# Patient Record
Sex: Female | Born: 1950 | Hispanic: No | Marital: Married | Smoking: Never smoker
Health system: Southern US, Community
[De-identification: ages and names within clinical notes are randomized; demographics above are authoritative.]

## PROBLEM LIST (undated history)

## (undated) DIAGNOSIS — E785 Hyperlipidemia, unspecified: Secondary | ICD-10-CM

## (undated) DIAGNOSIS — R609 Edema, unspecified: Secondary | ICD-10-CM

## (undated) DIAGNOSIS — R062 Wheezing: Secondary | ICD-10-CM

## (undated) DIAGNOSIS — J302 Other seasonal allergic rhinitis: Secondary | ICD-10-CM

## (undated) DIAGNOSIS — I10 Essential (primary) hypertension: Secondary | ICD-10-CM

## (undated) DIAGNOSIS — J45909 Unspecified asthma, uncomplicated: Secondary | ICD-10-CM

## (undated) DIAGNOSIS — I429 Cardiomyopathy, unspecified: Secondary | ICD-10-CM

## (undated) DIAGNOSIS — G4730 Sleep apnea, unspecified: Secondary | ICD-10-CM

## (undated) DIAGNOSIS — K219 Gastro-esophageal reflux disease without esophagitis: Secondary | ICD-10-CM

## (undated) DIAGNOSIS — G5600 Carpal tunnel syndrome, unspecified upper limb: Secondary | ICD-10-CM

## (undated) DIAGNOSIS — R600 Localized edema: Secondary | ICD-10-CM

## (undated) DIAGNOSIS — E119 Type 2 diabetes mellitus without complications: Secondary | ICD-10-CM

## (undated) DIAGNOSIS — R42 Dizziness and giddiness: Secondary | ICD-10-CM

## (undated) DIAGNOSIS — E079 Disorder of thyroid, unspecified: Secondary | ICD-10-CM

## (undated) HISTORY — DX: Type 2 diabetes mellitus without complications: E11.9

## (undated) HISTORY — PX: HERNIA REPAIR: SHX51

## (undated) HISTORY — DX: Disorder of thyroid, unspecified: E07.9

## (undated) HISTORY — DX: Hyperlipidemia, unspecified: E78.5

## (undated) HISTORY — PX: CHOLECYSTECTOMY: SHX55

---

## 2007-07-17 ENCOUNTER — Other Ambulatory Visit: Payer: Self-pay

## 2007-07-17 ENCOUNTER — Inpatient Hospital Stay: Payer: Self-pay | Admitting: Internal Medicine

## 2011-12-13 ENCOUNTER — Other Ambulatory Visit: Payer: Self-pay | Admitting: Internal Medicine

## 2011-12-13 DIAGNOSIS — Z1231 Encounter for screening mammogram for malignant neoplasm of breast: Secondary | ICD-10-CM

## 2012-01-11 ENCOUNTER — Ambulatory Visit
Admission: RE | Admit: 2012-01-11 | Discharge: 2012-01-11 | Disposition: A | Discharge status: Home / Self Care | Payer: Self-pay | Source: Ambulatory Visit | Attending: Internal Medicine | Admitting: Internal Medicine

## 2012-01-11 DIAGNOSIS — Z1231 Encounter for screening mammogram for malignant neoplasm of breast: Secondary | ICD-10-CM

## 2012-01-16 ENCOUNTER — Other Ambulatory Visit: Payer: Self-pay | Admitting: Internal Medicine

## 2012-01-16 DIAGNOSIS — R928 Other abnormal and inconclusive findings on diagnostic imaging of breast: Secondary | ICD-10-CM

## 2012-01-24 ENCOUNTER — Encounter (HOSPITAL_COMMUNITY): Payer: Self-pay | Admitting: *Deleted

## 2012-01-24 ENCOUNTER — Ambulatory Visit
Admission: RE | Admit: 2012-01-24 | Discharge: 2012-01-24 | Disposition: A | Discharge status: Home / Self Care | Payer: Self-pay | Source: Ambulatory Visit | Attending: Internal Medicine | Admitting: Internal Medicine

## 2012-01-24 DIAGNOSIS — R928 Other abnormal and inconclusive findings on diagnostic imaging of breast: Secondary | ICD-10-CM

## 2012-02-02 ENCOUNTER — Encounter (HOSPITAL_COMMUNITY): Payer: Self-pay

## 2012-02-02 ENCOUNTER — Other Ambulatory Visit: Payer: Self-pay | Admitting: Obstetrics and Gynecology

## 2012-02-02 ENCOUNTER — Ambulatory Visit (HOSPITAL_COMMUNITY)
Admission: RE | Admit: 2012-02-02 | Discharge: 2012-02-02 | Disposition: A | Discharge status: Home / Self Care | Payer: Self-pay | Source: Ambulatory Visit | Attending: Obstetrics and Gynecology | Admitting: Obstetrics and Gynecology

## 2012-02-02 DIAGNOSIS — Z01419 Encounter for gynecological examination (general) (routine) without abnormal findings: Secondary | ICD-10-CM

## 2012-02-02 DIAGNOSIS — R928 Other abnormal and inconclusive findings on diagnostic imaging of breast: Secondary | ICD-10-CM

## 2012-02-02 HISTORY — DX: Essential (primary) hypertension: I10

## 2012-02-02 NOTE — Progress Notes (Signed)
Patient referred to [facility] from the [facility] due to needing additional imaging of both breasts. Screening mammogram completed 01/11/2012 at the [facility] of [place].  Pap Smear:    Pap smear completed today. Per patient no history of having a Pap smear and today's Pap smear was her first Pap smear. No Pap smear results in EPIC.  Physical exam: Breasts Breasts symmetrical. No skin abnormalities left breast. Bump on skin on the right breast at 11 o'clock. Per patient that area has been there for a long time and has not changed. No nipple retraction bilateral breasts. No nipple discharge bilateral breasts. No lymphadenopathy. No lumps palpated bilateral breasts. Patient complained of pain in left upper outer breast at the border of the axilla.   Patient referred to the [facility] for bilateral Diagnostic Mammogram per recommendation and possible bilateral breast ultrasound. Appointment scheduled for Tuesday, February 13, 2012 at 0850.             Pelvic/Bimanual   Ext Genitalia No lesions, no swelling and no discharge observed on external genitalia.         Vagina Vagina pink and normal texture. No lesions or discharge observed in vagina.          Cervix Cervix is present. Cervix pink and of normal texture. No discharge observed.     Uterus Uterus is present and palpable. Uterus in normal position and normal size.        Adnexae Bilateral ovaries present and unable to palpate. No tenderness on palpation.          Rectovaginal No rectal exam completed today since patient had no rectal complaints. No skin abnormalities observed on exam.

## 2012-02-02 NOTE — Patient Instructions (Signed)
Taught patient how to perform BSE and gave educational materials to take home. Let her know [facility]CP will cover Pap smears every 3 years unless has a history of abnormal Pap smears but since she has never had a Pap smear before that if today's Pap smear is normal her next Pap smear is due 2-3 years. Patient referred to the [facility] for bilateral Diagnostic Mammogram per recommendation and possible bilateral breast ultrasound. Appointment scheduled for Tuesday, February 13, 2012 at 0850.  Patient aware of appointment and will be there. Let patient know will follow up with her within the next couple weeks with results by letter or phone. Patient verbalized understanding.

## 2012-02-13 ENCOUNTER — Other Ambulatory Visit: Payer: Self-pay | Admitting: Obstetrics and Gynecology

## 2012-02-13 ENCOUNTER — Ambulatory Visit
Admission: RE | Admit: 2012-02-13 | Discharge: 2012-02-13 | Disposition: A | Discharge status: Home / Self Care | Payer: No Typology Code available for payment source | Source: Ambulatory Visit | Attending: Obstetrics and Gynecology | Admitting: Obstetrics and Gynecology

## 2012-02-13 DIAGNOSIS — R928 Other abnormal and inconclusive findings on diagnostic imaging of breast: Secondary | ICD-10-CM

## 2012-02-29 NOTE — Addendum Note (Signed)
Encounter addended by: Saintclair Halsted, RN on: 02/29/2012  4:53 PM<BR>     Documentation filed: Visit Diagnoses, Charges VN

## 2012-04-19 ENCOUNTER — Ambulatory Visit: Payer: Self-pay | Admitting: Internal Medicine

## 2012-04-22 ENCOUNTER — Encounter (HOSPITAL_COMMUNITY): Payer: Self-pay | Admitting: *Deleted

## 2013-12-29 ENCOUNTER — Encounter (HOSPITAL_COMMUNITY): Payer: Self-pay

## 2014-07-01 ENCOUNTER — Emergency Department (INDEPENDENT_AMBULATORY_CARE_PROVIDER_SITE_OTHER): Payer: Self-pay

## 2014-07-01 ENCOUNTER — Emergency Department (INDEPENDENT_AMBULATORY_CARE_PROVIDER_SITE_OTHER)
Admission: EM | Admit: 2014-07-01 | Discharge: 2014-07-01 | Disposition: A | Discharge status: Home / Self Care | Payer: Self-pay | Source: Home / Self Care | Attending: Family Medicine | Admitting: Family Medicine

## 2014-07-01 ENCOUNTER — Encounter (HOSPITAL_COMMUNITY): Payer: Self-pay | Admitting: Emergency Medicine

## 2014-07-01 DIAGNOSIS — M9262 Juvenile osteochondrosis of tarsus, left ankle: Secondary | ICD-10-CM

## 2014-07-01 DIAGNOSIS — M1711 Unilateral primary osteoarthritis, right knee: Secondary | ICD-10-CM | POA: Diagnosis present

## 2014-07-01 NOTE — ED Notes (Signed)
C/o right knee pain onset 1 month and swelling Also reports bilateral foot swelling and right foot numbness Alert, no signs of acute distress.

## 2014-07-01 NOTE — ED Provider Notes (Signed)
Brianna Gray is a 64 y.o. female who presents to Urgent Care today for right knee pain. Patient has a one-month history of medial right knee pain. Pain has occurred without injury. Pain is worse with activity. No radiating pain weakness.   She does note some tingling to her right foot without significant numbness. Additionally she notes swelling at the left posterior calcaneus this is associated with pain and tenderness. This is worse with activity. No fevers or chills nausea vomiting or diarrhea.  She has tried some Tylenol and ibuprofen which helped for her pain. Additionally she's been doing some physical therapy exercises at home that seemed to help some as well.   History reviewed. No pertinent past medical history. History reviewed. No pertinent past surgical history. History  Substance Use Topics  . Smoking status: Never Smoker   . Smokeless tobacco: Not on file  . Alcohol Use: No   ROS as above Medications: No current facility-administered medications for this encounter.   No current outpatient prescriptions on file.   No Known Allergies   Exam:  BP 155/83 mmHg  Pulse 70  Temp(Src) 97 F (36.1 C) (Oral)  Resp 16  SpO2 95% Gen: Well NAD obese HEENT: EOMI,  MMM Lungs: Normal work of breathing. CTABL Heart: RRR no MRG Abd: NABS, Soft. Nondistended, Nontender Exts: Brisk capillary refill, warm and well perfused.  Right knee normal-appearing no effusion Range of motion 0-120 Tender to palpation anterior medial joint line. Stable ligamentous exam. Positive medial McMurray's test. Right foot normal-appearing nontender normal sensation pulses capillary refill  Left knee normal-appearing nontender normal motion Stable ligamentous exam. Negative McMurray's test Left foot swelling of the posterior calcaneus with tenderness. Foot motion is normal Refill pulses and sensation are intact   No results found for this or any previous visit (from the past 24 hour(s)). Dg Knee  Complete 4 Views Right  07/01/2014   CLINICAL DATA:  Right knee pain  EXAM: RIGHT KNEE - COMPLETE 4+ VIEW  COMPARISON:  None.  FINDINGS: Moderate to advanced degenerative change in the medial joint compartment with joint space narrowing and spurring. Mild degenerative change laterally with widening of the joint space. Moderate patellofemoral degenerative change. Small joint effusion  Negative for acute fracture.  IMPRESSION: Moderate to advanced degenerative changes in the knee especially the medial compartment. No acute bony abnormality.   Electronically Signed   By: Franchot Gallo M.D.   On: 07/01/2014 11:36   Dg Foot Complete Left  07/01/2014   CLINICAL DATA:  Pain and swelling heel  EXAM: LEFT FOOT - COMPLETE 3+ VIEW  COMPARISON:  None.  FINDINGS: Negative for fracture or mass.  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 Probable overlying bursitis. Calcification in the plantar surface of the calcaneus also noted.  IMPRESSION: No acute bony abnormality  Extensive calcification of the Achilles tendon insertion and adjacent soft tissues.   Electronically Signed   By: Franchot Gallo M.D.   On: 07/01/2014 11:55    Assessment and Plan: 64 y.o. female with  1) right knee pain: Patient has significant medial compartment DJD.  She is reassured by this diagnosis. She was expecting a diagnosis of arthritis. She would like to defer any corticosteroid shot if possible and do trial of home exercises as well as Tylenol or ibuprofen. She will return as needed 2) left heel swelling: Haglund's deformity. She is again reassured by this diagnosis and will do conservative measures.  Discussed warning signs or symptoms. Please see discharge instructions. Patient expresses understanding.     Gregor Hams, MD 07/01/14 1215

## 2014-07-01 NOTE — Discharge Instructions (Signed)
Thank you for coming in today. Take Tylenol on a regular basis. Use ibuprofen for severe pain Continue leg exercises. We will be happy to do a cortisone shot if your knee pain gets worse  Osteoarthritis Osteoarthritis is a disease that causes soreness and inflammation of a joint. It occurs when the cartilage at the affected joint wears down. Cartilage acts as a cushion, covering the ends of bones where they meet to form a joint. Osteoarthritis is the most common form of arthritis. It often occurs in older people. The joints affected most often by this condition include those in the:  Ends of the fingers.  Thumbs.  Neck.  Lower back.  Knees.  Hips. CAUSES  Over time, the cartilage that covers the ends of bones begins to wear away. This causes bone to rub on bone, producing pain and stiffness in the affected joints.  RISK FACTORS Certain factors can increase your chances of having osteoarthritis, including:  Older age.  Excessive body weight.  Overuse of joints.  Previous joint injury. SIGNS AND SYMPTOMS   Pain, swelling, and stiffness in the joint.  Over time, the joint may lose its normal shape.  Small deposits of bone (osteophytes) may grow on the edges of the joint.  Bits of bone or cartilage can break off and float inside the joint space. This may cause more pain and damage. DIAGNOSIS  Your health care provider will do a physical exam and ask about your symptoms. Various tests may be ordered, such as:  X-rays of the affected joint.  An MRI scan.  Blood tests to rule out other types of arthritis.  Joint fluid tests. This involves using a needle to draw fluid from the joint and examining the fluid under a microscope. TREATMENT  Goals of treatment are to control pain and improve joint function. Treatment plans may include:  A prescribed exercise program that allows for rest and joint relief.  A weight control plan.  Pain relief techniques, such  as:  Properly applied heat and cold.  Electric pulses delivered to nerve endings under the skin (transcutaneous electrical nerve stimulation [TENS]).  Massage.  Certain nutritional supplements.  Medicines to control pain, such as:  Acetaminophen.  Nonsteroidal anti-inflammatory drugs (NSAIDs), such as naproxen.  Narcotic or central-acting agents, such as tramadol.  Corticosteroids. These can be given orally or as an injection.  Surgery to reposition the bones and relieve pain (osteotomy) or to remove loose pieces of bone and cartilage. Joint replacement may be needed in advanced states of osteoarthritis. HOME CARE INSTRUCTIONS   Take medicines only as directed by your health care provider.  Maintain a healthy weight. Follow your health care provider's instructions for weight control. This may include dietary instructions.  Exercise as directed. Your health care provider can recommend specific types of exercise. These may include:  Strengthening exercises. These are done to strengthen the muscles that support joints affected by arthritis. They can be performed with weights or with exercise bands to add resistance.  Aerobic activities. These are exercises, such as brisk walking or low-impact aerobics, that get your heart pumping.  Range-of-motion activities. These keep your joints limber.  Balance and agility exercises. These help you maintain daily living skills.  Rest your affected joints as directed by your health care provider.  Keep all follow-up visits as directed by your health care provider. SEEK MEDICAL CARE IF:   Your skin turns red.  You develop a rash in addition to your joint pain.  You have  worsening joint pain.  You have a fever along with joint or muscle aches. SEEK IMMEDIATE MEDICAL CARE IF:  You have a significant loss of weight or appetite.  You have night sweats. [place] of Arthritis and Musculoskeletal and  Skin Diseases: www.niams.SouthExposed.es  [employer] on Aging: http://kim-miller.com/  American College of Rheumatology: www.rheumatology.org Document Released: 02/13/2005 Document Revised: 06/30/2013 Document Reviewed: 10/21/2012 [facility] Patient Information 2015 [place], [place]. This information is not intended to replace advice given to you by your health care provider. Make sure you discuss any questions you have with your health care provider.

## 2014-07-03 ENCOUNTER — Encounter (HOSPITAL_COMMUNITY): Payer: Self-pay

## 2015-01-04 ENCOUNTER — Other Ambulatory Visit: Payer: Self-pay | Admitting: Internal Medicine

## 2015-01-05 ENCOUNTER — Other Ambulatory Visit: Payer: Self-pay | Admitting: Internal Medicine

## 2015-01-05 DIAGNOSIS — Z1231 Encounter for screening mammogram for malignant neoplasm of breast: Secondary | ICD-10-CM

## 2015-01-14 ENCOUNTER — Ambulatory Visit: Payer: Self-pay | Attending: Internal Medicine

## 2015-01-28 ENCOUNTER — Ambulatory Visit
Admission: RE | Admit: 2015-01-28 | Discharge: 2015-01-28 | Disposition: A | Discharge status: Home / Self Care | Payer: Medicaid Other | Source: Ambulatory Visit | Attending: Internal Medicine | Admitting: Internal Medicine

## 2015-01-28 DIAGNOSIS — Z1231 Encounter for screening mammogram for malignant neoplasm of breast: Secondary | ICD-10-CM | POA: Insufficient documentation

## 2015-03-09 ENCOUNTER — Ambulatory Visit
Admission: RE | Admit: 2015-03-09 | Discharge: 2015-03-09 | Disposition: A | Discharge status: Home / Self Care | Payer: Medicaid Other | Source: Ambulatory Visit | Attending: Obstetrics & Gynecology | Admitting: Obstetrics & Gynecology

## 2015-03-09 ENCOUNTER — Encounter
Admission: RE | Admit: 2015-03-09 | Discharge: 2015-03-09 | Disposition: A | Discharge status: Home / Self Care | Payer: Medicaid Other | Source: Ambulatory Visit | Attending: Obstetrics & Gynecology | Admitting: Obstetrics & Gynecology

## 2015-03-09 DIAGNOSIS — Z9889 Other specified postprocedural states: Secondary | ICD-10-CM | POA: Diagnosis not present

## 2015-03-09 DIAGNOSIS — J45909 Unspecified asthma, uncomplicated: Secondary | ICD-10-CM | POA: Diagnosis not present

## 2015-03-09 DIAGNOSIS — Z01811 Encounter for preprocedural respiratory examination: Secondary | ICD-10-CM

## 2015-03-09 DIAGNOSIS — Z01810 Encounter for preprocedural cardiovascular examination: Secondary | ICD-10-CM | POA: Diagnosis present

## 2015-03-09 DIAGNOSIS — E669 Obesity, unspecified: Secondary | ICD-10-CM | POA: Insufficient documentation

## 2015-03-09 DIAGNOSIS — I10 Essential (primary) hypertension: Secondary | ICD-10-CM | POA: Insufficient documentation

## 2015-03-09 DIAGNOSIS — Z01812 Encounter for preprocedural laboratory examination: Secondary | ICD-10-CM | POA: Diagnosis present

## 2015-03-09 DIAGNOSIS — Z6841 Body Mass Index (BMI) 40.0 and over, adult: Secondary | ICD-10-CM | POA: Diagnosis not present

## 2015-03-09 DIAGNOSIS — N938 Other specified abnormal uterine and vaginal bleeding: Secondary | ICD-10-CM | POA: Diagnosis present

## 2015-03-09 DIAGNOSIS — E039 Hypothyroidism, unspecified: Secondary | ICD-10-CM | POA: Diagnosis not present

## 2015-03-09 HISTORY — DX: Carpal tunnel syndrome, unspecified upper limb: G56.00

## 2015-03-09 HISTORY — DX: Dizziness and giddiness: R42

## 2015-03-09 HISTORY — DX: Hyperlipidemia, unspecified: E78.5

## 2015-03-09 HISTORY — DX: Other seasonal allergic rhinitis: J30.2

## 2015-03-09 HISTORY — DX: Unspecified asthma, uncomplicated: J45.909

## 2015-03-09 HISTORY — DX: Edema, unspecified: R60.9

## 2015-03-09 LAB — BASIC METABOLIC PANEL
ANION GAP: 8 (ref 5–15)
BUN: 16 mg/dL (ref 6–20)
CALCIUM: 9.7 mg/dL (ref 8.9–10.3)
CO2: 26 mmol/L (ref 22–32)
Chloride: 104 mmol/L (ref 101–111)
Creatinine, Ser: 0.74 mg/dL (ref 0.44–1.00)
GLUCOSE: 105 mg/dL — AB (ref 65–99)
POTASSIUM: 3.6 mmol/L (ref 3.5–5.1)
Sodium: 138 mmol/L (ref 135–145)

## 2015-03-09 LAB — CBC
HEMATOCRIT: 38.1 % (ref 35.0–47.0)
Hemoglobin: 12.1 g/dL (ref 12.0–16.0)
MCH: 25.2 pg — ABNORMAL LOW (ref 26.0–34.0)
MCHC: 31.8 g/dL — ABNORMAL LOW (ref 32.0–36.0)
MCV: 79.3 fL — AB (ref 80.0–100.0)
PLATELETS: 258 10*3/uL (ref 150–440)
RBC: 4.81 MIL/uL (ref 3.80–5.20)
RDW: 14.5 % (ref 11.5–14.5)
WBC: 8.9 10*3/uL (ref 3.6–11.0)

## 2015-03-09 LAB — TYPE AND SCREEN
ABO/RH(D): O POS
ANTIBODY SCREEN: NEGATIVE

## 2015-03-09 LAB — ABO/RH: ABO/RH(D): O POS

## 2015-03-09 NOTE — Pre-Procedure Instructions (Signed)
Paged Dr Leonides Schanz to inform her of need for medical clearance.

## 2015-03-09 NOTE — Patient Instructions (Signed)
  Your procedure is scheduled on: 03/19/15 Fri Report to Day Surgery. To find out your arrival time please call [phone] between 1PM - 3PM on 03/18/15 Thurs.  Remember: Instructions that are not followed completely may result in serious medical risk, up to and including death, or upon the discretion of your surgeon and anesthesiologist your surgery may need to be rescheduled.    _x___ 1. Do not eat food or drink liquids after midnight. No gum chewing or hard candies.     ____ 2. No Alcohol for 24 hours before or after surgery.   ____ 3. Bring all medications with you on the day of surgery if instructed.    _x___ 4. Notify your doctor if there is any change in your medical condition     (cold, fever, infections).     Do not wear jewelry, make-up, hairpins, clips or nail polish.  Do not wear lotions, powders, or perfumes. You may wear deodorant.  Do not shave 48 hours prior to surgery. Men may shave face and neck.  Do not bring valuables to the hospital.    [facility] is not responsible for any belongings or valuables.               Contacts, dentures or bridgework may not be worn into surgery.  Leave your suitcase in the car. After surgery it may be brought to your room.  For patients admitted to the hospital, discharge time is determined by your                treatment team.   Patients discharged the day of surgery will not be allowed to drive home.   Please read over the following fact sheets that you were given:      _x___ Take these medicines the morning of surgery with A SIP OF WATER:    1. levothyroxine (SYNTHROID, LEVOTHROID) 75 MCG tablet  2.   3.   4.  5.  6.  ____ Fleet Enema (as directed)   ____ Use CHG Soap as directed  ____ Use inhalers on the day of surgery  ____ Stop metformin 2 days prior to surgery    ____ Take 1/2 of usual insulin dose the night before surgery and none on the morning of surgery.   ____ Stop Coumadin/Plavix/aspirin on  ____  Stop Anti-inflammatories on    ____ Stop supplements until after surgery.    ____ Bring C-Pap to the hospital.

## 2015-03-09 NOTE — Pre-Procedure Instructions (Signed)
AS INSTRUCTED BY DR Ronelle Nigh, REQUEST FOR MEDICAL CLEARANCE REGARDING EKG CALLED AND FAXED TO DR Stephanie Coup. SPOKE WITH BETH. FAXED TO DR C WARD AT [facility] NOTIFIED BY PHONE

## 2015-03-10 NOTE — Pre-Procedure Instructions (Signed)
Cleared by Dr Clayborn Bigness moderate risk 03/09/15. Notified Dr Leonides Schanz

## 2015-03-19 ENCOUNTER — Ambulatory Visit
Admission: RE | Admit: 2015-03-19 | Discharge: 2015-03-19 | Disposition: A | Discharge status: Home / Self Care | Payer: Medicaid Other | Source: Ambulatory Visit | Attending: Obstetrics & Gynecology | Admitting: Obstetrics & Gynecology

## 2015-03-19 ENCOUNTER — Encounter
Admission: RE | Disposition: A | Discharge status: Home / Self Care | Payer: Self-pay | Source: Ambulatory Visit | Attending: Obstetrics & Gynecology

## 2015-03-19 ENCOUNTER — Ambulatory Visit: Payer: Medicaid Other | Admitting: Anesthesiology

## 2015-03-19 ENCOUNTER — Encounter: Payer: Self-pay | Admitting: *Deleted

## 2015-03-19 DIAGNOSIS — E669 Obesity, unspecified: Secondary | ICD-10-CM | POA: Diagnosis not present

## 2015-03-19 DIAGNOSIS — N950 Postmenopausal bleeding: Secondary | ICD-10-CM | POA: Diagnosis present

## 2015-03-19 DIAGNOSIS — Z833 Family history of diabetes mellitus: Secondary | ICD-10-CM | POA: Diagnosis not present

## 2015-03-19 DIAGNOSIS — Z9049 Acquired absence of other specified parts of digestive tract: Secondary | ICD-10-CM | POA: Insufficient documentation

## 2015-03-19 DIAGNOSIS — Z6841 Body Mass Index (BMI) 40.0 and over, adult: Secondary | ICD-10-CM | POA: Diagnosis not present

## 2015-03-19 DIAGNOSIS — N8501 Benign endometrial hyperplasia: Secondary | ICD-10-CM | POA: Insufficient documentation

## 2015-03-19 DIAGNOSIS — R42 Dizziness and giddiness: Secondary | ICD-10-CM | POA: Diagnosis not present

## 2015-03-19 DIAGNOSIS — G5600 Carpal tunnel syndrome, unspecified upper limb: Secondary | ICD-10-CM | POA: Insufficient documentation

## 2015-03-19 DIAGNOSIS — R938 Abnormal findings on diagnostic imaging of other specified body structures: Secondary | ICD-10-CM | POA: Diagnosis not present

## 2015-03-19 DIAGNOSIS — Z8249 Family history of ischemic heart disease and other diseases of the circulatory system: Secondary | ICD-10-CM | POA: Insufficient documentation

## 2015-03-19 DIAGNOSIS — N840 Polyp of corpus uteri: Secondary | ICD-10-CM | POA: Insufficient documentation

## 2015-03-19 DIAGNOSIS — J45909 Unspecified asthma, uncomplicated: Secondary | ICD-10-CM | POA: Insufficient documentation

## 2015-03-19 DIAGNOSIS — I10 Essential (primary) hypertension: Secondary | ICD-10-CM | POA: Diagnosis not present

## 2015-03-19 HISTORY — PX: HYSTEROSCOPY WITH D & C: SHX1775

## 2015-03-19 SURGERY — DILATATION AND CURETTAGE /HYSTEROSCOPY
Anesthesia: General | Wound class: Clean Contaminated

## 2015-03-19 MED ORDER — FENTANYL CITRATE (PF) 100 MCG/2ML IJ SOLN
INTRAMUSCULAR | Status: DC | PRN
  Administered 2015-03-19: 50 ug via INTRAVENOUS

## 2015-03-19 MED ORDER — FAMOTIDINE 20 MG PO TABS
20.0000 mg | ORAL_TABLET | Freq: Once | ORAL | Status: AC
  Administered 2015-03-19: 20 mg via ORAL

## 2015-03-19 MED ORDER — FENTANYL CITRATE (PF) 100 MCG/2ML IJ SOLN: 25.0000 ug | INTRAMUSCULAR | Status: DC | PRN

## 2015-03-19 MED ORDER — LACTATED RINGERS IV SOLN
INTRAVENOUS | Status: DC
  Administered 2015-03-19: 11:00:00 via INTRAVENOUS

## 2015-03-19 MED ORDER — LIDOCAINE HCL (CARDIAC) 20 MG/ML IV SOLN
INTRAVENOUS | Status: DC | PRN
  Administered 2015-03-19: 80 mg via INTRAVENOUS

## 2015-03-19 MED ORDER — SUCCINYLCHOLINE CHLORIDE 20 MG/ML IJ SOLN
INTRAMUSCULAR | Status: DC | PRN
  Administered 2015-03-19: 100 mg via INTRAVENOUS

## 2015-03-19 MED ORDER — ROCURONIUM BROMIDE 100 MG/10ML IV SOLN
INTRAVENOUS | Status: DC | PRN
  Administered 2015-03-19: 5 mg via INTRAVENOUS

## 2015-03-19 MED ORDER — FAMOTIDINE 20 MG PO TABS
ORAL_TABLET | ORAL | Status: AC
  Administered 2015-03-19: 20 mg via ORAL
  Filled 2015-03-19: qty 1

## 2015-03-19 MED ORDER — MIDAZOLAM HCL 2 MG/2ML IJ SOLN
INTRAMUSCULAR | Status: DC | PRN
  Administered 2015-03-19: 2 mg via INTRAVENOUS

## 2015-03-19 MED ORDER — LIDOCAINE HCL (PF) 1 % IJ SOLN
INTRAMUSCULAR | Status: AC
  Filled 2015-03-19: qty 30

## 2015-03-19 MED ORDER — ONDANSETRON HCL 4 MG/2ML IJ SOLN: 4.0000 mg | Freq: Once | INTRAMUSCULAR | Status: DC | PRN

## 2015-03-19 MED ORDER — PROPOFOL 10 MG/ML IV BOLUS
INTRAVENOUS | Status: DC | PRN
  Administered 2015-03-19: 160 mg via INTRAVENOUS

## 2015-03-19 SURGICAL SUPPLY — 19 items
CATH ROBINSON RED A/P 16FR (CATHETERS) ×2 IMPLANT
CORD URO TURP 10FT (MISCELLANEOUS) IMPLANT
ELECT REM PT RETURN 9FT ADLT (ELECTROSURGICAL) ×2
ELECT RESECT POWERBALL 24F (MISCELLANEOUS) IMPLANT
ELECTRODE REM PT RTRN 9FT ADLT (ELECTROSURGICAL) ×1 IMPLANT
GLOVE BIOGEL PI IND STRL 6.5 (GLOVE) ×1 IMPLANT
GLOVE BIOGEL PI INDICATOR 6.5 (GLOVE) ×1
GLOVE SURG SYN 6.5 ES PF (GLOVE) ×2 IMPLANT
GOWN STRL REUS W/ TWL LRG LVL3 (GOWN DISPOSABLE) ×2 IMPLANT
GOWN STRL REUS W/TWL LRG LVL3 (GOWN DISPOSABLE) ×2
IV LACTATED RINGERS 1000ML (IV SOLUTION) ×2 IMPLANT
KIT RM TURNOVER CYSTO AR (KITS) ×2 IMPLANT
NEEDLE SPNL 22GX3.5 QUINCKE BK (NEEDLE) IMPLANT
PACK DNC HYST (MISCELLANEOUS) ×2 IMPLANT
PAD OB MATERNITY 4.3X12.25 (PERSONAL CARE ITEMS) ×2 IMPLANT
PAD PREP 24X41 OB/GYN DISP (PERSONAL CARE ITEMS) ×2 IMPLANT
SYRINGE 10CC LL (SYRINGE) ×2 IMPLANT
TUBING CONNECTING 10 (TUBING) ×2 IMPLANT
TUBING HYSTEROSCOPY DOLPHIN (MISCELLANEOUS) IMPLANT

## 2015-03-19 NOTE — Transfer of Care (Signed)
Immediate Anesthesia Transfer of Care Note  Patient: Brianna Gray  Procedure(s) Performed: Procedure(s): DILATATION AND CURETTAGE /HYSTEROSCOPY and polypectomy (N/A)  Patient Location: PACU  Anesthesia Type:General  Level of Consciousness: sedated  Airway & Oxygen Therapy: Patient Spontanous Breathing and Patient connected to face mask oxygen  Post-op Assessment: Report given to RN and Post -op Vital signs reviewed and stable  Post vital signs: Reviewed and stable  Last Vitals:  Filed Vitals:   03/19/15 1106 03/19/15 1313  BP: 141/77 111/66  Pulse: 65 56  Temp: 36.4 C 36.4 C  Resp: 18 16    Complications: No apparent anesthesia complications

## 2015-03-19 NOTE — H&P (Signed)
H&P Update  PLEASE SEE PAPER H&P  Pt was last seen in my office, and complete history and physical performed.  The surgical history has been reviewed and remains accurate without interval change. The patient was re-examined and patient's physiologic condition has not changed significantly in the last 30 days.  No new pharmacological allergies or types of therapy has been initiated.  She has received clearance from her primary care provider and anesthesia.  No Known Allergies  Past Medical History  Diagnosis Date  . Hypertension   . Elevated lipids   . Seasonal allergies   . Edema   . Asthma   . Carpal tunnel syndrome   . Dizziness    Past Surgical History  Procedure Laterality Date  . Cholecystectomy    . Hernia repair    . Hernia repair      BP 141/77 mmHg  Pulse 65  Temp(Src) 97.5 F (36.4 C) (Oral)  Resp 18  Ht 5\' 3"  (1.6 m)  Wt 116.121 kg (256 lb)  BMI 45.36 kg/m2  SpO2 98%  NAD RRR no murmurs CTAB, no wheezing, resps unlabored +BS, soft, NTTP No c/c/e Pelvic exam deferred  The above history was confirmed with the patient. The condition still exists that makes this procedure necessary. Surgical plan includes hysteroscopy, D&C, as confirmed on the consent. The treatment plan remains the same, without new options for care.  The patient understands the potential benefits and risks and the consents have been signed and placed on the chart.     Larey Days, MD Attending Obstetrician Gynecologist [facility]

## 2015-03-19 NOTE — Op Note (Signed)
Operative Report Hysteroscopy, Dilation and Curettage 03/19/2015  Patient:  Brianna Gray  65 y.o. female Preoperative diagnosis:  THICKENED ENDOMETRIUM,POSTMENOPAUSAL BLEEDING Postoperative diagnosis:  Postmenopausal bleeding and Endometrial polyps  PROCEDURE:  Procedure(s): DILATATION AND CURETTAGE /HYSTEROSCOPY and polypectomy (N/A) Surgeon:  Surgeon(s) and Role:    * Londell Noll Loletha Grayer Azarion Hove, MD - Primary Assistant: n/a Anesthesia:  GET I/O: Total I/O In: 300 [I.V.:300] Out: - minimal, 0 UOP  Specimens:  Endometrial curettings, endometrial polyp Complications: None Apparent Disposition:  VS stable to PACU  Findings: Uterus, mobile, top-normal size, sounding to 11cm; normal cervix, vagina, perineum.  2 Endometrial polyps, one anterior left and one posterior right  Indication for procedure/Consents: 65 y.o. [id number]  here for scheduled surgery for the aforementioned diagnoses.  Risks of surgery were discussed with the patient including but not limited to: bleeding which may require transfusion; infection which may require antibiotics; injury to uterus or surrounding organs; intrauterine scarring which may impair future fertility; need for additional procedures including laparotomy or laparoscopy; and other postoperative/anesthesia complications. Written informed consent was obtained.    Procedure Details:   The patient was then taken to the operating room where anesthesia was administered and was found to be adequate.  After a formal and adequate timeout was performed, she was placed in the dorsal lithotomy position and examined with the above findings. She was then prepped and draped in the sterile manner.  A speculum was then placed in the patient's vagina and a single tooth tenaculum was applied to the anterior lip of the cervix.    The uterus was sounded to 11 cm. Her cervix was serially dilated to accommodate the hysteroscope, with findings as above. A polyp forceps was placed into the  endometrial cavity and the polyps were grasped and removed without difficulty.  A sharp curettage was then performed until there was a gritty texture in all four quadrants. The specimens were handed off to nursing.  The camera was reinserted and confirmed the uterus had been evacuated. The tenaculum was removed from the anterior lip of the cervix and the vaginal speculum was removed after noting good hemostasis. The patient tolerated the procedure well and was taken to the recovery area awake, extubated and in stable condition.  The patient will be discharged to home as per PACU criteria.  Routine postoperative instructions given. She will follow up in the clinic in three to four weeks for postoperative evaluation.  Larey Days, MD [facility] Attending Gynecologist

## 2015-03-19 NOTE — Progress Notes (Signed)
Interpreter URD here for pre op Riz.

## 2015-03-19 NOTE — Anesthesia Preprocedure Evaluation (Addendum)
Anesthesia Evaluation  Patient identified by MRN, date of birth, ID band Patient awake    Reviewed: Allergy & Precautions, NPO status , Patient's Chart, lab work & pertinent test results  History of Anesthesia Complications Negative for: history of anesthetic complications  Airway Mallampati: III       Dental  (+) Chipped   Pulmonary neg pulmonary ROS, asthma ,           Cardiovascular hypertension, Pt. on medications      Neuro/Psych negative neurological ROS     GI/Hepatic negative GI ROS, Neg liver ROS,   Endo/Other  Hypothyroidism   Renal/GU negative Renal ROS     Musculoskeletal  (+) Arthritis ,   Abdominal   Peds  Hematology negative hematology ROS (+)   Anesthesia Other Findings   Reproductive/Obstetrics                            Anesthesia Physical Anesthesia Plan  ASA: III  Anesthesia Plan: General   Post-op Pain Management:    Induction: Intravenous  Airway Management Planned: LMA  Additional Equipment:   Intra-op Plan:   Post-operative Plan:   Informed Consent: I have reviewed the patients History and Physical, chart, labs and discussed the procedure including the risks, benefits and alternatives for the proposed anesthesia with the patient or authorized representative who has indicated his/her understanding and acceptance.     Plan Discussed with:   Anesthesia Plan Comments:         Anesthesia Quick Evaluation

## 2015-03-19 NOTE — Discharge Instructions (Signed)
You should expect to have some cramping and vaginal bleeding for about a week. This should taper off and subside, much like a period. If heavy bleeding continues or gets worse, you should contact the office for an earlier appointment.   Please call the office or physician on call for fever >101, severe pain, and heavy bleeding.   Holladay!!  AMBULATORY SURGERY  DISCHARGE INSTRUCTIONS   1) The drugs that you were given will stay in your system until tomorrow so for the next 24 hours you should not:  A) Drive an automobile B) Make any legal decisions C) Drink any alcoholic beverage   2) You may resume regular meals tomorrow.  Today it is better to start with liquids and gradually work up to solid foods.  You may eat anything you prefer, but it is better to start with liquids, then soup and crackers, and gradually work up to solid foods.   3) Please notify your doctor immediately if you have any unusual bleeding, trouble breathing, redness and pain at the surgery site, drainage, fever, or pain not relieved by medication.    4) Additional Instructions:        Please contact your physician with any problems or Same Day Surgery at [phone], Monday through Friday 6 am to 4 pm, or [place] at [facility] number at [phone].AMBULATORY SURGERY  DISCHARGE INSTRUCTIONS   5) The drugs that you were given will stay in your system until tomorrow so for the next 24 hours you should not:  D) Drive an automobile E) Make any legal decisions F) Drink any alcoholic beverage   6) You may resume regular meals tomorrow.  Today it is better to start with liquids and gradually work up to solid foods.  You may eat anything you prefer, but it is better to start with liquids, then soup and crackers, and gradually work up to solid foods.   7) Please notify your doctor immediately if you have any unusual bleeding, trouble breathing, redness and  pain at the surgery site, drainage, fever, or pain not relieved by medication.    8) Additional Instructions:        Please contact your physician with any problems or Same Day Surgery at [phone], Monday through Friday 6 am to 4 pm, or [place] at [facility] number at [phone].

## 2015-03-19 NOTE — Anesthesia Postprocedure Evaluation (Signed)
Anesthesia Post Note  Patient: Brianna Gray  Procedure(s) Performed: Procedure(s) (LRB): DILATATION AND CURETTAGE /HYSTEROSCOPY and polypectomy (N/A)  Patient location during evaluation: PACU Anesthesia Type: General Level of consciousness: awake and alert and oriented Pain management: pain level controlled Vital Signs Assessment: post-procedure vital signs reviewed and stable Respiratory status: spontaneous breathing Cardiovascular status: blood pressure returned to baseline Anesthetic complications: no    Last Vitals:  Filed Vitals:   03/19/15 1106 03/19/15 1313  BP: 141/77 111/66  Pulse: 65 56  Temp: 36.4 C 36.4 C  Resp: 18 16    Last Pain:  Filed Vitals:   03/19/15 1321  PainSc: Asleep                 Jahna Liebert

## 2015-03-20 ENCOUNTER — Encounter: Payer: Self-pay | Admitting: Obstetrics & Gynecology

## 2015-03-23 LAB — SURGICAL PATHOLOGY

## 2016-01-11 ENCOUNTER — Other Ambulatory Visit: Payer: Self-pay | Admitting: Internal Medicine

## 2016-01-11 DIAGNOSIS — Z1231 Encounter for screening mammogram for malignant neoplasm of breast: Secondary | ICD-10-CM

## 2016-02-22 ENCOUNTER — Ambulatory Visit: Payer: Medicaid Other

## 2016-03-21 ENCOUNTER — Ambulatory Visit
Admission: RE | Admit: 2016-03-21 | Discharge: 2016-03-21 | Disposition: A | Discharge status: Home / Self Care | Payer: Medicaid Other | Source: Ambulatory Visit | Attending: Internal Medicine | Admitting: Internal Medicine

## 2016-03-21 DIAGNOSIS — Z1231 Encounter for screening mammogram for malignant neoplasm of breast: Secondary | ICD-10-CM

## 2016-05-02 ENCOUNTER — Telehealth: Payer: Self-pay | Admitting: Obstetrics & Gynecology

## 2016-05-02 NOTE — Telephone Encounter (Signed)
Patient is aware of H&P appointment with Dr Kenton Kingfisher on 05/24/16 @ 8:20am with a Pre-Admit Testing appointment afterwards, and surgery on 06/08/16 via [facility], [phone].

## 2016-05-19 ENCOUNTER — Telehealth: Payer: Self-pay | Admitting: Obstetrics & Gynecology

## 2016-05-19 NOTE — Telephone Encounter (Signed)
Beth from [place] is calling about the Status for Pt surgery. Please advise. BC# [phone]

## 2016-05-22 NOTE — Telephone Encounter (Signed)
Per [facility] @ Dr Laurelyn Sickle office, they did an ECHO which was abnormal, and are ordering an overnight oximetry test. Brianna Gray will have the orders signed today and should be able to have the equipment delivered this week. Beth to fax ECHO results.

## 2016-05-22 NOTE — Telephone Encounter (Signed)
OK 

## 2016-05-24 ENCOUNTER — Encounter
Admission: RE | Admit: 2016-05-24 | Discharge: 2016-05-24 | Disposition: A | Discharge status: Home / Self Care | Payer: Medicaid Other | Source: Ambulatory Visit | Attending: Obstetrics & Gynecology | Admitting: Obstetrics & Gynecology

## 2016-05-24 ENCOUNTER — Ambulatory Visit
Admission: RE | Admit: 2016-05-24 | Discharge: 2016-05-24 | Disposition: A | Discharge status: Home / Self Care | Payer: Medicaid Other | Source: Ambulatory Visit | Attending: Obstetrics & Gynecology | Admitting: Obstetrics & Gynecology

## 2016-05-24 ENCOUNTER — Encounter: Payer: Self-pay | Admitting: Obstetrics & Gynecology

## 2016-05-24 ENCOUNTER — Ambulatory Visit (INDEPENDENT_AMBULATORY_CARE_PROVIDER_SITE_OTHER): Payer: Medicaid Other | Admitting: Obstetrics & Gynecology

## 2016-05-24 VITALS — BP 138/80 | HR 70 | Ht 63.0 in | Wt 275.0 lb

## 2016-05-24 DIAGNOSIS — I517 Cardiomegaly: Secondary | ICD-10-CM | POA: Insufficient documentation

## 2016-05-24 DIAGNOSIS — Z01818 Encounter for other preprocedural examination: Secondary | ICD-10-CM | POA: Diagnosis not present

## 2016-05-24 DIAGNOSIS — N950 Postmenopausal bleeding: Secondary | ICD-10-CM | POA: Diagnosis not present

## 2016-05-24 DIAGNOSIS — Z01810 Encounter for preprocedural cardiovascular examination: Secondary | ICD-10-CM | POA: Insufficient documentation

## 2016-05-24 DIAGNOSIS — I700 Atherosclerosis of aorta: Secondary | ICD-10-CM | POA: Insufficient documentation

## 2016-05-24 DIAGNOSIS — R9431 Abnormal electrocardiogram [ECG] [EKG]: Secondary | ICD-10-CM | POA: Insufficient documentation

## 2016-05-24 DIAGNOSIS — N8500 Endometrial hyperplasia, unspecified: Secondary | ICD-10-CM | POA: Diagnosis not present

## 2016-05-24 HISTORY — DX: Gastro-esophageal reflux disease without esophagitis: K21.9

## 2016-05-24 HISTORY — DX: Wheezing: R06.2

## 2016-05-24 HISTORY — DX: Localized edema: R60.0

## 2016-05-24 HISTORY — DX: Sleep apnea, unspecified: G47.30

## 2016-05-24 HISTORY — DX: Cardiomyopathy, unspecified: I42.9

## 2016-05-24 LAB — BASIC METABOLIC PANEL
ANION GAP: 6 (ref 5–15)
BUN: 17 mg/dL (ref 6–20)
CALCIUM: 9.7 mg/dL (ref 8.9–10.3)
CO2: 29 mmol/L (ref 22–32)
Chloride: 104 mmol/L (ref 101–111)
Creatinine, Ser: 0.77 mg/dL (ref 0.44–1.00)
GFR calc Af Amer: >60 mL/min (ref >60)
GFR calc non Af Amer: >60 mL/min (ref >60)
GLUCOSE: 102 mg/dL — AB (ref 65–99)
Potassium: 3.6 mmol/L (ref 3.5–5.1)
Sodium: 139 mmol/L (ref 135–145)

## 2016-05-24 LAB — CBC
HEMATOCRIT: 36.5 % (ref 35.0–47.0)
Hemoglobin: 11.6 g/dL — ABNORMAL LOW (ref 12.0–16.0)
MCH: 24.4 pg — AB (ref 26.0–34.0)
MCHC: 31.8 g/dL — ABNORMAL LOW (ref 32.0–36.0)
MCV: 76.9 fL — AB (ref 80.0–100.0)
Platelets: 267 10*3/uL (ref 150–440)
RBC: 4.74 MIL/uL (ref 3.80–5.20)
RDW: 15 % — AB (ref 11.5–14.5)
WBC: 9.1 10*3/uL (ref 3.6–11.0)

## 2016-05-24 LAB — TYPE AND SCREEN
ABO/RH(D): O POS
Antibody Screen: NEGATIVE

## 2016-05-24 LAB — PROTIME-INR
INR: 1.08
Prothrombin Time: 14 seconds (ref 11.4–15.2)

## 2016-05-24 LAB — APTT: APTT: 34 s (ref 24–36)

## 2016-05-24 NOTE — Progress Notes (Signed)
PRE-OPERATIVE HISTORY AND PHYSICAL EXAM  HPI:  Brianna Gray is a 66 y.o. [id number]. Patient is postmenopausal with PMB and concern for cancer.  She is being admitted for hysterectomy.  Since stopping Megace Jan 8 she has had worse bleeding, light for one week then heavier. No pain, hot flashes, rectal bleeding, n/v/d/f/c.  Her [place] , EMB, and surgery in the past revealed benign findings although it did show Endometrial Hyperplasia, as well as endoemtrial polyps, and mildly thickened endometrium. US done reveals 2 small fibroids (ant, post; approx 2 cm) and 77mm ES.   PMHx: She  has a past medical history of Asthma; Carpal tunnel syndrome; Dizziness; Edema; Elevated lipids; Hypertension; and Seasonal allergies. Also,  has a past surgical history that includes Cholecystectomy; Hernia repair; Hernia repair; and Hysteroscopy w/D&C (N/A, 03/19/2015)., family history includes Diabetes in her brother; Hypertension in her brother.,  reports that she has never smoked. Her smokeless tobacco use includes Snuff. She reports that she does not drink alcohol or use drugs.  She has a current medication list which includes the following prescription(s): advair diskus, atorvastatin, fluticasone, furosemide, levothyroxine, loratadine, losartan-hydrochlorothiazide, meclizine, medroxyprogesterone, meloxicam, naphazoline-glycerin, nexium, potassium, and vitamin b-12. Also, has No Known Allergies.  Review of Systems  Constitutional: Negative for chills, fever and malaise/fatigue.  HENT: Negative for congestion, sinus pain and sore throat.   Eyes: Negative for blurred vision and pain.  Respiratory: Negative for cough and wheezing.   Cardiovascular: Negative for chest pain and leg swelling.  Gastrointestinal: Negative for abdominal pain, constipation, diarrhea, heartburn, nausea and vomiting.  Genitourinary: Negative for dysuria, frequency, hematuria and urgency.  Musculoskeletal: Negative for back pain, joint pain,  myalgias and neck pain.  Skin: Negative for itching and rash.  Neurological: Negative for dizziness, tremors and weakness.  Endo/Heme/Allergies: Does not bruise/bleed easily.  Psychiatric/Behavioral: Negative for depression. The patient is not nervous/anxious and does not have insomnia.     Objective: BP 138/80   Pulse 70   Ht 5\' 3"  (1.6 m)   Wt 275 lb (124.7 kg)   BMI 48.71 kg/m  Filed Weights   05/24/16 0843  Weight: 275 lb (124.7 kg)   Physical Exam  Constitutional: She is oriented to person, place, and time. She appears well-developed and well-nourished. No distress.  Genitourinary: Rectum normal, vagina normal and uterus normal. Pelvic exam was performed with patient supine. There is no rash or lesion on the right labia. There is no rash or lesion on the left labia. Vagina exhibits no lesion. No bleeding in the vagina. Right adnexum does not display mass and does not display tenderness. Left adnexum does not display mass and does not display tenderness. Cervix does not exhibit motion tenderness, lesion, friability or polyp.   Uterus is mobile and midaxial. Uterus is not enlarged or exhibiting a mass.  HENT:  Head: Normocephalic and atraumatic. Head is without laceration.  Right Ear: Hearing normal.  Left Ear: Hearing normal.  Nose: No epistaxis.  No foreign bodies.  Mouth/Throat: Uvula is midline, oropharynx is clear and moist and mucous membranes are normal.  Eyes: Pupils are equal, round, and reactive to light.  Neck: Normal range of motion. Neck supple. No thyromegaly present.  Cardiovascular: Normal rate and regular rhythm.  Exam reveals no gallop and no friction rub.   No murmur heard. Pulmonary/Chest: Effort normal and breath sounds normal. No respiratory distress. She has no wheezes.  Abdominal: Soft. Bowel sounds are normal. She exhibits no distension. There is no  tenderness. There is no rebound.  Musculoskeletal: Normal range of motion.  Neurological: She is alert  and oriented to person, place, and time. No cranial nerve deficit.  Skin: Skin is warm and dry.  Psychiatric: She has a normal mood and affect. Judgment normal.  Vitals reviewed.  Patient denies any other pertinent gynecologic issues. See prenatal record for more complete H&P  Assessment: 1. Post-menopausal bleeding   2. Endometrial hyperplasia     PLAN: 1.  Postmenopausal Bleeding, Endometrial Hyperplasia  I have had a careful discussion with this patient about all the options available and the risk/benefits of each. I have fully informed this patient that a hysterectomy may subject her to a variety of discomforts and risks: She understands that most patients have surgery with little difficulty, but problems can happen ranging from minor to fatal. These include nausea, vomiting, pain, bleeding, infection, poor healing, hernia, or formation of adhesions. Unexpected reactions may occur from any drug or anesthetic given. Unintended injury may occur to other pelvic or abdominal structures such as Fallopian tubes, ovaries, bladder, ureter (tube from kidney to bladder), or bowel. Nerves going from the pelvis to the legs may be injured. Any such injury may require immediate or later additional surgery to correct the problem. Excessive blood loss requiring transfusion is very unlikely but possible. Dangerous blood clots may form in the legs or lungs. Physical and sexual activity will be restricted in varying degrees for an indeterminate period of time but most often 2-4 weeks. She understands that the plan is to do this laparoscopically, however, there is a chance that this will need to be performed via a larger incision. She will most likely be hospitalized overnight. Finally, she understands that it is impossible to list every possible undesirable effect and that the condition for which surgery is done is not always cured or significantly improved, and in rare cases may be even worse. I have also  counseled her extensively about the pros and cons of ovarian conservation versus removal. Ample time was given to answer all questions.  Risks of obesity and comorbid conditions discussed. Pt sees Dr Humphrey Rolls for medical attention.  Barnett Applebaum, M.D. 05/24/2016 8:58 AM

## 2016-05-24 NOTE — Patient Instructions (Signed)
Your procedure is scheduled on: 06/08/16 Thurs Report to Same Day Surgery 2nd floor medical mall [facility] Entrance-take elevator on left to 2nd floor.  Check in with surgery information desk.) To find out your arrival time please call [phone] between 1PM - 3PM on 06/07/16 Wed  Remember: Instructions that are not followed completely may result in serious medical risk, up to and including death, or upon the discretion of your surgeon and anesthesiologist your surgery may need to be rescheduled.    _x___ 1. Do not eat food or drink liquids after midnight. No gum chewing or                              hard candies.     __x__ 2. No Alcohol for 24 hours before or after surgery.   __x__3. No Smoking for 24 prior to surgery.   ____  4. Bring all medications with you on the day of surgery if instructed.    __x__ 5. Notify your doctor if there is any change in your medical condition     (cold, fever, infections).     Do not wear jewelry, make-up, hairpins, clips or nail polish.  Do not wear lotions, powders, or perfumes. You may wear deodorant.  Do not shave 48 hours prior to surgery. Men may shave face and neck.  Do not bring valuables to the hospital.    [facility] is not responsible for any belongings or valuables.               Contacts, dentures or bridgework may not be worn into surgery.  Leave your suitcase in the car. After surgery it may be brought to your room.  For patients admitted to the hospital, discharge time is determined by your                       treatment team.   Patients discharged the day of surgery will not be allowed to drive home.  You will need someone to drive you home and stay with you the night of your procedure.    Please read over the following fact sheets that you were given:   [facility] Preparing for Surgery and or MRSA Information   _x___ Take anti-hypertensive (unless it includes a diuretic), cardiac, seizure, asthma,     anti-reflux and  psychiatric medicines. These include:  1. ADVAIR DISKUS   2.atorvastatin (LIPITOR  3.levothyroxine (SYNTHROID  4.NEXIUM 40 MG capsule  5.  6.  ____Fleets enema or Magnesium Citrate as directed.   _x___ Use CHG Soap or sage wipes as directed on instruction sheet   ____ Use inhalers on the day of surgery and bring to hospital day of surgery  ____ Stop Metformin and Janumet 2 days prior to surgery.    ____ Take 1/2 of usual insulin dose the night before surgery and none on the morning     surgery.   _x___ Follow recommendations from Cardiologist, Pulmonologist or PCP regarding          stopping Aspirin, Coumadin, Pllavix ,Eliquis, Effient, or Pradaxa, and Pletal.  X____Stop Anti-inflammatories such as Advil, Aleve, Ibuprofen, Motrin, Naproxen, Naprosyn, Goodies powders or aspirin products.Stop Meloxicam 1 week before surgery. OK to take Tylenol and                          Celebrex.   _x___ Stop  supplements until after surgery.  But may continue Vitamin D, Vitamin B,       and multivitamin.   ____ Bring C-Pap to the hospital.

## 2016-05-25 NOTE — Pre-Procedure Instructions (Signed)
Interpreter at pre op visit.

## 2016-06-07 MED ORDER — CEFOXITIN SODIUM-DEXTROSE 2-2.2 GM-% IV SOLR (PREMIX)
2.0000 g | Freq: Once | INTRAVENOUS | Status: AC
  Administered 2016-06-08: 2000 mg via INTRAVENOUS

## 2016-06-08 ENCOUNTER — Ambulatory Visit: Payer: Medicaid Other | Admitting: Anesthesiology

## 2016-06-08 ENCOUNTER — Encounter
Admission: RE | Disposition: A | Discharge status: Home / Self Care | Payer: Self-pay | Source: Ambulatory Visit | Attending: Obstetrics & Gynecology

## 2016-06-08 ENCOUNTER — Encounter: Payer: Self-pay | Admitting: *Deleted

## 2016-06-08 ENCOUNTER — Observation Stay
Admission: RE | Admit: 2016-06-08 | Discharge: 2016-06-09 | Disposition: A | Discharge status: Home / Self Care | Payer: Medicaid Other | Source: Ambulatory Visit | Attending: Obstetrics & Gynecology | Admitting: Obstetrics & Gynecology

## 2016-06-08 DIAGNOSIS — Z7951 Long term (current) use of inhaled steroids: Secondary | ICD-10-CM | POA: Diagnosis not present

## 2016-06-08 DIAGNOSIS — G4730 Sleep apnea, unspecified: Secondary | ICD-10-CM | POA: Diagnosis not present

## 2016-06-08 DIAGNOSIS — I429 Cardiomyopathy, unspecified: Secondary | ICD-10-CM | POA: Insufficient documentation

## 2016-06-08 DIAGNOSIS — N8500 Endometrial hyperplasia, unspecified: Secondary | ICD-10-CM | POA: Diagnosis present

## 2016-06-08 DIAGNOSIS — N838 Other noninflammatory disorders of ovary, fallopian tube and broad ligament: Secondary | ICD-10-CM | POA: Insufficient documentation

## 2016-06-08 DIAGNOSIS — I10 Essential (primary) hypertension: Secondary | ICD-10-CM | POA: Diagnosis not present

## 2016-06-08 DIAGNOSIS — N72 Inflammatory disease of cervix uteri: Secondary | ICD-10-CM | POA: Insufficient documentation

## 2016-06-08 DIAGNOSIS — F17290 Nicotine dependence, other tobacco product, uncomplicated: Secondary | ICD-10-CM | POA: Diagnosis not present

## 2016-06-08 DIAGNOSIS — Z6841 Body Mass Index (BMI) 40.0 and over, adult: Secondary | ICD-10-CM | POA: Diagnosis not present

## 2016-06-08 DIAGNOSIS — K219 Gastro-esophageal reflux disease without esophagitis: Secondary | ICD-10-CM | POA: Diagnosis not present

## 2016-06-08 DIAGNOSIS — D259 Leiomyoma of uterus, unspecified: Secondary | ICD-10-CM | POA: Diagnosis not present

## 2016-06-08 DIAGNOSIS — Z79899 Other long term (current) drug therapy: Secondary | ICD-10-CM | POA: Insufficient documentation

## 2016-06-08 DIAGNOSIS — N800 Endometriosis of uterus: Secondary | ICD-10-CM

## 2016-06-08 DIAGNOSIS — Z791 Long term (current) use of non-steroidal anti-inflammatories (NSAID): Secondary | ICD-10-CM | POA: Insufficient documentation

## 2016-06-08 DIAGNOSIS — N8501 Benign endometrial hyperplasia: Secondary | ICD-10-CM | POA: Diagnosis present

## 2016-06-08 DIAGNOSIS — N840 Polyp of corpus uteri: Secondary | ICD-10-CM | POA: Diagnosis not present

## 2016-06-08 DIAGNOSIS — J45909 Unspecified asthma, uncomplicated: Secondary | ICD-10-CM | POA: Insufficient documentation

## 2016-06-08 DIAGNOSIS — N950 Postmenopausal bleeding: Secondary | ICD-10-CM | POA: Diagnosis present

## 2016-06-08 DIAGNOSIS — E039 Hypothyroidism, unspecified: Secondary | ICD-10-CM | POA: Diagnosis not present

## 2016-06-08 HISTORY — PX: LAPAROTOMY: SHX154

## 2016-06-08 HISTORY — PX: LAPAROSCOPIC HYSTERECTOMY: SHX1926

## 2016-06-08 HISTORY — PX: LAPAROSCOPIC SALPINGO OOPHERECTOMY: SHX5927

## 2016-06-08 SURGERY — HYSTERECTOMY, TOTAL, LAPAROSCOPIC
Anesthesia: General | Wound class: Clean Contaminated

## 2016-06-08 MED ORDER — ACETAMINOPHEN 325 MG PO TABS: 650.0000 mg | ORAL_TABLET | ORAL | Status: DC | PRN

## 2016-06-08 MED ORDER — PANTOPRAZOLE SODIUM 40 MG PO TBEC
40.0000 mg | DELAYED_RELEASE_TABLET | Freq: Every day | ORAL | Status: DC
  Administered 2016-06-08 – 2016-06-09 (×2): 40 mg via ORAL
  Filled 2016-06-08 (×2): qty 1

## 2016-06-08 MED ORDER — DOCUSATE SODIUM 100 MG PO CAPS
100.0000 mg | ORAL_CAPSULE | Freq: Two times a day (BID) | ORAL | Status: DC
  Administered 2016-06-08 – 2016-06-09 (×3): 100 mg via ORAL
  Filled 2016-06-08 (×3): qty 1

## 2016-06-08 MED ORDER — ROCURONIUM BROMIDE 50 MG/5ML IV SOLN
INTRAVENOUS | Status: AC
  Filled 2016-06-08: qty 1

## 2016-06-08 MED ORDER — DEXAMETHASONE SODIUM PHOSPHATE 10 MG/ML IJ SOLN
INTRAMUSCULAR | Status: DC | PRN
  Administered 2016-06-08: 8 mg via INTRAVENOUS

## 2016-06-08 MED ORDER — SIMETHICONE 80 MG PO CHEW
80.0000 mg | CHEWABLE_TABLET | Freq: Four times a day (QID) | ORAL | Status: DC | PRN
  Administered 2016-06-08: 80 mg via ORAL
  Filled 2016-06-08: qty 1

## 2016-06-08 MED ORDER — LACTATED RINGERS IV SOLN
INTRAVENOUS | Status: DC
  Administered 2016-06-08 (×2): via INTRAVENOUS

## 2016-06-08 MED ORDER — HYDROCHLOROTHIAZIDE 12.5 MG PO CAPS
12.5000 mg | ORAL_CAPSULE | Freq: Every day | ORAL | Status: DC
  Administered 2016-06-08 – 2016-06-09 (×2): 12.5 mg via ORAL
  Filled 2016-06-08 (×3): qty 1

## 2016-06-08 MED ORDER — OXYCODONE-ACETAMINOPHEN 5-325 MG PO TABS: 1.0000 | ORAL_TABLET | ORAL | Status: DC | PRN

## 2016-06-08 MED ORDER — LEVOTHYROXINE SODIUM 88 MCG PO TABS
88.0000 ug | ORAL_TABLET | Freq: Every day | ORAL | Status: DC
  Administered 2016-06-09: 88 ug via ORAL
  Filled 2016-06-08: qty 1

## 2016-06-08 MED ORDER — LOSARTAN POTASSIUM 50 MG PO TABS
50.0000 mg | ORAL_TABLET | Freq: Every day | ORAL | Status: DC
  Administered 2016-06-08 – 2016-06-09 (×2): 50 mg via ORAL
  Filled 2016-06-08 (×4): qty 1

## 2016-06-08 MED ORDER — MIDAZOLAM HCL 2 MG/2ML IJ SOLN
INTRAMUSCULAR | Status: AC
  Filled 2016-06-08: qty 2

## 2016-06-08 MED ORDER — ONDANSETRON HCL 4 MG/2ML IJ SOLN: 4.0000 mg | Freq: Once | INTRAMUSCULAR | Status: DC | PRN

## 2016-06-08 MED ORDER — FENTANYL CITRATE (PF) 100 MCG/2ML IJ SOLN
25.0000 ug | INTRAMUSCULAR | Status: DC | PRN
  Administered 2016-06-08 (×4): 25 ug via INTRAVENOUS

## 2016-06-08 MED ORDER — ONDANSETRON HCL 4 MG/2ML IJ SOLN
INTRAMUSCULAR | Status: DC | PRN
  Administered 2016-06-08: 4 mg via INTRAVENOUS

## 2016-06-08 MED ORDER — FENTANYL CITRATE (PF) 100 MCG/2ML IJ SOLN
INTRAMUSCULAR | Status: AC
  Filled 2016-06-08: qty 2

## 2016-06-08 MED ORDER — KETOROLAC TROMETHAMINE 30 MG/ML IJ SOLN
30.0000 mg | Freq: Four times a day (QID) | INTRAMUSCULAR | Status: AC
  Administered 2016-06-08 – 2016-06-09 (×4): 30 mg via INTRAVENOUS
  Filled 2016-06-08 (×4): qty 1

## 2016-06-08 MED ORDER — FENTANYL CITRATE (PF) 100 MCG/2ML IJ SOLN
INTRAMUSCULAR | Status: AC
  Administered 2016-06-08: 25 ug via INTRAVENOUS
  Filled 2016-06-08: qty 2

## 2016-06-08 MED ORDER — ONDANSETRON HCL 4 MG PO TABS: 4.0000 mg | ORAL_TABLET | Freq: Four times a day (QID) | ORAL | Status: DC | PRN

## 2016-06-08 MED ORDER — EPHEDRINE SULFATE 50 MG/ML IJ SOLN
INTRAMUSCULAR | Status: DC | PRN
  Administered 2016-06-08: 50 mg via INTRAVENOUS
  Administered 2016-06-08 (×2): 10 mg via INTRAVENOUS

## 2016-06-08 MED ORDER — FENTANYL CITRATE (PF) 100 MCG/2ML IJ SOLN
INTRAMUSCULAR | Status: DC | PRN
  Administered 2016-06-08 (×4): 50 ug via INTRAVENOUS

## 2016-06-08 MED ORDER — PROPOFOL 10 MG/ML IV BOLUS
INTRAVENOUS | Status: DC | PRN
  Administered 2016-06-08: 170 mg via INTRAVENOUS

## 2016-06-08 MED ORDER — BISACODYL 10 MG RE SUPP: 10.0000 mg | Freq: Every day | RECTAL | Status: DC | PRN

## 2016-06-08 MED ORDER — FLUTICASONE FUROATE-VILANTEROL 200-25 MCG/INH IN AEPB
1.0000 | INHALATION_SPRAY | Freq: Every day | RESPIRATORY_TRACT | Status: DC
  Administered 2016-06-08 – 2016-06-09 (×2): 1 via RESPIRATORY_TRACT
  Filled 2016-06-08: qty 28

## 2016-06-08 MED ORDER — PROPOFOL 10 MG/ML IV BOLUS
INTRAVENOUS | Status: AC
Start: 2016-06-08 — End: 2016-06-08
  Filled 2016-06-08: qty 20

## 2016-06-08 MED ORDER — SUCCINYLCHOLINE CHLORIDE 20 MG/ML IJ SOLN
INTRAMUSCULAR | Status: AC
  Filled 2016-06-08: qty 1

## 2016-06-08 MED ORDER — ONDANSETRON HCL 4 MG/2ML IJ SOLN: 4.0000 mg | Freq: Four times a day (QID) | INTRAMUSCULAR | Status: DC | PRN

## 2016-06-08 MED ORDER — BUPIVACAINE HCL (PF) 0.5 % IJ SOLN
INTRAMUSCULAR | Status: AC
  Filled 2016-06-08: qty 30

## 2016-06-08 MED ORDER — ROCURONIUM BROMIDE 100 MG/10ML IV SOLN
INTRAVENOUS | Status: DC | PRN
  Administered 2016-06-08: 30 mg via INTRAVENOUS
  Administered 2016-06-08 (×2): 10 mg via INTRAVENOUS

## 2016-06-08 MED ORDER — LIDOCAINE HCL (CARDIAC) 20 MG/ML IV SOLN
INTRAVENOUS | Status: DC | PRN
  Administered 2016-06-08: 100 mg via INTRAVENOUS

## 2016-06-08 MED ORDER — FLUORESCEIN SODIUM 10 % IV SOLN
INTRAVENOUS | Status: AC
  Filled 2016-06-08: qty 5

## 2016-06-08 MED ORDER — MORPHINE SULFATE (PF) 2 MG/ML IV SOLN
1.0000 mg | INTRAVENOUS | Status: DC | PRN
Start: 2016-06-08 — End: 2016-06-09

## 2016-06-08 MED ORDER — PROPOFOL 10 MG/ML IV BOLUS
INTRAVENOUS | Status: AC
  Filled 2016-06-08: qty 20

## 2016-06-08 MED ORDER — LOSARTAN POTASSIUM-HCTZ 100-25 MG PO TABS: 0.5000 | ORAL_TABLET | Freq: Every day | ORAL | Status: DC

## 2016-06-08 MED ORDER — MIDAZOLAM HCL 2 MG/2ML IJ SOLN
INTRAMUSCULAR | Status: DC | PRN
  Administered 2016-06-08: 2 mg via INTRAVENOUS

## 2016-06-08 MED ORDER — SUCCINYLCHOLINE CHLORIDE 20 MG/ML IJ SOLN
INTRAMUSCULAR | Status: DC | PRN
  Administered 2016-06-08: 120 mg via INTRAVENOUS

## 2016-06-08 MED ORDER — LACTATED RINGERS IV SOLN
INTRAVENOUS | Status: DC
  Administered 2016-06-08 (×2): via INTRAVENOUS

## 2016-06-08 MED ORDER — BUPIVACAINE HCL (PF) 0.5 % IJ SOLN
INTRAMUSCULAR | Status: DC | PRN
  Administered 2016-06-08: 17 mL

## 2016-06-08 MED ORDER — SEVOFLURANE IN SOLN
RESPIRATORY_TRACT | Status: AC
  Filled 2016-06-08: qty 250

## 2016-06-08 MED ORDER — SUGAMMADEX SODIUM 200 MG/2ML IV SOLN
INTRAVENOUS | Status: DC | PRN
  Administered 2016-06-08: 250 mg via INTRAVENOUS

## 2016-06-08 SURGICAL SUPPLY — 73 items
BAG COUNTER SPONGE EZ (MISCELLANEOUS) ×3 IMPLANT
BAG URO DRAIN 2000ML W/SPOUT (MISCELLANEOUS) ×4 IMPLANT
BLADE SURG SZ11 CARB STEEL (BLADE) ×4 IMPLANT
CANISTER SUCT 1200ML W/VALVE (MISCELLANEOUS) ×4 IMPLANT
CATH FOL LEG HOLDER (MISCELLANEOUS) ×4 IMPLANT
CATH FOLEY 2WAY  5CC 16FR (CATHETERS) ×2
CATH ROBINSON RED A/P 16FR (CATHETERS) ×4 IMPLANT
CATH TRAY 16F METER LATEX (MISCELLANEOUS) ×4 IMPLANT
CATH URTH 16FR FL 2W BLN LF (CATHETERS) ×2 IMPLANT
CHLORAPREP W/TINT 26ML (MISCELLANEOUS) ×4 IMPLANT
CLOSURE WOUND 1/2 X4 (GAUZE/BANDAGES/DRESSINGS) ×1
COUNTER SPONGE BAG EZ (MISCELLANEOUS) ×1
DEFOGGER SCOPE WARMER CLEARIFY (MISCELLANEOUS) ×4 IMPLANT
DERMABOND ADVANCED (GAUZE/BANDAGES/DRESSINGS) ×2
DERMABOND ADVANCED .7 DNX12 (GAUZE/BANDAGES/DRESSINGS) ×2 IMPLANT
DEVICE SUTURE ENDOST 10MM (ENDOMECHANICALS) ×4 IMPLANT
DRAPE CAMERA CLOSED 9X96 (DRAPES) ×4 IMPLANT
DRAPE LAPAROTOMY 100X77 ABD (DRAPES) ×4 IMPLANT
DRAPE LAPAROTOMY TRNSV 106X77 (MISCELLANEOUS) ×4 IMPLANT
DRSG TELFA 3X8 NADH (GAUZE/BANDAGES/DRESSINGS) ×4 IMPLANT
ELECT BLADE 6 FLAT ULTRCLN (ELECTRODE) ×4 IMPLANT
ELECT CAUTERY BLADE 6.4 (BLADE) ×4 IMPLANT
ELECT REM PT RETURN 9FT ADLT (ELECTROSURGICAL) ×4
ELECTRODE REM PT RTRN 9FT ADLT (ELECTROSURGICAL) ×2 IMPLANT
ENDOPOUCH RETRIEVER 10 (MISCELLANEOUS) IMPLANT
GAUZE SPONGE 4X4 12PLY STRL (GAUZE/BANDAGES/DRESSINGS) ×4 IMPLANT
GLOVE BIO SURGEON STRL SZ8 (GLOVE) ×20 IMPLANT
GLOVE INDICATOR 8.0 STRL GRN (GLOVE) ×4 IMPLANT
GOWN STRL REUS W/ TWL LRG LVL3 (GOWN DISPOSABLE) ×2 IMPLANT
GOWN STRL REUS W/ TWL XL LVL3 (GOWN DISPOSABLE) ×4 IMPLANT
GOWN STRL REUS W/TWL LRG LVL3 (GOWN DISPOSABLE) ×2
GOWN STRL REUS W/TWL XL LVL3 (GOWN DISPOSABLE) ×4
GRASPER SUT TROCAR 14GX15 (MISCELLANEOUS) ×4 IMPLANT
IRRIGATION STRYKERFLOW (MISCELLANEOUS) ×2 IMPLANT
IRRIGATOR STRYKERFLOW (MISCELLANEOUS) ×4
IV LACTATED RINGERS 1000ML (IV SOLUTION) ×4 IMPLANT
KIT PINK PAD W/HEAD ARE REST (MISCELLANEOUS) ×4
KIT PINK PAD W/HEAD ARM REST (MISCELLANEOUS) ×2 IMPLANT
KIT RM TURNOVER CYSTO AR (KITS) ×4 IMPLANT
KIT RM TURNOVER STRD PROC AR (KITS) ×4 IMPLANT
LABEL OR SOLS (LABEL) ×4 IMPLANT
MANIPULATOR VCARE MED CRV RETR (MISCELLANEOUS) ×4 IMPLANT
NEEDLE VERESS 14GA 120MM (NEEDLE) ×4 IMPLANT
NS IRRIG 1000ML POUR BTL (IV SOLUTION) ×4 IMPLANT
NS IRRIG 500ML POUR BTL (IV SOLUTION) ×4 IMPLANT
OCCLUDER COLPOPNEUMO (BALLOONS) ×4 IMPLANT
PACK BASIN MAJOR ARMC (MISCELLANEOUS) ×4 IMPLANT
PACK GYN LAPAROSCOPIC (MISCELLANEOUS) ×4 IMPLANT
PAD OB MATERNITY 4.3X12.25 (PERSONAL CARE ITEMS) ×4 IMPLANT
PAD PREP 24X41 OB/GYN DISP (PERSONAL CARE ITEMS) ×4 IMPLANT
SCISSORS METZENBAUM CVD 33 (INSTRUMENTS) ×4 IMPLANT
SET CYSTO W/LG BORE CLAMP LF (SET/KITS/TRAYS/PACK) ×4 IMPLANT
SHEARS HARMONIC ACE PLUS 36CM (ENDOMECHANICALS) ×4 IMPLANT
SLEEVE ENDOPATH XCEL 5M (ENDOMECHANICALS) ×8 IMPLANT
SPONGE LAP 18X18 5 PK (GAUZE/BANDAGES/DRESSINGS) ×4 IMPLANT
STAPLER SKIN PROX 35W (STAPLE) ×4 IMPLANT
STRAP SAFETY BODY (MISCELLANEOUS) ×4 IMPLANT
STRIP CLOSURE SKIN 1/2X4 (GAUZE/BANDAGES/DRESSINGS) ×3 IMPLANT
SUT ETHIBOND CT1 BRD #0 30IN (SUTURE) ×4 IMPLANT
SUT VIC AB 0 CT1 27 (SUTURE) ×2
SUT VIC AB 0 CT1 27XCR 8 STRN (SUTURE) ×2 IMPLANT
SUT VIC AB 0 CT2 27 (SUTURE) ×4 IMPLANT
SUT VIC AB 1 CT1 36 (SUTURE) ×4 IMPLANT
SUT VIC AB 2-0 UR6 27 (SUTURE) IMPLANT
SUT VIC AB 4-0 PS2 18 (SUTURE) ×4 IMPLANT
SYR 30ML LL (SYRINGE) ×4 IMPLANT
SYR 50ML LL SCALE MARK (SYRINGE) ×4 IMPLANT
SYRINGE 10CC LL (SYRINGE) ×4 IMPLANT
TRAY PREP VAG/GEN (MISCELLANEOUS) ×4 IMPLANT
TROCAR ENDO BLADELESS 11MM (ENDOMECHANICALS) ×4 IMPLANT
TROCAR XCEL NON-BLD 5MMX100MML (ENDOMECHANICALS) ×4 IMPLANT
TUBING INSUFFLATOR HEATED (MISCELLANEOUS) ×4 IMPLANT
TUBING INSUFFLATOR HI FLOW (MISCELLANEOUS) ×4 IMPLANT

## 2016-06-08 NOTE — Discharge Instructions (Signed)

## 2016-06-08 NOTE — Transfer of Care (Signed)
Immediate Anesthesia Transfer of Care Note  Patient: Yee Gangi  Procedure(s) Performed: Procedure(s): HYSTERECTOMY TOTAL LAPAROSCOPIC (N/A) LAPAROSCOPIC SALPINGO OOPHORECTOMY (Bilateral) LAPAROTOMY (N/A)  Patient Location: PACU  Anesthesia Type:General  Level of Consciousness: awake  Airway & Oxygen Therapy: Patient Spontanous Breathing and Patient connected to face mask oxygen  Post-op Assessment: Report given to RN and Post -op Vital signs reviewed and stable  Post vital signs: Reviewed and stable  Last Vitals:  Vitals:   06/08/16 0742  BP: (!) 164/88  Pulse: 70  Resp: 20  Temp: 36.4 C    Last Pain:  Vitals:   06/08/16 0742  TempSrc: Oral         Complications: No apparent anesthesia complications

## 2016-06-08 NOTE — Op Note (Signed)
Operative Report:  PRE-OP DIAGNOSIS: endometrial hyperplasia,postmenopausal bleeding   POST-OP DIAGNOSIS: endometrial hyperplasia,postmenopausal bleeding   PROCEDURE: Procedure(s): HYSTERECTOMY TOTAL LAPAROSCOPIC LAPAROSCOPIC SALPINGO OOPHORECTOMY CYSTOSCOPY  SURGEON: Barnett Applebaum, MD, FACOG  ASSISTANT: Dr Glennon Mac   ANESTHESIA: General endotracheal anesthesia  ESTIMATED BLOOD LOSS: less than 50   SPECIMENS: Uterus, Tubes, Ovaries.  COMPLICATIONS: None  DISPOSITION: stable to PACU  FINDINGS: Intraabdominal adhesions were noted. Adhesions in upper abdomen especially with omentum to anterior abdominal wall.  No significant adhesions along uterus or adnexa.  PROCEDURE:  The patient was taken to the OR where anesthesia was administed. She was prepped and draped in the normal sterile fashion in the dorsal lithotomy position in the Bethany stirrups. A time out was performed. A Graves speculum was inserted, the cervix was grasped with a single tooth tenaculum and the endometrial cavity was sounded. The cervix was progressively dilated to a size 18 [place] with [employer] dilators. A V-Care uterine manipulator was inserted in the usual fashion without incident. Gloves were changed and attention was turned to the abdomen.   An infraumbilical transverse 26mm skin incision was made with the scalpel after local anesthesia applied to the skin. A Veress-step needle was inserted in the usual fashion and confirmed using the hanging drop technique. A pneumoperitoneum was obtained by insufflation of CO2 (opening pressure of 77mmHg) to 60mmHg. A diagnostic laparoscopy was performed yielding the previously described findings. Attention was turned to the left lower quadrant where after visualization of the inferior epigastric vessels a 51mm skin incision was made with the scalpel. A 5 mm laparoscopic port was inserted. The same procedure was repeated in the right lower quadrant with a 9mm trocar.  An additional 5 mm  trocar was placed to help with bowel retraction.   Attention was turned to the left aspect of the uterus, where after visualization of the ureter, the round ligament was coagulated and transected using the 69mm Harmonic Scapel. The anterior and posterior leafs of the broad ligament were dissected off as the anterior one was coagulated and transected in a caudal direction towards the cuff of the uterine manipulator.  Attention was then turned to the left fallopian tube and ovary which was recognized by visualization of the fimbria. The infundibulopelvic ligament and its blood vessels were carefully coagulated and transected using the Harmonic scapel.  Attention was turned to the right aspect of the uterus where the same procedure was performed.  The vesicouterine reflection of the peritoneum was dissected with the harmonic scapel and the bladder flap was created bluntly.  The uterine vessels were coagulated and transected bilaterally using first bipolar cautery and then the harmonic scapel. A 360 degree, circumferential colpotomy was done to completely amputate the uterus with cervix and tubes. Once the specimen was amputated it was delivered through the vagina.   The colpotomy was repaired in a simple interrupted ashion using a 0-Polysorb suture with an endo-stitch device.  Vaginal exam confirms complete closure.  The cavity was copiously irrigated. A survey of the pelvic cavity revealed adequate hemostasis and no injury to bowel, bladder, or ureter.   A diagnostic cystoscopy was performed using saline distension of bladder with no lesions or injuries noted.  Bilateral urine flow from each ureteral orifice is visualized.  At this point the procedure was finalized. The RLQ incision and fascia is closed with a ) Vicryl suture using a fascia closure device.  All the instruments were removed from the patient's body. Gas was expelled and patient is leveled.  Incisions  are closed with skin adhesive.    Patient goes  to recovery room in stable condition.  All sponge, instrument, and needle counts are correct x2.

## 2016-06-08 NOTE — Progress Notes (Signed)
Day of Surgery Procedure(s) (LRB): HYSTERECTOMY TOTAL LAPAROSCOPIC (N/A) LAPAROSCOPIC SALPINGO OOPHORECTOMY (Bilateral) LAPAROTOMY (N/A)  Subjective: Patient reports incisional pain and and feels gas pain in abdomen; pain is mild; no nausea.    Objective: I have reviewed patient's vital signs, intake and output and medications.  Abd: Min T, ND Incision: clean, dry and intact Extr: no calf T, no edema  Assessment: s/p Procedure(s): HYSTERECTOMY TOTAL LAPAROSCOPIC (N/A) LAPAROSCOPIC SALPINGO OOPHORECTOMY (Bilateral) LAPAROTOMY (N/A): stable  Plan: Advance diet Advance to PO medication Foley out in am  LOS: 0 days    Hoyt Koch 06/08/2016, 4:25 PM

## 2016-06-08 NOTE — H&P (Signed)
History and Physical Interval Note:  06/08/2016 7:49 AM  Brianna Gray  has presented today for surgery, with the diagnosis of endometrial hyperplasia,postmenopausal bleeding  The various methods of treatment have been discussed with the patient and family. After consideration of risks, benefits and other options for treatment, the patient has consented to  Procedure(s): HYSTERECTOMY TOTAL LAPAROSCOPIC (N/A) LAPAROSCOPIC SALPINGO OOPHORECTOMY (Bilateral) LAPAROTOMY (N/A) (possible) as a surgical intervention .  The patient's history has been reviewed, patient examined, no change in status, stable for surgery.  Pt has the following beta blocker history-  Not taking Beta Blocker.  I have reviewed the patient's chart and labs.  Questions were answered to the patient's satisfaction.    Hoyt Koch

## 2016-06-08 NOTE — Anesthesia Postprocedure Evaluation (Signed)
Anesthesia Post Note  Patient: Brianna Gray  Procedure(s) Performed: Procedure(s) (LRB): HYSTERECTOMY TOTAL LAPAROSCOPIC (N/A) LAPAROSCOPIC SALPINGO OOPHORECTOMY (Bilateral) LAPAROTOMY (N/A)  Patient location during evaluation: PACU Anesthesia Type: General Level of consciousness: awake and alert Pain management: pain level controlled Vital Signs Assessment: post-procedure vital signs reviewed and stable Respiratory status: spontaneous breathing, nonlabored ventilation, respiratory function stable and patient connected to nasal cannula oxygen Cardiovascular status: blood pressure returned to baseline and stable Postop Assessment: no signs of nausea or vomiting Anesthetic complications: no     Last Vitals:  Vitals:   06/08/16 1228 06/08/16 1300  BP: (!) 170/87 117/90  Pulse: 64 64  Resp: 14 16  Temp:  36.4 C    Last Pain:  Vitals:   06/08/16 1300  TempSrc: Oral  PainSc:                  Martha Clan

## 2016-06-08 NOTE — Anesthesia Procedure Notes (Signed)
Procedure Name: Intubation Date/Time: 06/08/2016 9:00 AM Performed by: Allean Found Pre-anesthesia Checklist: Patient identified, Emergency Drugs available, Suction available, Patient being monitored and Timeout performed Patient Re-evaluated:Patient Re-evaluated prior to inductionOxygen Delivery Method: Circle system utilized Preoxygenation: Pre-oxygenation with 100% oxygen Intubation Type: IV induction Ventilation: Mask ventilation without difficulty Laryngoscope Size: McGraph and 3 Grade View: Grade II Tube type: Oral Tube size: 7.0 mm Number of attempts: 1 Airway Equipment and Method: Stylet Placement Confirmation: ETT inserted through vocal cords under direct vision,  positive ETCO2 and breath sounds checked- equal and bilateral Secured at: 21 cm Dental Injury: Teeth and Oropharynx as per pre-operative assessment

## 2016-06-08 NOTE — Anesthesia Post-op Follow-up Note (Cosign Needed)
Anesthesia QCDR form completed.        

## 2016-06-08 NOTE — Anesthesia Preprocedure Evaluation (Signed)
Anesthesia Evaluation  Patient identified by MRN, date of birth, ID band Patient awake    Reviewed: Allergy & Precautions, H&P , NPO status , Patient's Chart, lab work & pertinent test results, reviewed documented beta blocker date and time   History of Anesthesia Complications Negative for: history of anesthetic complications  Airway Mallampati: IV  TM Distance: >3 FB Neck ROM: full    Dental  (+) Missing, Poor Dentition   Pulmonary shortness of breath and with exertion, asthma , sleep apnea , neg COPD, neg recent URI,           Cardiovascular Exercise Tolerance: Good hypertension, (-) angina(-) CAD, (-) Past MI, (-) Cardiac Stents and (-) CABG (-) dysrhythmias (-) Valvular Problems/Murmurs     Neuro/Psych negative neurological ROS  negative psych ROS   GI/Hepatic Neg liver ROS, GERD  ,  Endo/Other  diabetes (borderline)Hypothyroidism Morbid obesity  Renal/GU negative Renal ROS  negative genitourinary   Musculoskeletal   Abdominal   Peds  Hematology negative hematology ROS (+)   Anesthesia Other Findings Past Medical History: No date: Asthma No date: Cardiomyopathy ([place]) No date: Carpal tunnel syndrome No date: Carpal tunnel syndrome No date: Dizziness No date: Edema No date: Elevated lipids No date: GERD (gastroesophageal reflux disease) No date: Hypertension No date: Lower extremity edema No date: Seasonal allergies No date: Sleep apnea No date: Wheezing   Reproductive/Obstetrics negative OB ROS                             Anesthesia Physical Anesthesia Plan  ASA: III  Anesthesia Plan: General ETT   Post-op Pain Management:    Induction:   Airway Management Planned:   Additional Equipment:   Intra-op Plan:   Post-operative Plan:   Informed Consent: I have reviewed the patients History and Physical, chart, labs and discussed the procedure including the risks,  benefits and alternatives for the proposed anesthesia with the patient or authorized representative who has indicated his/her understanding and acceptance.   Dental Advisory Given  Plan Discussed with: Anesthesiologist, CRNA and Surgeon  Anesthesia Plan Comments:         Anesthesia Quick Evaluation

## 2016-06-09 ENCOUNTER — Encounter: Payer: Self-pay | Admitting: Obstetrics & Gynecology

## 2016-06-09 DIAGNOSIS — N800 Endometriosis of uterus: Secondary | ICD-10-CM | POA: Diagnosis not present

## 2016-06-09 LAB — HEMOGLOBIN: HEMOGLOBIN: 10.7 g/dL — AB (ref 12.0–16.0)

## 2016-06-09 LAB — SURGICAL PATHOLOGY

## 2016-06-09 MED ORDER — OXYCODONE-ACETAMINOPHEN 5-325 MG PO TABS: 1.0000 | ORAL_TABLET | ORAL | 0 refills | Status: DC | PRN

## 2016-06-09 NOTE — Discharge Summary (Signed)
Gynecology Physician Postoperative Discharge Summary  Patient ID: Brianna Gray MRN: [id number] DOB/AGE: [birth year] 66 y.o.  Admit Date: 06/08/2016 Discharge Date: 06/09/2016  Preoperative Diagnoses: Postmenopausal Bleeding  Procedures: Procedure(s) (LRB): HYSTERECTOMY TOTAL LAPAROSCOPIC (N/A) LAPAROSCOPIC SALPINGO OOPHORECTOMY (Bilateral) LAPAROTOMY (N/A)  Significant Labs: CBC Latest Ref Rng & Units 06/09/2016 05/24/2016 03/09/2015  WBC 3.6 - 11.0 K/uL - 9.1 8.9  Hemoglobin 12.0 - 16.0 g/dL 10.7(L) 11.6(L) 12.1  Hematocrit 35.0 - 47.0 % - 36.5 38.1  Platelets 150 - 440 K/uL - 267 258    Hospital Course:  Brianna Gray is a 66 y.o. [id number]  admitted for scheduled surgery.  She underwent the procedures as mentioned above, her operation was uncomplicated. For further details about surgery, please refer to the operative report. Patient had an uncomplicated postoperative course. By time of discharge on POD#1, her pain was controlled on oral pain medications; she was ambulating, voiding without difficulty, tolerating regular diet and passing flatus. She was deemed stable for discharge to home.   Discharge Exam: Blood pressure (!) 161/75, pulse 69, temperature 98.4 F (36.9 C), temperature source Oral, resp. rate 20, SpO2 97 %. General appearance: alert and no distress  Resp: clear to auscultation bilaterally  Cardio: regular rate and rhythm  GI: soft, non-tender; bowel sounds normal; no masses, no organomegaly.  Incision: C/D/I, no erythema, no drainage noted Pelvic: scant blood on pad  Extremities: extremities normal, atraumatic, no cyanosis or edema and Homans sign is negative, no sign of DVT  Discharged Condition: Stable  Disposition: 01-Home or Self Care  Discharge Instructions    Call MD for:  redness, tenderness, or signs of infection (pain, swelling, redness, odor or green/yellow discharge around incision site)    Complete by:  As directed    Call MD for:  severe  uncontrolled pain    Complete by:  As directed    Call MD for:  temperature >100.4    Complete by:  As directed    Diet general    Complete by:  As directed    Driving Restrictions    Complete by:  As directed    No driving for 2 weeks   Increase activity slowly    Complete by:  As directed    Lifting restrictions    Complete by:  As directed    No heavy lifting (>30lbs) for 6 weeks   No dressing needed    Complete by:  As directed    Have dressing over incision removed tomorrow   Sexual Activity Restrictions    Complete by:  As directed    No sexual activity for 6 weeks     Allergies as of 06/09/2016   No Known Allergies     Medication List    TAKE these medications   ADVAIR DISKUS 250-50 MCG/DOSE Aepb Generic drug:  Fluticasone-Salmeterol Take 1 puff by mouth 2 (two) times daily as needed. For respiratory issues.   atorvastatin 10 MG tablet Commonly known as:  LIPITOR Take 10 mg by mouth every morning.   fluticasone 50 MCG/ACT nasal spray Commonly known as:  FLONASE Place 1-2 sprays into both nostrils daily as needed. For nasal congestion.   furosemide 20 MG tablet Commonly known as:  LASIX Take 20 mg by mouth daily at 12 noon.   levothyroxine 88 MCG tablet Commonly known as:  SYNTHROID, LEVOTHROID Take 88 mcg by mouth daily before breakfast. Take on an empty stomach.   loratadine 10 MG tablet Commonly known as:  CLARITIN Take 10 mg  by mouth daily at 12 noon.   losartan-hydrochlorothiazide 100-25 MG tablet Commonly known as:  HYZAAR Take 0.5 tablets by mouth daily at 12 noon.   meclizine 25 MG tablet Commonly known as:  ANTIVERT Take 25 mg by mouth 3 (three) times daily as needed for dizziness.   medroxyPROGESTERone 10 MG tablet Commonly known as:  PROVERA Take 20 mg by mouth daily at 12 noon.   meloxicam 7.5 MG tablet Commonly known as:  MOBIC Take 7.5 mg by mouth daily at 12 noon.   NEXIUM 40 MG capsule Generic drug:  esomeprazole Take 40 mg  by mouth daily at 12 noon.   oxyCODONE-acetaminophen 5-325 MG tablet Commonly known as:  PERCOCET/ROXICET Take 1-2 tablets by mouth every 4 (four) hours as needed (moderate to severe pain (when tolerating fluids)).   Potassium 99 MG Tabs Take 99 mg by mouth daily at 12 noon.   REDNESS RELIEF OP Place 1-2 Bars into both eyes 2 (two) times daily as needed (for eye irritation).   vitamin B-12 1000 MCG tablet Commonly known as:  CYANOCOBALAMIN Take 1,000 mcg by mouth daily at 12 noon.      Follow-up Information    Hoyt Koch, MD. Go in 2 week(s).   Specialty:  Obstetrics and Gynecology Why:  on May 4 as scheduled Contact information: [address] [place] [address] [phone]           Barnett Applebaum, MD

## 2016-06-09 NOTE — Progress Notes (Signed)
1 Day Post-Op Procedure(s) (LRB): HYSTERECTOMY TOTAL LAPAROSCOPIC (N/A) LAPAROSCOPIC SALPINGO OOPHORECTOMY (Bilateral) LAPAROTOMY (N/A)  Subjective: Patient reports incisional pain and and feels gas pain in abdomen; pain is mild; no nausea.   Foley out this am, awaiting void. +appetite  Objective: I have reviewed patient's vital signs, intake and output and medications.  Abd: Min T, ND Incision: clean, dry and intact Extr: no calf T, no edema  Assessment: s/p Procedure(s): HYSTERECTOMY TOTAL LAPAROSCOPIC (N/A) LAPAROSCOPIC SALPINGO OOPHORECTOMY (Bilateral) LAPAROTOMY (N/A): stable  Plan: Advance diet Advance to PO medication Foley out  Ambulate   LOS: 1 days    Hoyt Koch 06/09/2016, 7:41 AM

## 2016-06-09 NOTE — Progress Notes (Signed)
Patient understands all discharge instructions and the need to make follow up appointments. Patient discharge via wheelchair with auxillary. 

## 2016-06-30 ENCOUNTER — Ambulatory Visit (INDEPENDENT_AMBULATORY_CARE_PROVIDER_SITE_OTHER): Payer: Medicaid Other | Admitting: Obstetrics & Gynecology

## 2016-06-30 ENCOUNTER — Encounter: Payer: Self-pay | Admitting: Obstetrics & Gynecology

## 2016-06-30 VITALS — BP 142/84 | Ht 60.0 in | Wt 266.0 lb

## 2016-06-30 DIAGNOSIS — N8500 Endometrial hyperplasia, unspecified: Secondary | ICD-10-CM

## 2016-06-30 NOTE — Progress Notes (Signed)
  Postoperative Follow-up Patient presents post op from laparoscopic assisted vaginal hysterectomy and bilateral oophorectomy for endometrial hyperplasia, 2 weeks ago. PATH: DIAGNOSIS:  A. UTERUS WITH CERVIX AND BILATERAL FALLOPIAN TUBES AND OVARIES; TOTAL  HYSTERECTOMY WITH BILATERAL SALPINGECTOMY:  - CHRONIC CERVICITIS SQUAMOUS METAPLASIA.  - BENIGN ENDOMETRIAL POLYP, 0.5 CM.  - INACTIVE ENDOMETRIUM WITH FOCAL EOSINOPHILIC CHANGE AND ADMIXED  MUCINOUS METAPLASIA.  - MYOMETRIUM WITH EXTENSIVE ADENOMYOSIS, SMALL LEIOMYOMA, AND VASCULAR  CALCIFICATION.  - BILATERAL OVARIES WITH STROMAL HYPERPLASIA.  - UNREMARKABLE BILATERAL FALLOPIAN TUBES.  - NEGATIVE FOR HYPERPLASIA, ATYPIA, AND MALIGNANCY  Subjective: Patient reports some improvement in her preop symptoms. Eating a regular diet without difficulty. The patient is not having any pain.  Activity: normal activities of daily living. Patient reports vaginal sx's of None  Objective: There were no vitals taken for this visit. Physical Exam  Constitutional: She is oriented to person, place, and time. She appears well-developed and well-nourished. No distress.  Cardiovascular: Normal rate.   Pulmonary/Chest: Effort normal.  Abdominal: Soft. She exhibits no distension. There is no tenderness.  Incision Healing Well   Musculoskeletal: Normal range of motion.  Neurological: She is alert and oriented to person, place, and time. No cranial nerve deficit.  Skin: Skin is warm and dry.  Psychiatric: She has a normal mood and affect.    Assessment: s/p :  bilateral oophorectomy and total laparoscopic hysterectomy with bilateral salpingectomy stable  Plan: Patient has done well after surgery with no apparent complications.  I have discussed the post-operative course to date, and the expected progress moving forward.  The patient understands what complications to be concerned about.  I will see the patient in routine follow up, or sooner if needed.     Activity plan: No heavy lifting.  Hoyt Koch 06/30/2016, 10:05 AM

## 2016-07-28 ENCOUNTER — Encounter: Payer: Self-pay | Admitting: Obstetrics & Gynecology

## 2016-07-28 ENCOUNTER — Ambulatory Visit (INDEPENDENT_AMBULATORY_CARE_PROVIDER_SITE_OTHER): Payer: Medicaid Other | Admitting: Obstetrics & Gynecology

## 2016-07-28 VITALS — BP 138/90 | HR 73 | Ht 63.0 in | Wt 267.0 lb

## 2016-07-28 DIAGNOSIS — N950 Postmenopausal bleeding: Secondary | ICD-10-CM

## 2016-07-28 NOTE — Progress Notes (Signed)
Postoperative Follow-up Patient presents post op from laparoscopic assisted vaginal hysterectomy and bilateral oophorectomy for endometrial hyperplasia, 6weeks ago. PATH: DIAGNOSIS:  A. UTERUS WITH CERVIX AND BILATERAL FALLOPIAN TUBES AND OVARIES; TOTAL  HYSTERECTOMY WITH BILATERAL SALPINGECTOMY:  - CHRONIC CERVICITIS SQUAMOUS METAPLASIA.  - BENIGN ENDOMETRIAL POLYP, 0.5 CM.  - INACTIVE ENDOMETRIUM WITH FOCAL EOSINOPHILIC CHANGE AND ADMIXED  MUCINOUS METAPLASIA.  - MYOMETRIUM WITH EXTENSIVE ADENOMYOSIS, SMALL LEIOMYOMA, AND VASCULAR  CALCIFICATION.  - BILATERAL OVARIES WITH STROMAL HYPERPLASIA.  - UNREMARKABLE BILATERAL FALLOPIAN TUBES.  - NEGATIVE FOR HYPERPLASIA, ATYPIA, AND MALIGNANCY  Subjective: Patient reports marked improvement in her preop symptoms. Eating a regular diet without difficulty. The patient is not having any pain.  Activity: normal activities of daily living. Patient reports vaginal sx's of None.  One episode of bleeding 2 weeks ago light.  Objective: There were no vitals taken for this visit. Physical Exam  Constitutional: She is oriented to person, place, and time. She appears well-developed and well-nourished. No distress.  Cardiovascular: Normal rate.   Pulmonary/Chest: Effort normal.  Abdominal: Soft. She exhibits no distension. There is no tenderness.  Incision Healing Well Musculoskeletal: Normal range of motion.  Neurological: She is alert and oriented to person, place, and time. No cranial nerve deficit.  Skin: Skin is warm and dry.  Psychiatric: She has a normal mood and affect.  Declines GYN exam due to Ramadan  Assessment: s/p :  bilateral oophorectomy and total laparoscopic hysterectomy with bilateral salpingectomy stable  Plan: Patient has done well after surgery with no apparent complications.  I have discussed the post-operative course to date, and the expected progress moving forward.  The patient understands what complications to be  concerned about.  I will see the patient in routine follow up, or sooner if needed.    Activity plan: No heavy lifting.  Barnett Applebaum, MD, [facility], [facility] 07/28/2016  10:12 AM

## 2016-08-21 ENCOUNTER — Ambulatory Visit (INDEPENDENT_AMBULATORY_CARE_PROVIDER_SITE_OTHER): Payer: Medicaid Other | Admitting: Obstetrics & Gynecology

## 2016-08-21 VITALS — BP 140/80 | Ht 63.0 in | Wt 266.0 lb

## 2016-08-21 DIAGNOSIS — N8501 Benign endometrial hyperplasia: Secondary | ICD-10-CM

## 2016-08-21 NOTE — Progress Notes (Signed)
  Postoperative Follow-up Patient presents post op from [place], BSO for endometrial hyperplasia, 2 months ago.  Subjective: Patient reports marked improvement in her preop symptoms. Eating a regular diet without difficulty. The patient is not having any pain.  Activity: normal activities of daily living. Patient reports vaginal sx's of None  Objective: BP 140/80   Ht 5\' 3"  (1.6 m)   Wt 266 lb (120.7 kg)   BMI 47.12 kg/m  Physical Exam  Constitutional: She is oriented to person, place, and time. She appears well-developed and well-nourished. No distress.  Genitourinary: Rectum normal and vagina normal. Pelvic exam was performed with patient supine. There is no rash, tenderness or lesion on the right labia. There is no rash, tenderness or lesion on the left labia. No erythema or bleeding in the vagina. Right adnexum does not display mass and does not display tenderness. Left adnexum does not display mass and does not display tenderness.  Genitourinary Comments: Cervix and uterus absent. Vaginal cuff healing well.  Cardiovascular: Normal rate.   Pulmonary/Chest: Effort normal.  Abdominal: Soft. She exhibits no distension. There is no tenderness.  Incision healing well.  Musculoskeletal: Normal range of motion.  Neurological: She is alert and oriented to person, place, and time. No cranial nerve deficit.  Skin: Skin is warm and dry.  Psychiatric: She has a normal mood and affect.    Assessment: s/p :  total laparoscopic hysterectomy with bilateral salpingectomy stable  Plan: Patient has done well after surgery with no apparent complications.  I have discussed the post-operative course to date, and the expected progress moving forward.  The patient understands what complications to be concerned about.  I will see the patient in routine follow up, or sooner if needed.    Activity plan: No restriction.  Hoyt Koch 08/21/2016, 2:08 PM

## 2017-02-13 ENCOUNTER — Ambulatory Visit: Payer: Self-pay | Admitting: Nurse Practitioner

## 2017-02-13 VITALS — BP 141/77 | HR 68 | Resp 16 | Ht 64.0 in | Wt 267.2 lb

## 2017-02-13 DIAGNOSIS — K219 Gastro-esophageal reflux disease without esophagitis: Secondary | ICD-10-CM | POA: Insufficient documentation

## 2017-02-13 DIAGNOSIS — E783 Hyperchylomicronemia: Secondary | ICD-10-CM | POA: Insufficient documentation

## 2017-02-13 DIAGNOSIS — K2100 Gastro-esophageal reflux disease with esophagitis, without bleeding: Secondary | ICD-10-CM

## 2017-02-13 DIAGNOSIS — J4521 Mild intermittent asthma with (acute) exacerbation: Secondary | ICD-10-CM

## 2017-02-13 DIAGNOSIS — R0602 Shortness of breath: Secondary | ICD-10-CM | POA: Insufficient documentation

## 2017-02-13 DIAGNOSIS — I10 Essential (primary) hypertension: Secondary | ICD-10-CM | POA: Insufficient documentation

## 2017-02-13 DIAGNOSIS — E039 Hypothyroidism, unspecified: Secondary | ICD-10-CM

## 2017-02-13 DIAGNOSIS — M25571 Pain in right ankle and joints of right foot: Secondary | ICD-10-CM

## 2017-02-13 MED ORDER — FLUTICASONE PROPIONATE 50 MCG/ACT NA SUSP: 1.0000 | Freq: Every day | NASAL | 4 refills | Status: DC

## 2017-02-13 MED ORDER — ESOMEPRAZOLE MAGNESIUM 40 MG PO CPDR: 40.0000 mg | DELAYED_RELEASE_CAPSULE | Freq: Every day | ORAL | 4 refills | Status: DC

## 2017-02-13 MED ORDER — FUROSEMIDE 20 MG PO TABS: 20.0000 mg | ORAL_TABLET | Freq: Every day | ORAL | 4 refills | Status: DC

## 2017-02-13 MED ORDER — MELOXICAM 7.5 MG PO TABS: 7.5000 mg | ORAL_TABLET | Freq: Two times a day (BID) | ORAL | 4 refills | Status: DC

## 2017-02-13 MED ORDER — FLUTICASONE-SALMETEROL 250-50 MCG/DOSE IN AEPB
1.0000 | INHALATION_SPRAY | Freq: Two times a day (BID) | RESPIRATORY_TRACT | 4 refills | Status: DC
Start: 2017-02-13 — End: 2017-03-20

## 2017-02-13 MED ORDER — LEVOTHYROXINE SODIUM 88 MCG PO TABS: 88.0000 ug | ORAL_TABLET | Freq: Every day | ORAL | 4 refills | Status: DC

## 2017-02-13 MED ORDER — LORATADINE 10 MG PO TABS: 10.0000 mg | ORAL_TABLET | Freq: Every day | ORAL | 4 refills | Status: DC

## 2017-02-13 MED ORDER — LOSARTAN POTASSIUM-HCTZ 100-12.5 MG PO TABS: 1.0000 | ORAL_TABLET | Freq: Every day | ORAL | 4 refills | Status: DC

## 2017-02-13 MED ORDER — ATORVASTATIN CALCIUM 10 MG PO TABS: 10.0000 mg | ORAL_TABLET | Freq: Every day | ORAL | 4 refills | Status: DC

## 2017-02-13 NOTE — Progress Notes (Signed)
[facility]-EMERGENCY DEPT Provider Note   CSN: [id number] Arrival date & time: 02/05/22  1044     History  Chief Complaint  Patient presents with   Shortness of Breath    David J Turner is a 66 y.o. female with a past medical history of seizure, throat cancer, skin cancer brought into the emergency department by [facility] for evaluation of shortness of breath.  Patient states that he has had worsening shortness of breath in the last 3 to 4 days.  Reports history of throat cancer that he uses suction at home to remove mucus in his throat.  States in the last few days he has not been able to suction the mucus which increases shortness of breath.  States he is undergoing immunotherapy for throat cancer once every 3 weeks, last treatment was on Wednesday.  Denies fever, chest pain, headache, dizziness, bowel changes, urinary symptoms.   Shortness of Breath     Past Medical History:  Diagnosis Date   Seizures (HCC)    Past Surgical History:  Procedure Laterality Date   APPENDECTOMY     RESECTION DISTAL CLAVICAL       Home Medications Prior to Admission medications   Medication Sig Start Date End Date Taking? Authorizing Provider  ACCU-CHEK AVIVA PLUS test strip USE TO CHECK BLOOD GLUCOSE BID 06/24/18   [provider]  Accu-Chek Softclix Lancets lancets USE TO CHECK BLOOD GLUCOSE BID AS DIRECTED 06/24/18   [provider]  Blood Glucose Monitoring Suppl (ACCU-CHEK AVIVA PLUS) w/Device KIT CHECK BLOOD GLUCOSE BID 06/24/18   [provider]  Cholecalciferol (D3-1000 PO) Take 2,000 mg by mouth daily.     [provider]  DEPAKOTE SPRINKLES 125 MG capsule Take 4 capsules in AM, 8 capsules at night 11/30/21   Aquino, Karen M, MD  levETIRAcetam (KEPPRA) 1000 MG tablet TAKE 1 TABLET BY MOUTH TWICE DAILY 03/30/21   Aquino, Karen M, MD  lovastatin (MEVACOR) 10 MG tablet TK 1 T PO QD 04/05/17   [provider]  metFORMIN (GLUCOPHAGE)  500 MG tablet Take 500 mg by mouth 2 (two) times daily. 01/04/21   [provider]      Allergies    Patient has no known allergies.    Review of Systems   Review of Systems  Respiratory:  Positive for shortness of breath.     Physical Exam Updated Vital Signs BP 133/68   Pulse 86   Temp 97.7 F (36.5 C) (Oral)   Resp 16   Ht 5' 9" (1.753 m)   Wt 56.7 kg   SpO2 98%   BMI 18.46 kg/m  Physical Exam  ED Results / Procedures / Treatments   Labs (all labs ordered are listed, but only abnormal results are displayed) Labs Reviewed  RESP PANEL BY RT-PCR (RSV, FLU A&B, COVID)  [id number]  URINALYSIS, ROUTINE W REFLEX MICROSCOPIC  BASIC METABOLIC PANEL  CBC WITH DIFFERENTIAL/PLATELET    EKG EKG Interpretation  Date/Time:  Sunday February 05 2022 10:55:19 EST Ventricular Rate:  87 PR Interval:  150 QRS Duration: 86 QT Interval:  387 QTC Calculation: 466 R Axis:   -75 Text Interpretation: Sinus rhythm Left anterior fascicular block Anteroseptal infarct, old Confirmed by Countryman, Chase ([id number]) on 02/05/2022 2:45:08 PM  Radiology No results found.  Procedures Procedures    Medications Ordered in ED Medications - No data to display  ED Course/ Medical Decision Making/ A&P Clinical Course as of 02/05/22 1755  Sun Feb 05, 2022  1138 Stable 49 YOM with throat cancer here with SOB. Feels oropharyngeal closure.   [CC]  1444 Patient requested DC [CC]    Clinical Course User Index [CC] Countryman, Chase, MD                           Medical Decision Making Amount and/or Complexity of Data Reviewed Labs: ordered. Radiology: ordered.   This patient presents to the ED for shortness of breath, this involves an extensive number of treatment options, and is a complaint that carries with a high risk of complications and morbidity.  The differential diagnosis includes metastasis, flu, COVID, bronchitis, pneumonia, infectious etiology.  This is not an exhaustive  list.  Comorbidities that complicate the patient evaluation See HPI  Social determinants of health NA  Additional history obtained: External records from outside source obtained and reviewed including: Chart review including previous notes, labs, imaging.  Cardiac monitoring/EKG: The patient was maintained on a cardiac monitor.  I personally reviewed and interpreted the cardiac monitor which showed an underlying rhythm of: Sinus rhythm.  Lab tests: I ordered CBC, BMP, urinalysis, respiratory panel.  However due to technical issue with the chart, labs cannot be drawn.  Imaging studies: I ordered x-ray of the chest.  However due to technical issues with the chart, imaging cannot be done.  Problem list/ ED course/ Critical interventions/ Medical management: HPI: See above Vital signs within normal range and stable throughout visit. Laboratory/imaging studies significant for: See above. On physical examination, patient is afebrile and appears in no acute distress.  Patient is here due to increased shortness of breath in the last 3-4 days when he failed to suction the mucus from his throat cancer that he is usually able to.  Due to technical issue with the chart, labs and imaging cannot be performed by nursing.  Patient was placed on 2 L nasal cannula with EMS, satting at 99%.  Patient is weaned off of the oxygen in the ER and he seemed to be doing well, satting at 98% on room air.  Patient and patient family requested to go home since his shortness of breath has improved significantly. They refused to wait for labs and imaging studies to be done when technical issues with charting are fixed. Advised patient to follow-up with primary care for further evaluation and management.  Return to ER if new or worsening symptoms. I have reviewed the patient home medicines and have made adjustments as needed.  Consultations obtained: I requested consultation with Dr. Countryman, and discussed lab and  imaging findings as well as pertinent plan.  He/she agrees with the plan.  Disposition Continued outpatient therapy. Follow-up with PCP  recommended for reevaluation of symptoms. Treatment plan discussed with patient.  Pt acknowledged understanding was agreeable to the plan. Worrisome signs and symptoms were discussed with patient, and patient acknowledged understanding to return to the ED if they noticed these signs and symptoms. Patient was stable upon discharge.   This chart was dictated using voice recognition software.  Despite best efforts to proofread,  errors can occur which can change the documentation meaning.          Final Clinical Impression(s) / ED Diagnoses Final diagnoses:  Shortness of breath    Rx / DC Orders ED Discharge Orders     None         Le, Ken, PA 02/05/22 1755    Le, Ken, PA 02/07/22 1619      Countryman, Chase, MD 02/08/22 1541  

## 2017-02-13 NOTE — Progress Notes (Incomplete Revision)
Subjective:     Patient ID: Brianna Gray, female   DOB: [birth year], 66 y.o.   MRN: [id number]  Patient c/o right heel and ankle pain and swelling. Hurts more when standing and putting weight on the right foot. Denies injury or trauma to the right foot/ankle.     Current Outpatient Medications:    atorvastatin (LIPITOR) 10 MG tablet, Take 1 tablet (10 mg total) by mouth at bedtime., Disp: 30 tablet, Rfl: 4   esomeprazole (NEXIUM) 40 MG capsule, Take 1 capsule (40 mg total) by mouth daily at 12 noon., Disp: 30 capsule, Rfl: 4   fluticasone (FLONASE) 50 MCG/ACT nasal spray, Place 1 spray into both nostrils daily., Disp: 16 g, Rfl: 4   Fluticasone-Salmeterol (ADVAIR) 250-50 MCG/DOSE AEPB, Inhale 1 puff into the lungs 2 (two) times daily., Disp: 60 each, Rfl: 4   furosemide (LASIX) 20 MG tablet, Take 1 tablet (20 mg total) by mouth daily., Disp: 30 tablet, Rfl: 4   levothyroxine (SYNTHROID, LEVOTHROID) 88 MCG tablet, Take 1 tablet (88 mcg total) by mouth daily before breakfast., Disp: 30 tablet, Rfl: 4   loratadine (CLARITIN) 10 MG tablet, Take 1 tablet (10 mg total) by mouth daily., Disp: 30 tablet, Rfl: 4   losartan-hydrochlorothiazide (HYZAAR) 100-12.5 MG tablet, Take 1 tablet by mouth daily., Disp: 30 tablet, Rfl: 4   meloxicam (MOBIC) 7.5 MG tablet, Take 1 tablet (7.5 mg total) by mouth 2 (two) times daily., Disp: 60 tablet, Rfl: 4   Potassium Gluconate 595 MG CAPS, Take by mouth., Disp: , Rfl:   Review of Systems  Constitutional: Positive for activity change.       Patient less active recently due to pain and swelling of the right anle and foot.   HENT: Negative.   Respiratory: Positive for chest tightness and shortness of breath.   Gastrointestinal: Negative.   Endocrine: Negative.   Genitourinary: Negative.   Musculoskeletal: Positive for arthralgias and joint swelling.       Rports right ankle pain and swelling. Reduced RO< with no loss of strength. States that pain is worse with  exertion and putting weight on her right foot.   Skin: Negative.   Allergic/Immunologic: Negative.   Neurological: Negative.   Hematological: Negative.   Psychiatric/Behavioral: Negative.        Today's Vitals   02/13/17 1631  BP: (!) 141/77  Pulse: 68  Resp: 16  SpO2: 95%  Weight: 267 lb 3.2 oz (121.2 kg)  Height: 5\' 4"  (1.626 m)   Objective:   Physical Exam  Constitutional: She is oriented to person, place, and time. She appears well-developed and well-nourished.  HENT:  Head: Normocephalic.  Eyes: Pupils are equal, round, and reactive to light.  Neck: Normal range of motion. Neck supple. Carotid bruit is not present.  Cardiovascular: Normal rate and regular rhythm.  Pulmonary/Chest: Effort normal and breath sounds normal.  Abdominal: Soft. There is no tenderness.  Musculoskeletal:       Feet:  There is tenderness and moderate swelling of the right ankle, both lateral and medial aspects. There is pain with medium palpation of the heel and inner arch. Swelling stretches across the top of the foot, to proximal joints of the toes. ROM is mildly decreased due to pain and swelling. Strength is intact. She does walk with a mild limp, favoring the right side.   Neurological: She is alert and oriented to person, place, and time.  Skin: Skin is warm and dry.  Psychiatric: She has a normal  mood and affect.       Assessment:     Pain in right ankle and joints of right foot - Plan: DG Foot Complete Right  Essential hypertension,  - Plan: furosemide (LASIX) 20 MG tablet  Asthma in adult, mild intermittent, with acute exacerbation - Plan: loratadine (CLARITIN) 10 MG tablet, Fluticasone-Salmeterol (ADVAIR) 250-50 MCG/DOSE AEPB, fluticasone (FLONASE) 50 MCG/ACT nasal spray  Mixed hyperglyceridemia - Plan: atorvastatin (LIPITOR) 10 MG tablet  Gastroesophageal reflux disease with esophagitis - Plan: esomeprazole (NEXIUM) 40 MG capsule  Acquired hypothyroidism - Plan: levothyroxine  (SYNTHROID, LEVOTHROID) 88 MCG tablet     Plan:     1. Will get x-ray of right foot for further evaluation. Encouraged her to take meloxicam to reduce pain and inflammation. Will have her rest, ice, and elevate the right foot to improve pain.  2. bp generally stable. Continue bp meds as prescribed  3. Mild, intermittent asthma - use inhalers as needed and as prescribed  4. Continue cholesterol medication as prescribed  5. Continue nexium as prescribed  6. Thyroid panel stable. Continue levothyroxine as prescribed.    Follow up 4 months and sooner if needed

## 2017-02-14 ENCOUNTER — Ambulatory Visit: Payer: Self-pay | Admitting: Nurse Practitioner

## 2017-02-21 ENCOUNTER — Other Ambulatory Visit: Payer: Self-pay | Admitting: Nurse Practitioner

## 2017-02-22 ENCOUNTER — Other Ambulatory Visit: Payer: Self-pay | Admitting: Nurse Practitioner

## 2017-02-22 DIAGNOSIS — R52 Pain, unspecified: Secondary | ICD-10-CM

## 2017-02-25 ENCOUNTER — Other Ambulatory Visit: Payer: Self-pay | Admitting: Internal Medicine

## 2017-03-05 ENCOUNTER — Ambulatory Visit
Admission: RE | Admit: 2017-03-05 | Discharge: 2017-03-05 | Disposition: A | Discharge status: Home / Self Care | Payer: Medicaid Other | Source: Ambulatory Visit | Attending: Nurse Practitioner | Admitting: Nurse Practitioner

## 2017-03-05 ENCOUNTER — Ambulatory Visit
Admission: RE | Admit: 2017-03-05 | Discharge: 2017-03-05 | Disposition: A | Discharge status: Home / Self Care | Payer: Medicaid Other | Source: Ambulatory Visit | Attending: Internal Medicine | Admitting: Internal Medicine

## 2017-03-05 ENCOUNTER — Other Ambulatory Visit: Payer: Self-pay | Admitting: Internal Medicine

## 2017-03-05 DIAGNOSIS — M25579 Pain in unspecified ankle and joints of unspecified foot: Secondary | ICD-10-CM | POA: Insufficient documentation

## 2017-03-05 DIAGNOSIS — M7731 Calcaneal spur, right foot: Secondary | ICD-10-CM | POA: Diagnosis not present

## 2017-03-05 DIAGNOSIS — M25561 Pain in right knee: Secondary | ICD-10-CM | POA: Diagnosis not present

## 2017-03-05 DIAGNOSIS — M19071 Primary osteoarthritis, right ankle and foot: Secondary | ICD-10-CM | POA: Insufficient documentation

## 2017-03-05 DIAGNOSIS — R52 Pain, unspecified: Secondary | ICD-10-CM

## 2017-03-05 DIAGNOSIS — M1712 Unilateral primary osteoarthritis, left knee: Secondary | ICD-10-CM | POA: Insufficient documentation

## 2017-03-20 ENCOUNTER — Other Ambulatory Visit: Payer: Self-pay

## 2017-03-20 DIAGNOSIS — J4521 Mild intermittent asthma with (acute) exacerbation: Secondary | ICD-10-CM

## 2017-03-20 MED ORDER — FLUTICASONE-SALMETEROL 250-50 MCG/DOSE IN AEPB: 1.0000 | INHALATION_SPRAY | Freq: Two times a day (BID) | RESPIRATORY_TRACT | 4 refills | Status: DC

## 2017-03-23 ENCOUNTER — Ambulatory Visit: Payer: Self-pay | Admitting: Nurse Practitioner

## 2017-03-28 ENCOUNTER — Other Ambulatory Visit: Payer: Self-pay

## 2017-03-28 MED ORDER — MECLIZINE HCL 25 MG PO TABS: 25.0000 mg | ORAL_TABLET | Freq: Three times a day (TID) | ORAL | 3 refills | Status: DC | PRN

## 2017-05-15 NOTE — Progress Notes (Signed)
Patient scheduled to see Dr Cleda Mccreedy on Feb 20th @ 10:00 [facility] Podiatry/[place]

## 2017-05-21 NOTE — Progress Notes (Signed)
Patient's son has been advised of her Gray appointment via voicemail. Patent scheduled to see Dr Candelaria Stagers on 05/25/17 @ 2:30 Brianna Gray (right knee)[place]

## 2017-06-05 ENCOUNTER — Ambulatory Visit: Payer: Self-pay | Admitting: Internal Medicine

## 2017-06-06 ENCOUNTER — Ambulatory Visit: Payer: Self-pay | Admitting: Internal Medicine

## 2017-07-02 ENCOUNTER — Other Ambulatory Visit: Payer: Self-pay | Admitting: Internal Medicine

## 2017-08-10 ENCOUNTER — Other Ambulatory Visit: Payer: Self-pay | Admitting: Internal Medicine

## 2017-10-03 ENCOUNTER — Other Ambulatory Visit: Payer: Self-pay

## 2017-10-03 MED ORDER — LOSARTAN POTASSIUM-HCTZ 100-25 MG PO TABS: 1.0000 | ORAL_TABLET | Freq: Every day | ORAL | 5 refills | Status: DC

## 2017-12-18 ENCOUNTER — Encounter: Payer: Self-pay | Admitting: Internal Medicine

## 2017-12-18 ENCOUNTER — Ambulatory Visit: Payer: Medicaid Other | Admitting: Internal Medicine

## 2017-12-18 VITALS — BP 152/83 | HR 67 | Resp 16 | Ht 64.0 in | Wt 262.8 lb

## 2017-12-18 DIAGNOSIS — M25561 Pain in right knee: Secondary | ICD-10-CM | POA: Diagnosis not present

## 2017-12-18 DIAGNOSIS — E783 Hyperchylomicronemia: Secondary | ICD-10-CM | POA: Diagnosis not present

## 2017-12-18 DIAGNOSIS — H608X3 Other otitis externa, bilateral: Secondary | ICD-10-CM

## 2017-12-18 DIAGNOSIS — J45909 Unspecified asthma, uncomplicated: Secondary | ICD-10-CM

## 2017-12-18 DIAGNOSIS — I10 Essential (primary) hypertension: Secondary | ICD-10-CM

## 2017-12-18 DIAGNOSIS — Z1231 Encounter for screening mammogram for malignant neoplasm of breast: Secondary | ICD-10-CM

## 2017-12-18 DIAGNOSIS — M25562 Pain in left knee: Secondary | ICD-10-CM

## 2017-12-18 DIAGNOSIS — E039 Hypothyroidism, unspecified: Secondary | ICD-10-CM

## 2017-12-18 DIAGNOSIS — G8929 Other chronic pain: Secondary | ICD-10-CM

## 2017-12-18 MED ORDER — HYDROCORTISONE-ACETIC ACID 1-2 % OT SOLN: OTIC | 0 refills | Status: DC

## 2017-12-18 MED ORDER — OLOPATADINE HCL 0.1 % OP SOLN
1.0000 [drp] | Freq: Two times a day (BID) | OPHTHALMIC | 12 refills | Status: DC
Start: 2017-12-18 — End: 2019-01-28

## 2017-12-18 MED ORDER — FLUTICASONE PROPIONATE 50 MCG/ACT NA SUSP: 1.0000 | Freq: Every day | NASAL | 2 refills | Status: AC | PRN

## 2017-12-18 MED ORDER — ADVAIR DISKUS 250-50 MCG/DOSE IN AEPB: 1.0000 | INHALATION_SPRAY | Freq: Two times a day (BID) | RESPIRATORY_TRACT | 12 refills | Status: DC | PRN

## 2017-12-18 NOTE — Progress Notes (Signed)
[facility] [place], [place] [address]  Internal MEDICINE  Office Visit Note  Patient Name: Brianna Gray  [id number]  [id number]  Date of Service: 12/18/2017  Chief Complaint  Patient presents with  . Pain    leg pain in both legs, more ain in the right leg pain has been going on for 2-29yrs. some stomach pain on and off    HPI Pt has multiple complaints today 1. Allergies are bother her, ears and eyes itch all the time, has PND 2. Knees have been bothering her, and now her back hurts, will like to see ortho 3. Struggling with weight 4. BLE due to varicose veins. She is not active   Current Medication: Outpatient Encounter Medications as of 12/18/2017  Medication Sig Note  . ADVAIR DISKUS 250-50 MCG/DOSE AEPB Inhale 1 puff into the lungs 2 (two) times daily as needed. For respiratory issues.   Marland Kitchen atorvastatin (LIPITOR) 10 MG tablet Take 10 mg by mouth every morning.  05/22/2016: Patient takes at noon.  . fluticasone (FLONASE) 50 MCG/ACT nasal spray Place 1-2 sprays into both nostrils daily as needed. For nasal congestion.   . furosemide (LASIX) 20 MG tablet Take 20 mg by mouth daily at 12 noon.    Marland Kitchen levothyroxine (SYNTHROID, LEVOTHROID) 88 MCG tablet Take 88 mcg by mouth daily before breakfast. Take on an empty stomach.   . loratadine (CLARITIN) 10 MG tablet TAKE 1 TABLET BY MOUTH EVERY DAY   . losartan-hydrochlorothiazide (HYZAAR) 100-25 MG tablet Take 1 tablet by mouth daily.   . meclizine (ANTIVERT) 25 MG tablet Take 1 tablet (25 mg total) by mouth 3 (three) times daily as needed for dizziness.   . meloxicam (MOBIC) 7.5 MG tablet TAKE 1 TABLET BY MOUTH TWICE A DAY FOR LOWER BACK AND JOINT PAINS   . NEXIUM 40 MG capsule Take 40 mg by mouth daily at 12 noon.   . Potassium 99 MG TABS Take 99 mg by mouth daily at 12 noon.   . vitamin B-12 (CYANOCOBALAMIN) 1000 MCG tablet Take 1,000 mcg by mouth daily at 12 noon.   . [DISCONTINUED] ADVAIR DISKUS 250-50  MCG/DOSE AEPB Take 1 puff by mouth 2 (two) times daily as needed. For respiratory issues.   . [DISCONTINUED] fluticasone (FLONASE) 50 MCG/ACT nasal spray Place 1-2 sprays into both nostrils daily as needed. For nasal congestion.   Marland Kitchen acetic acid-hydrocortisone (VOSOL-HC) OTIC solution Use as needed once a day only for itching in ears   . Naphazoline-Glycerin (REDNESS RELIEF OP) Place 1-2 Bars into both eyes 2 (two) times daily as needed (for eye irritation).   Marland Kitchen olopatadine (PATANOL) 0.1 % ophthalmic solution Place 1 drop into both eyes 2 (two) times daily.   Marland Kitchen oxyCODONE-acetaminophen (PERCOCET/ROXICET) 5-325 MG tablet Take 1-2 tablets by mouth every 4 (four) hours as needed (moderate to severe pain (when tolerating fluids)). (Patient not taking: Reported on 12/18/2017)   . [DISCONTINUED] medroxyPROGESTERone (PROVERA) 10 MG tablet Take 20 mg by mouth daily at 12 noon.    No facility-administered encounter medications on file as of 12/18/2017.     Surgical History: Past Surgical History:  Procedure Laterality Date  . CHOLECYSTECTOMY    . HERNIA REPAIR    . HERNIA REPAIR    . HYSTEROSCOPY W/D&C N/A 03/19/2015   Procedure: DILATATION AND CURETTAGE /HYSTEROSCOPY and polypectomy;  Surgeon: Honor Loh Ward, MD;  Location: [facility];  Service: Gynecology;  Laterality: N/A;  . LAPAROSCOPIC HYSTERECTOMY N/A 06/08/2016  Procedure: HYSTERECTOMY TOTAL LAPAROSCOPIC;  Surgeon: Gae Dry, MD;  Location: [facility];  Service: Gynecology;  Laterality: N/A;  . LAPAROSCOPIC SALPINGO OOPHERECTOMY Bilateral 06/08/2016   Procedure: LAPAROSCOPIC SALPINGO OOPHORECTOMY;  Surgeon: Gae Dry, MD;  Location: [facility];  Service: Gynecology;  Laterality: Bilateral;  . LAPAROTOMY N/A 06/08/2016   Procedure: LAPAROTOMY;  Surgeon: Gae Dry, MD;  Location: [facility];  Service: Gynecology;  Laterality: N/A;    Medical History: Past Medical History:  Diagnosis Date  . Asthma   . Cardiomyopathy ([place])   . Carpal  tunnel syndrome   . Carpal tunnel syndrome   . Dizziness   . Edema   . Elevated lipids   . GERD (gastroesophageal reflux disease)   . Hypertension   . Lower extremity edema   . Seasonal allergies   . Sleep apnea   . Wheezing     Family History: Family History  Problem Relation Age of Onset  . Diabetes Brother   . Hypertension Brother   . Breast cancer Neg Hx     Social History   Socioeconomic History  . Marital status: Married    Spouse name: Not on file  . Number of children: Not on file  . Years of education: Not on file  . Highest education level: Not on file  Occupational History  . Not on file  Social Needs  . Financial resource strain: Not on file  . Food insecurity:    Worry: Not on file    Inability: Not on file  . Transportation needs:    Medical: Not on file    Non-medical: Not on file  Tobacco Use  . Smoking status: Never Smoker  . Smokeless tobacco: Current User    Types: Snuff  Substance and Sexual Activity  . Alcohol use: No  . Drug use: No  . Sexual activity: Never  Lifestyle  . Physical activity:    Days per week: Not on file    Minutes per session: Not on file  . Stress: Not on file  Relationships  . Social connections:    Talks on phone: Not on file    Gets together: Not on file    Attends religious service: Not on file    Active member of club or organization: Not on file    Attends meetings of clubs or organizations: Not on file    Relationship status: Not on file  . Intimate partner violence:    Fear of current or ex partner: Not on file    Emotionally abused: Not on file    Physically abused: Not on file    Forced sexual activity: Not on file  Other Topics Concern  . Not on file  Social History Narrative   ** Merged History Encounter **          Review of Systems  Constitutional: Negative for chills, diaphoresis and fatigue.  HENT: Positive for congestion and postnasal drip. Negative for ear pain and sinus pressure.    Eyes: Positive for itching. Negative for photophobia, discharge, redness and visual disturbance.  Respiratory: Negative for cough, shortness of breath and wheezing.   Cardiovascular: Negative for chest pain, palpitations and leg swelling.  Gastrointestinal: Negative for abdominal pain, constipation, diarrhea, nausea and vomiting.  Genitourinary: Negative for dysuria and flank pain.  Musculoskeletal: Negative for arthralgias, back pain, gait problem and neck pain.  Skin: Negative for color change.  Allergic/Immunologic: Negative for environmental allergies and food allergies.  Neurological: Negative for dizziness  and headaches.  Hematological: Does not bruise/bleed easily.  Psychiatric/Behavioral: Negative for agitation, behavioral problems (depression) and hallucinations.    Vital Signs: BP (!) 152/83 (BP Location: Right Arm, Patient Position: Sitting, Cuff Size: Large)   Pulse 67   Resp 16   Ht 5\' 4"  (1.626 m)   Wt 262 lb 12.8 oz (119.2 kg)   SpO2 94%   BMI 45.11 kg/m    Physical Exam  Constitutional: She is oriented to person, place, and time. She appears well-developed and well-nourished. No distress.  HENT:  Head: Normocephalic and atraumatic.  Mouth/Throat: Oropharynx is clear and moist. No oropharyngeal exudate.  Eyes: Pupils are equal, round, and reactive to light. EOM are normal.  Neck: Normal range of motion. Neck supple. No JVD present. No tracheal deviation present. No thyromegaly present.  Cardiovascular: Normal rate, regular rhythm and normal heart sounds. Exam reveals no gallop and no friction rub.  No murmur heard. Pulmonary/Chest: Effort normal. No respiratory distress. She has no wheezes. She has no rales. She exhibits no tenderness.  Abdominal: Soft. Bowel sounds are normal.  Musculoskeletal: She exhibits tenderness and deformity.  Lymphadenopathy:    She has no cervical adenopathy.  Neurological: She is alert and oriented to person, place, and time. No  cranial nerve deficit.  Skin: Skin is warm and dry. She is not diaphoretic.  Psychiatric: She has a normal mood and affect. Her behavior is normal. Judgment and thought content normal.   Assessment/Plan: 1. Chronic pain of both knees - Ambulatory referral to Orthopedic Surgery - CBC with Differential/Platelet; Future  2. Morbid obesity ([place])  - Encouraged weight loss and limitation in her calories  - CBC with Differential/Platelet; Future  3. Essential hypertension, benign - Controlled with meds Comprehensive metabolic panel  4. Primary hyperchylomicronemia - Lipid Panel With LDL/HDL Ratio; Future  5. Moderate asthma, unspecified whether complicated, unspecified whether persistent - fluticasone (FLONASE) 50 MCG/ACT nasal spray; Place 1-2 sprays into both nostrils daily as needed. For nasal congestion.  Dispense: 1 g; Refill: 2 - ADVAIR DISKUS 250-50 MCG/DOSE AEPB; Inhale 1 puff into the lungs 2 (two) times daily as needed. For respiratory issues.  Dispense: 60 each; Refill: 12 - olopatadine (PATANOL) 0.1 % ophthalmic solution; Place 1 drop into both eyes 2 (two) times daily.  Dispense: 5 mL; Refill: 12  6. Hypothyroidism, unspecified type - Continue Synthroid  - TSH; Future - T4, free; Future  7. Chronic eczematous otitis externa of both ears - acetic acid-hydrocortisone (VOSOL-HC) OTIC solution; Use as needed once a day only for itching in ears  Dispense: 10 mL; Refill: 0  General Counseling: Betsie verbalizes understanding of the findings of todays visit and agrees with plan of treatment. I have discussed any further diagnostic evaluation that may be needed or ordered today. We also reviewed her medications today. she has been encouraged to call the office with any questions or concerns that should arise related to todays visit.    Orders Placed This Encounter  Procedures  . CBC with Differential/Platelet  . Lipid Panel With LDL/HDL Ratio  . TSH  . T4, free  . Comprehensive  metabolic panel  . Ambulatory referral to Orthopedic Surgery    Meds ordered this encounter  Medications  . fluticasone (FLONASE) 50 MCG/ACT nasal spray    Sig: Place 1-2 sprays into both nostrils daily as needed. For nasal congestion.    Dispense:  1 g    Refill:  2  . ADVAIR DISKUS 250-50 MCG/DOSE AEPB  Sig: Inhale 1 puff into the lungs 2 (two) times daily as needed. For respiratory issues.    Dispense:  60 each    Refill:  12  . olopatadine (PATANOL) 0.1 % ophthalmic solution    Sig: Place 1 drop into both eyes 2 (two) times daily.    Dispense:  5 mL    Refill:  12  . acetic acid-hydrocortisone (VOSOL-HC) OTIC solution    Sig: Use as needed once a day only for itching in ears    Dispense:  10 mL    Refill:  0    Time spent:25 Minutes    Dr Lavera Guise Internal medicine

## 2017-12-20 LAB — COMPREHENSIVE METABOLIC PANEL
A/G RATIO: 1.5 (ref 1.2–2.2)
ALT: 22 IU/L (ref 0–32)
AST: 18 IU/L (ref 0–40)
Albumin: 4.1 g/dL (ref 3.6–4.8)
Alkaline Phosphatase: 82 IU/L (ref 39–117)
BUN/Creatinine Ratio: 22 (ref 12–28)
BUN: 19 mg/dL (ref 8–27)
Bilirubin Total: 0.4 mg/dL (ref 0.0–1.2)
CALCIUM: 9.9 mg/dL (ref 8.7–10.3)
CO2: 25 mmol/L (ref 20–29)
Chloride: 100 mmol/L (ref 96–106)
Creatinine, Ser: 0.85 mg/dL (ref 0.57–1.00)
GFR, EST AFRICAN AMERICAN: 81 mL/min/{1.73_m2} (ref >59)
GFR, EST NON AFRICAN AMERICAN: 71 mL/min/{1.73_m2} (ref >59)
GLOBULIN, TOTAL: 2.8 g/dL (ref 1.5–4.5)
Glucose: 106 mg/dL — ABNORMAL HIGH (ref 65–99)
Potassium: 4.1 mmol/L (ref 3.5–5.2)
SODIUM: 138 mmol/L (ref 134–144)
TOTAL PROTEIN: 6.9 g/dL (ref 6.0–8.5)

## 2017-12-24 ENCOUNTER — Other Ambulatory Visit: Payer: Self-pay | Admitting: Adult Health

## 2017-12-24 MED ORDER — HYDROCHLOROTHIAZIDE 25 MG PO TABS: 25.0000 mg | ORAL_TABLET | Freq: Every day | ORAL | 3 refills | Status: DC

## 2017-12-24 MED ORDER — LOSARTAN POTASSIUM 100 MG PO TABS: 100.0000 mg | ORAL_TABLET | Freq: Every day | ORAL | 3 refills | Status: DC

## 2018-01-08 ENCOUNTER — Telehealth: Payer: Self-pay | Admitting: Internal Medicine

## 2018-01-08 NOTE — Telephone Encounter (Signed)
Prior authorization for vosol and patanol has been approved, PA# [id number] vosol good through 07/06/2018 Olapatadine good through  01/02/2019 PA# [phone] Pharmacy contacted.

## 2018-01-31 ENCOUNTER — Other Ambulatory Visit: Payer: Self-pay

## 2018-01-31 MED ORDER — MELOXICAM 7.5 MG PO TABS: ORAL_TABLET | ORAL | 3 refills | Status: DC

## 2018-02-02 ENCOUNTER — Other Ambulatory Visit: Payer: Self-pay | Admitting: Internal Medicine

## 2018-03-05 ENCOUNTER — Other Ambulatory Visit: Payer: Self-pay | Admitting: Internal Medicine

## 2018-03-12 IMAGING — CR DG CHEST 2V
1 series · 2 of 2 positions shown · non-contrast
Comparison: 03/09/2015

CLINICAL DATA: Preop evaluation for hysterectomy, asthma,
hypertension

EXAM:
CHEST  2 VIEW

[Series 1: dg chest 2 view · 0.14mm/px · 2 of 2 slices shown]
[im 1/2]
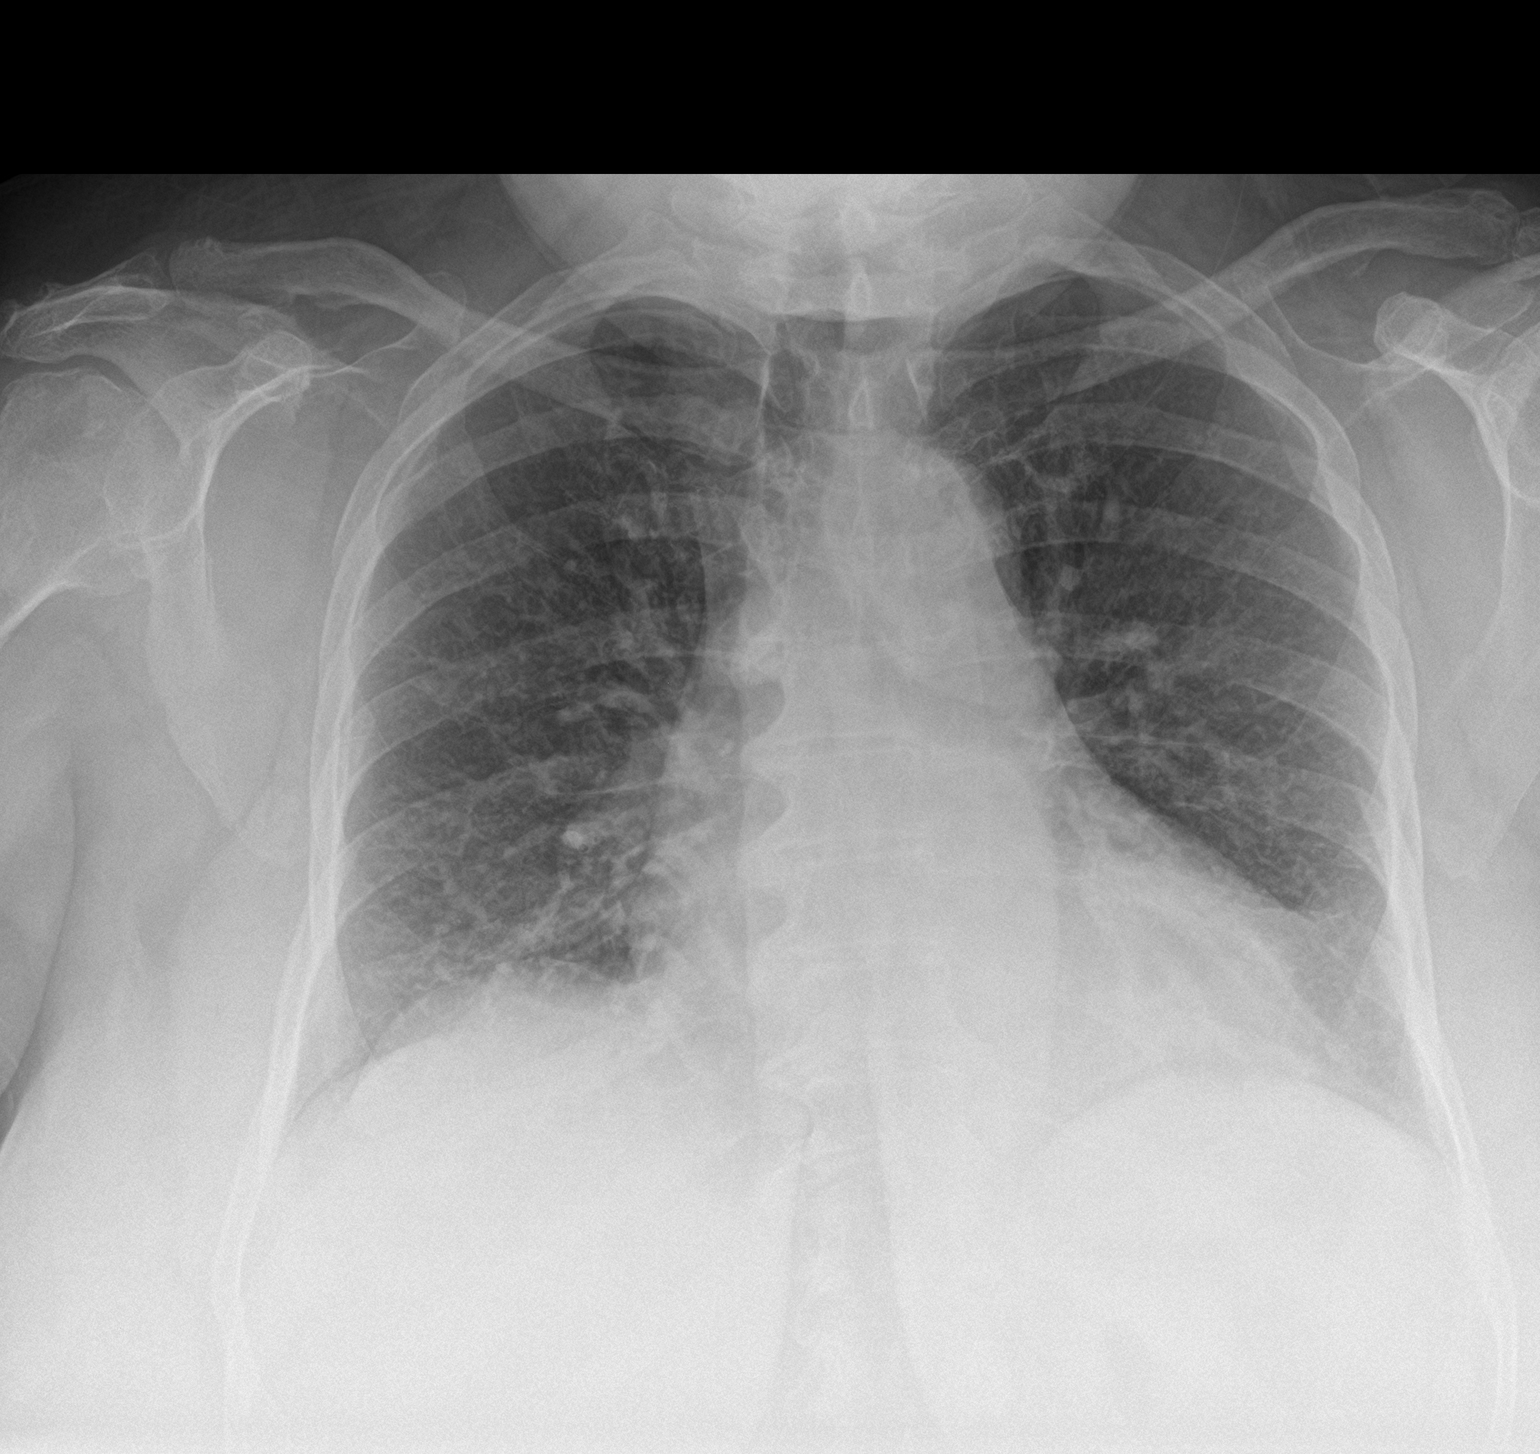
[im 2/2]
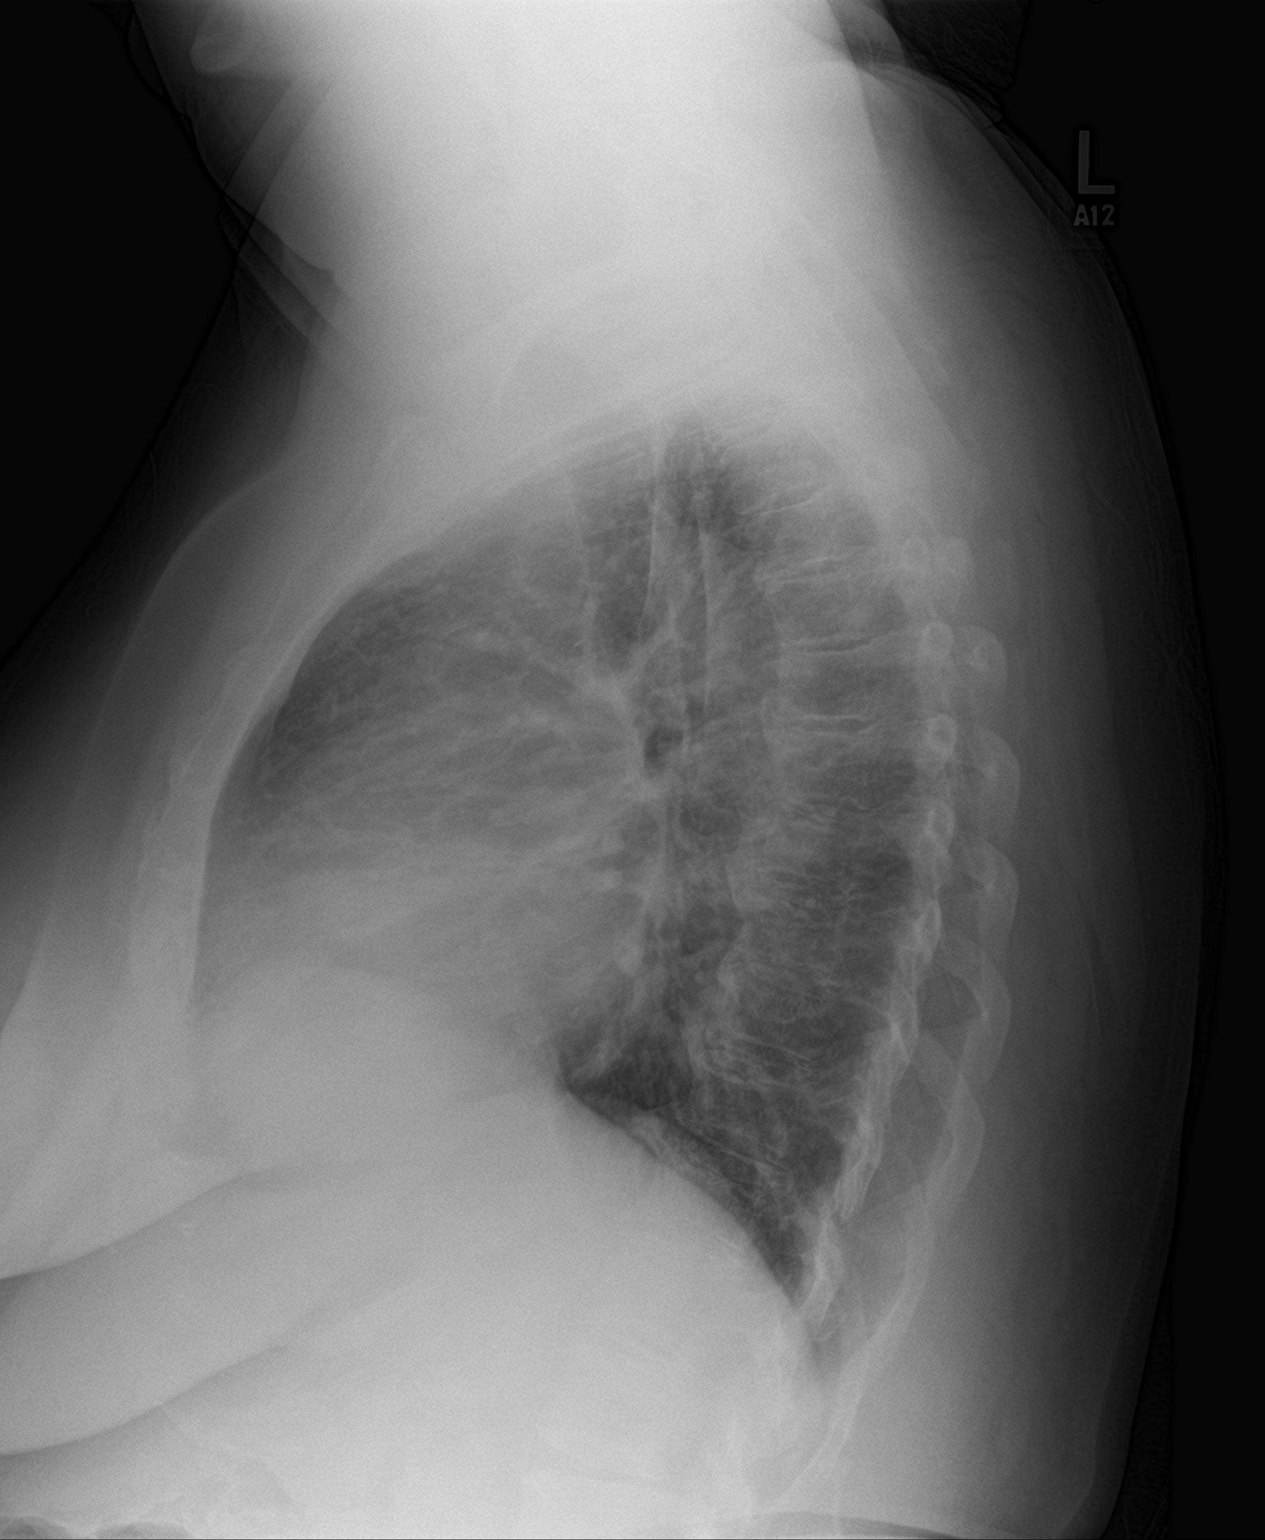

[2 of 2 positions shown; findings below may reference images not displayed]

FINDINGS: Normal heart size, mediastinal contours, and pulmonary vascularity.

Minimal atherosclerotic calcification aorta.

Mediastinal contours and pulmonary vascularity normal.

Bronchitic changes and slight chronic accentuation of interstitial
markings stable.

No acute infiltrate, pleural effusion or pneumothorax.

Bones demineralized with multilevel endplate spur formation thoracic
spine.
IMPRESSION: Enlargement of cardiac silhouette.

Aortic atherosclerosis.

Chronic bronchitic changes without acute infiltrate.

## 2018-04-05 ENCOUNTER — Other Ambulatory Visit: Payer: Self-pay | Admitting: Internal Medicine

## 2018-04-29 ENCOUNTER — Telehealth: Payer: Self-pay | Admitting: Internal Medicine

## 2018-04-29 NOTE — Telephone Encounter (Signed)
Left message to [email or web address].jw

## 2018-04-30 ENCOUNTER — Ambulatory Visit: Payer: Self-pay | Admitting: Internal Medicine

## 2018-05-02 ENCOUNTER — Ambulatory Visit: Payer: Self-pay | Admitting: Internal Medicine

## 2018-05-02 ENCOUNTER — Other Ambulatory Visit: Payer: Self-pay | Admitting: Adult Health

## 2018-05-02 ENCOUNTER — Other Ambulatory Visit: Payer: Self-pay | Admitting: Internal Medicine

## 2018-06-17 ENCOUNTER — Other Ambulatory Visit: Payer: Self-pay

## 2018-06-17 ENCOUNTER — Other Ambulatory Visit: Payer: Self-pay | Admitting: Internal Medicine

## 2018-06-17 MED ORDER — LORATADINE 10 MG PO TABS: 10.0000 mg | ORAL_TABLET | Freq: Every day | ORAL | 2 refills | Status: DC

## 2018-06-17 MED ORDER — ESOMEPRAZOLE MAGNESIUM 40 MG PO CPDR: DELAYED_RELEASE_CAPSULE | ORAL | 2 refills | Status: DC

## 2018-08-23 ENCOUNTER — Other Ambulatory Visit: Payer: Self-pay | Admitting: Internal Medicine

## 2018-10-01 ENCOUNTER — Other Ambulatory Visit: Payer: Self-pay | Admitting: Adult Health

## 2018-10-01 ENCOUNTER — Other Ambulatory Visit: Payer: Self-pay | Admitting: Internal Medicine

## 2018-12-22 IMAGING — CR DG KNEE COMPLETE 4+V*R*
1 series · 5 of 5 positions shown · non-contrast
Comparison: None

CLINICAL DATA: Knee pain, no recent injury

EXAM:
RIGHT KNEE - COMPLETE 4+ VIEW

[Series 1: dg knee complete 4 views right · 0.14mm/px · 5 of 5 slices shown]
[im 1/5]
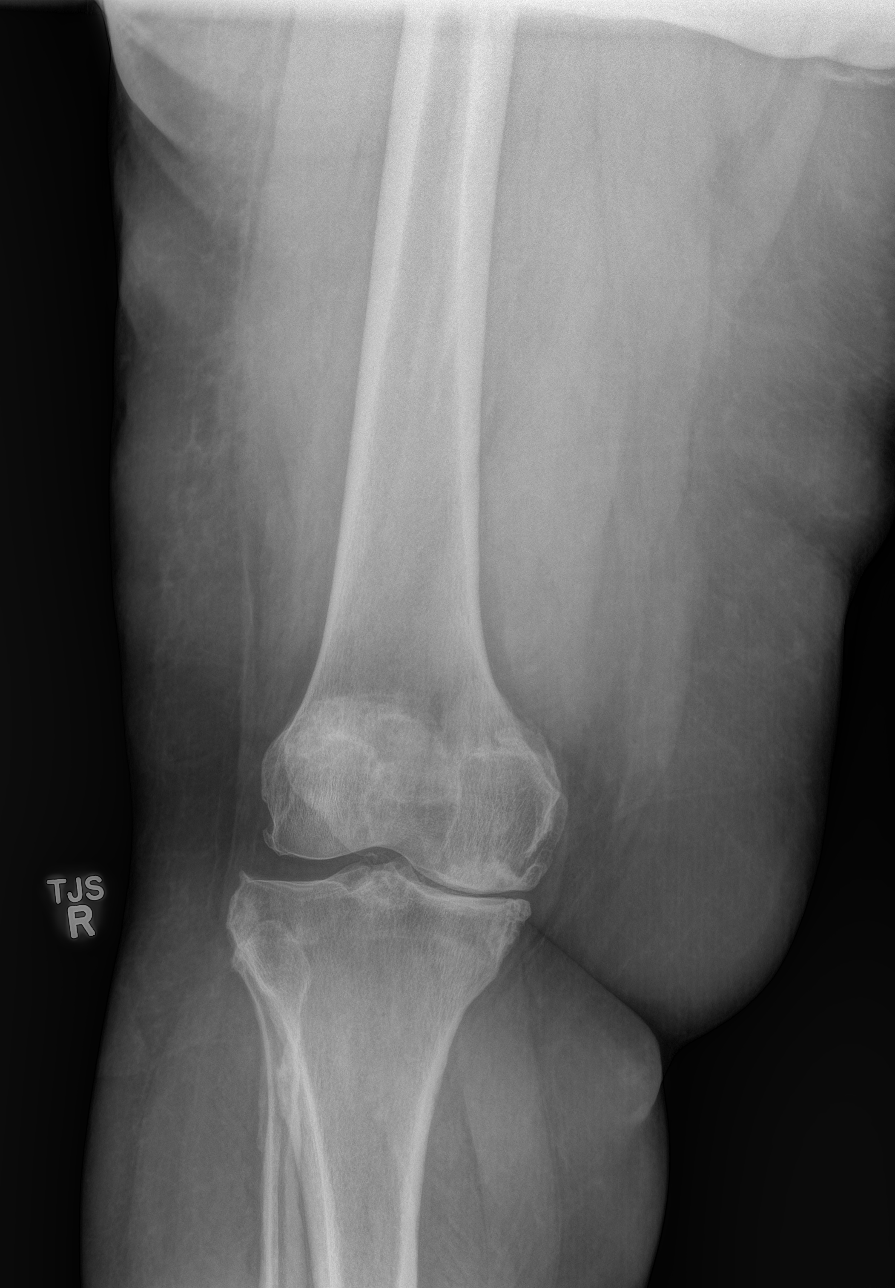
[im 2/5]
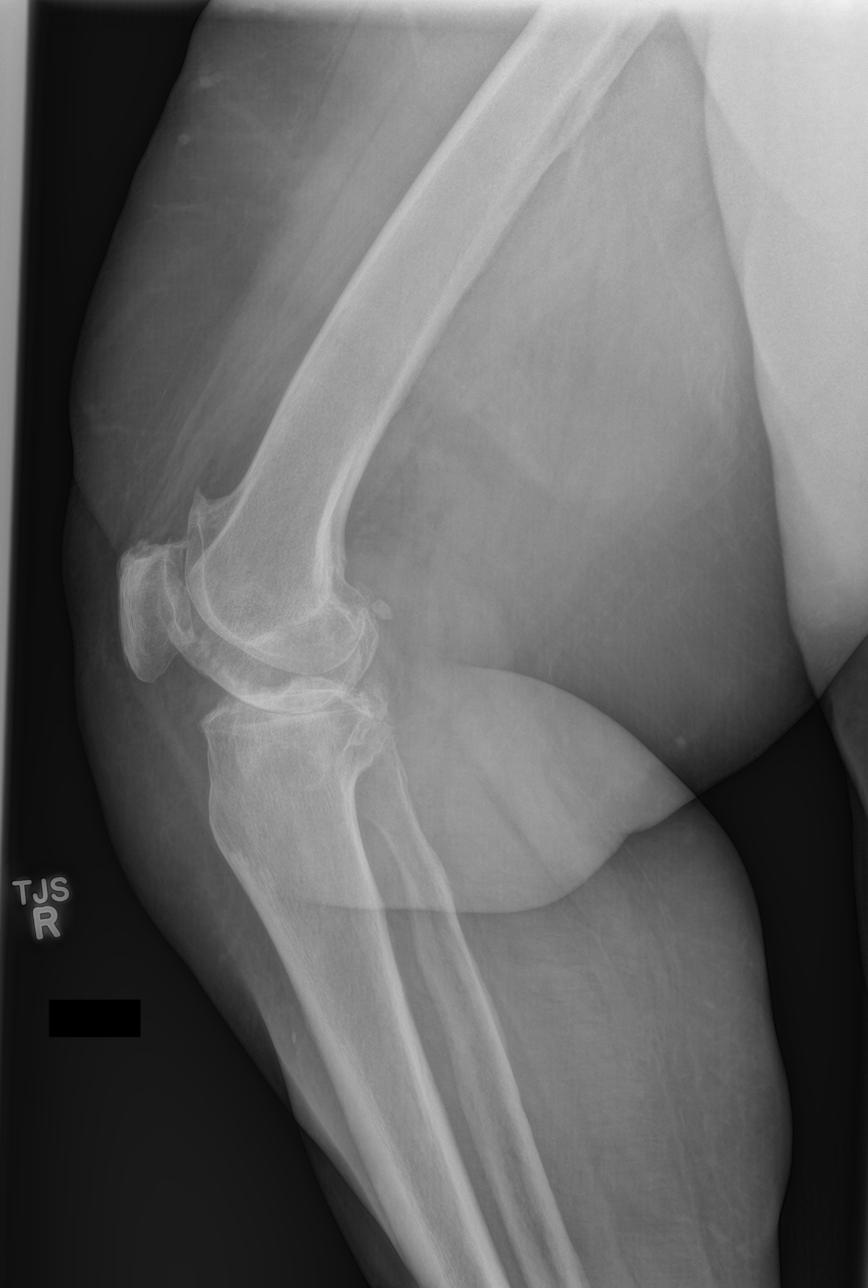
[im 3/5]
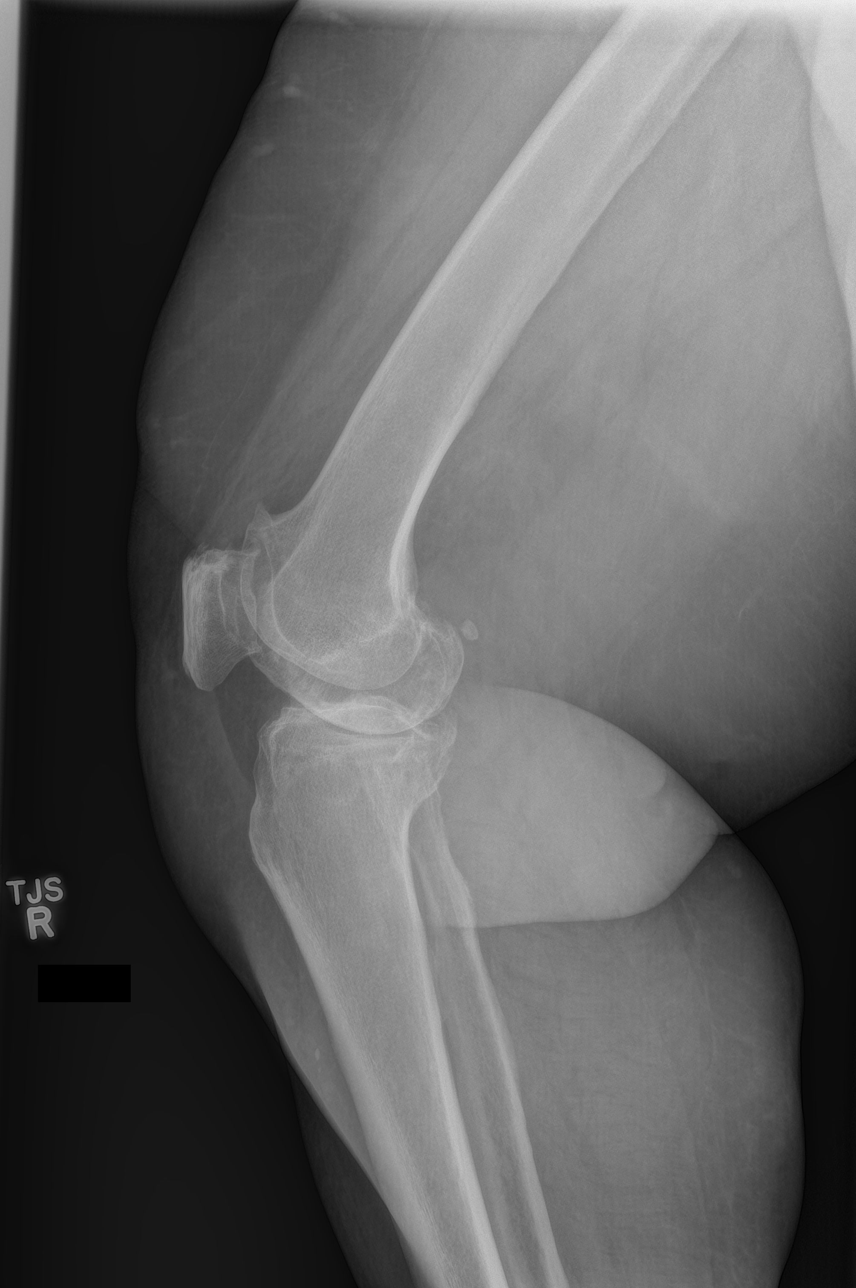
[im 4/5]
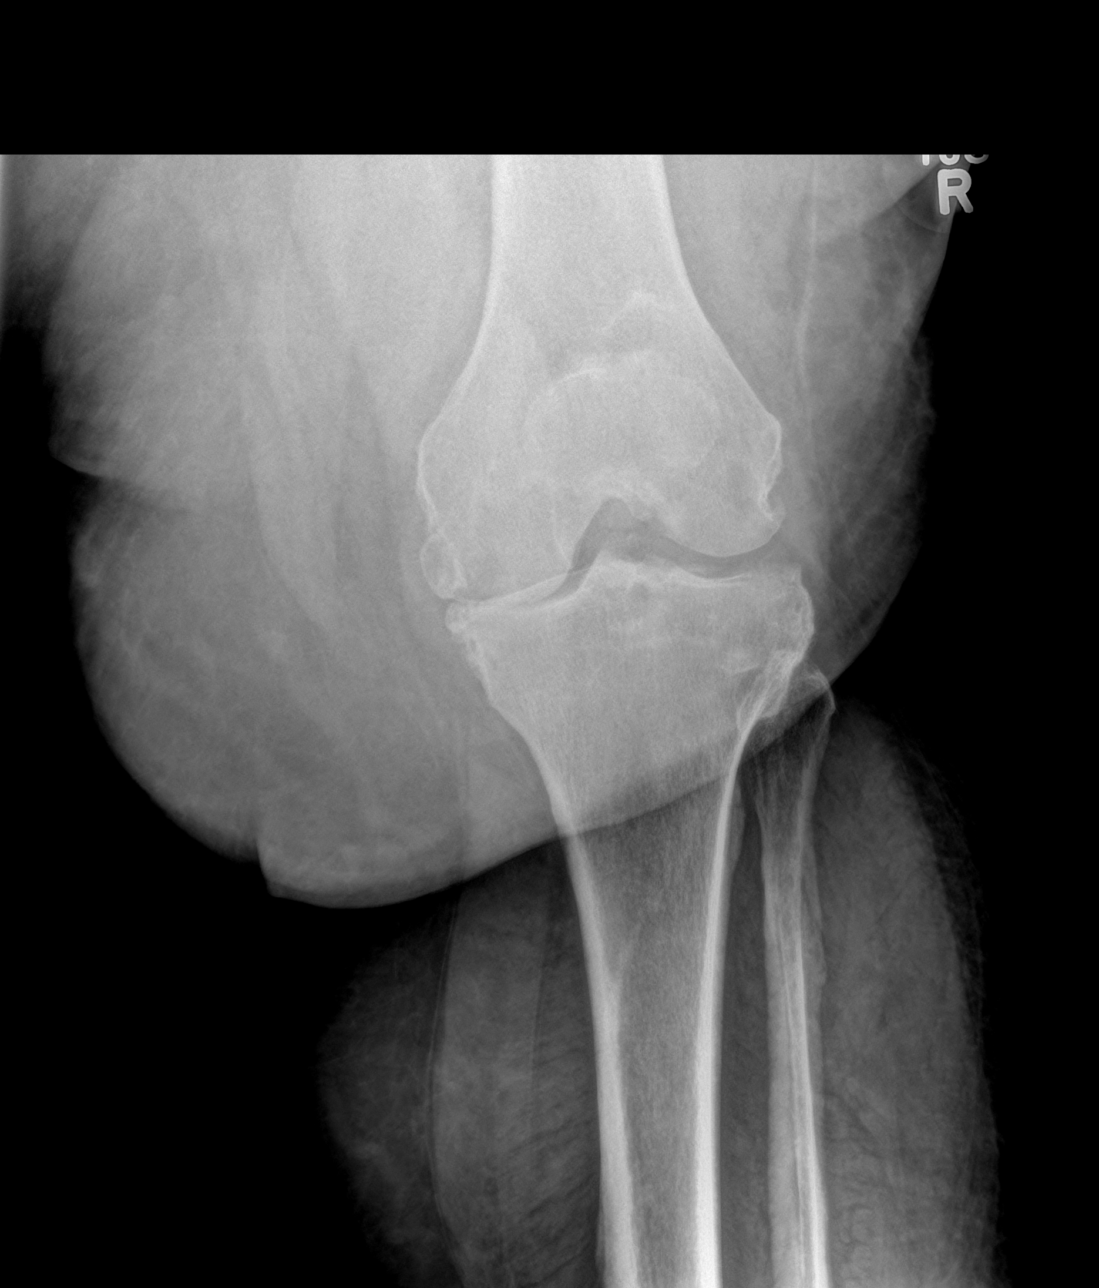
[im 5/5]
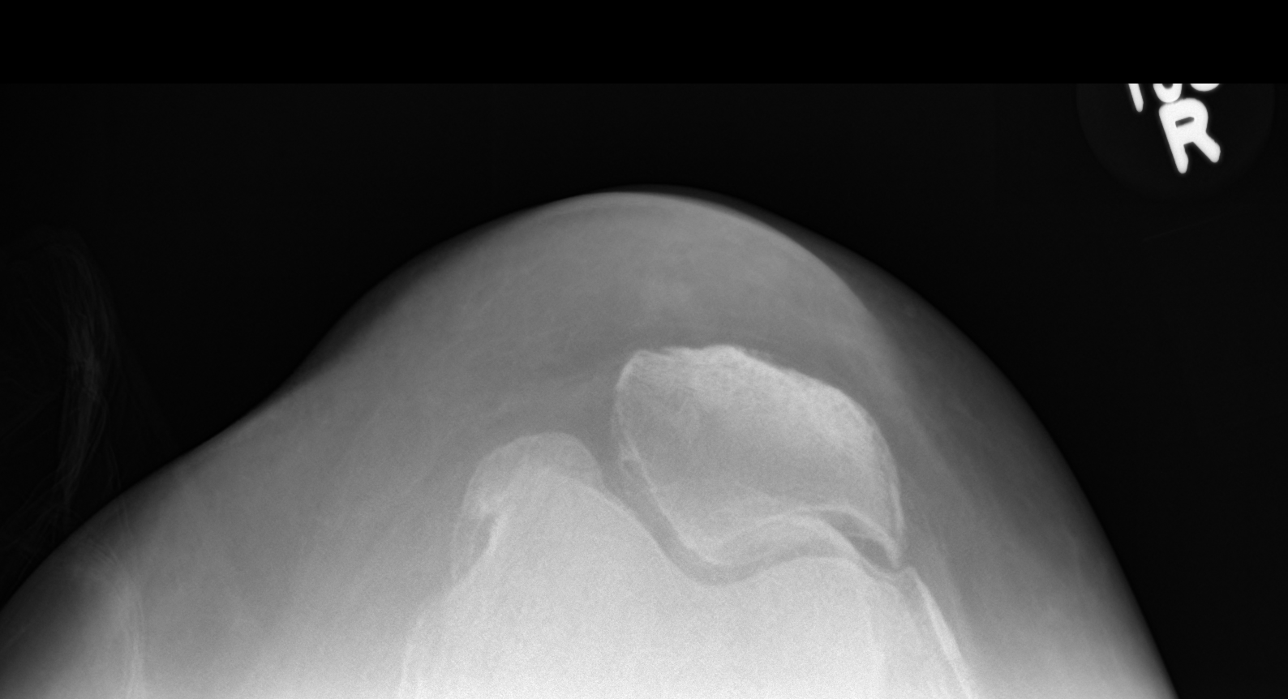

[5 of 5 positions shown; findings below may reference images not displayed]

FINDINGS: Advanced degenerative change in the medial compartment with marked
joint space narrowing and spurring. Widening of the lateral joint
space with its associated spurring. Patellofemoral degenerative
change in spurring.

Negative for fracture or effusion.
IMPRESSION: Advanced degenerative change.  Negative for fracture.

## 2019-01-15 ENCOUNTER — Other Ambulatory Visit: Payer: Self-pay | Admitting: Adult Health

## 2019-01-15 ENCOUNTER — Other Ambulatory Visit: Payer: Self-pay | Admitting: Internal Medicine

## 2019-01-21 ENCOUNTER — Telehealth: Payer: Self-pay

## 2019-01-21 NOTE — Telephone Encounter (Signed)
Confirmed appointment with patient. klh °

## 2019-01-27 ENCOUNTER — Other Ambulatory Visit: Payer: Self-pay | Admitting: Internal Medicine

## 2019-01-27 DIAGNOSIS — J45909 Unspecified asthma, uncomplicated: Secondary | ICD-10-CM

## 2019-01-28 ENCOUNTER — Other Ambulatory Visit: Payer: Self-pay

## 2019-01-28 ENCOUNTER — Ambulatory Visit: Payer: Medicaid Other | Admitting: Internal Medicine

## 2019-01-28 VITALS — BP 153/84 | HR 66 | Resp 16 | Ht 66.0 in | Wt 273.0 lb

## 2019-01-28 DIAGNOSIS — M25561 Pain in right knee: Secondary | ICD-10-CM | POA: Diagnosis not present

## 2019-01-28 DIAGNOSIS — I10 Essential (primary) hypertension: Secondary | ICD-10-CM | POA: Diagnosis not present

## 2019-01-28 DIAGNOSIS — E782 Mixed hyperlipidemia: Secondary | ICD-10-CM

## 2019-01-28 DIAGNOSIS — G8929 Other chronic pain: Secondary | ICD-10-CM

## 2019-01-28 DIAGNOSIS — E039 Hypothyroidism, unspecified: Secondary | ICD-10-CM

## 2019-01-28 DIAGNOSIS — J45909 Unspecified asthma, uncomplicated: Secondary | ICD-10-CM

## 2019-01-28 DIAGNOSIS — M25562 Pain in left knee: Secondary | ICD-10-CM

## 2019-01-28 MED ORDER — MONTELUKAST SODIUM 10 MG PO TABS: 10.0000 mg | ORAL_TABLET | Freq: Every day | ORAL | 3 refills | Status: DC

## 2019-01-28 MED ORDER — HYDRALAZINE HCL 10 MG PO TABS: ORAL_TABLET | ORAL | 3 refills | Status: DC

## 2019-01-28 NOTE — Progress Notes (Signed)
[facility] [address] [place] [address]  Internal MEDICINE  Office Visit Note  Patient Name: Brianna Gray  [id number]  [id number]  Date of Service: 02/05/2019  Chief Complaint  Patient presents with  . Hypertension  . Gastroesophageal Reflux  . Knee Pain  . Quality Metric Gaps    pna vacc, dexa scan, mammogram, colonoscopy    HPI Pt is seen for routine follow up today. She has multiple complaints 1. Weight gain due to her husband being sick and she cannot leave him 2. Bilateral knee pain, did see ortho, however will need knee replacement 3. C/O SOB with minimal exertion, denies any c/o 4. Problem sleeping at night, difficulty falling and maintaining her sleep    Current Medication: Outpatient Encounter Medications as of 01/28/2019  Medication Sig  . ADVAIR DISKUS 250-50 MCG/DOSE AEPB INHALE 1 PUFF INTO THE LUNGS 2 (TWO) TIMES DAILY AS NEEDED. FOR RESPIRATORY ISSUES.  Marland Kitchen atorvastatin (LIPITOR) 10 MG tablet TAKE 1 TABLET BY MOUTH AT BEDTIME  . esomeprazole (NEXIUM) 40 MG capsule TAKE 1 CAPSULE BY MOUTH EVERY DAY FOR STOMACH  . fluticasone (FLONASE) 50 MCG/ACT nasal spray Place 1-2 sprays into both nostrils daily as needed. For nasal congestion.  . furosemide (LASIX) 20 MG tablet Take 1 tablet (20 mg total) by mouth daily.  Marland Kitchen levothyroxine (SYNTHROID, LEVOTHROID) 88 MCG tablet TAKE 1 TABLET BY MOUTH EVERY MORNING ON EMPTY STOMACH**OK TO CHANGE TO LANNETT PER MD**  . loratadine (CLARITIN) 10 MG tablet TAKE 1 TABLET BY MOUTH EVERY DAY  . losartan (COZAAR) 100 MG tablet TAKE 1 TABLET BY MOUTH EVERY DAY  . meloxicam (MOBIC) 7.5 MG tablet TAKE 1 TABLET BY MOUTH TWICE A DAY FOR LOWER BACK AND JOINT PAINS  . Potassium 99 MG TABS Take 99 mg by mouth daily at 12 noon.  . vitamin B-12 (CYANOCOBALAMIN) 1000 MCG tablet Take 1,000 mcg by mouth daily at 12 noon.  . [DISCONTINUED] furosemide (LASIX) 20 MG tablet TAKE 1 TABLET BY MOUTH EVERY MORNING FOR SWELLING  .  [DISCONTINUED] hydrochlorothiazide (HYDRODIURIL) 25 MG tablet TAKE 1 TABLET BY MOUTH EVERY DAY  . montelukast (SINGULAIR) 10 MG tablet Take 1 tablet (10 mg total) by mouth at bedtime. For sinus congestion and allergies  . [DISCONTINUED] acetic acid-hydrocortisone (VOSOL-HC) OTIC solution Use as needed once a day only for itching in ears (Patient not taking: Reported on 01/28/2019)  . [DISCONTINUED] hydrALAZINE (APRESOLINE) 10 MG tablet Take one tab po bid for BP  . [DISCONTINUED] meclizine (ANTIVERT) 25 MG tablet Take 1 tablet (25 mg total) by mouth 3 (three) times daily as needed for dizziness. (Patient not taking: Reported on 01/28/2019)  . [DISCONTINUED] Naphazoline-Glycerin (REDNESS RELIEF OP) Place 1-2 Bars into both eyes 2 (two) times daily as needed (for eye irritation).  . [DISCONTINUED] olopatadine (PATANOL) 0.1 % ophthalmic solution Place 1 drop into both eyes 2 (two) times daily. (Patient not taking: Reported on 01/28/2019)  . [DISCONTINUED] oxyCODONE-acetaminophen (PERCOCET/ROXICET) 5-325 MG tablet Take 1-2 tablets by mouth every 4 (four) hours as needed (moderate to severe pain (when tolerating fluids)). (Patient not taking: Reported on 01/28/2019)   No facility-administered encounter medications on file as of 01/28/2019.     Surgical History: Past Surgical History:  Procedure Laterality Date  . CHOLECYSTECTOMY    . HERNIA REPAIR    . HERNIA REPAIR    . HYSTEROSCOPY W/D&C N/A 03/19/2015   Procedure: DILATATION AND CURETTAGE /HYSTEROSCOPY and polypectomy;  Surgeon: Honor Loh Ward, MD;  Location:  [facility] OR[facility];  Service: Gynecology;  Laterality: N/A;  . LAPAROSCOPIC HYSTERECTOMY N/A 06/08/2016   Procedure: HYSTERECTOMY TOTAL LAPAROSCOPIC;  Surgeon: Gae Dry, MD;  Location: [facility];  Service: Gynecology;  Laterality: N/A;  . LAPAROSCOPIC SALPINGO OOPHERECTOMY Bilateral 06/08/2016   Procedure: LAPAROSCOPIC SALPINGO OOPHORECTOMY;  Surgeon: Gae Dry, MD;  Location: [facility];  Service:  Gynecology;  Laterality: Bilateral;  . LAPAROTOMY N/A 06/08/2016   Procedure: LAPAROTOMY;  Surgeon: Gae Dry, MD;  Location: [facility];  Service: Gynecology;  Laterality: N/A;    Medical History: Past Medical History:  Diagnosis Date  . Asthma   . Cardiomyopathy ([place])   . Carpal tunnel syndrome   . Carpal tunnel syndrome   . Dizziness   . Edema   . Elevated lipids   . GERD (gastroesophageal reflux disease)   . Hypertension   . Lower extremity edema   . Seasonal allergies   . Sleep apnea   . Wheezing     Family History: Family History  Problem Relation Age of Onset  . Diabetes Brother   . Hypertension Brother   . Breast cancer Neg Hx     Social History   Socioeconomic History  . Marital status: Married    Spouse name: Not on file  . Number of children: Not on file  . Years of education: Not on file  . Highest education level: Not on file  Occupational History  . Not on file  Social Needs  . Financial resource strain: Not on file  . Food insecurity    Worry: Not on file    Inability: Not on file  . Transportation needs    Medical: Not on file    Non-medical: Not on file  Tobacco Use  . Smoking status: Never Smoker  . Smokeless tobacco: Current User    Types: Snuff  Substance and Sexual Activity  . Alcohol use: No  . Drug use: No  . Sexual activity: Never  Lifestyle  . Physical activity    Days per week: Not on file    Minutes per session: Not on file  . Stress: Not on file  Relationships  . Social Herbalist on phone: Not on file    Gets together: Not on file    Attends religious service: Not on file    Active member of club or organization: Not on file    Attends meetings of clubs or organizations: Not on file    Relationship status: Not on file  . Intimate partner violence    Fear of current or ex partner: Not on file    Emotionally abused: Not on file    Physically abused: Not on file    Forced sexual activity: Not on file   Other Topics Concern  . Not on file  Social History Narrative   ** Merged History Encounter **         Review of Systems  Constitutional: Positive for activity change. Negative for chills, diaphoresis and fatigue.  HENT: Negative for ear pain, postnasal drip and sinus pressure.   Eyes: Negative for photophobia, discharge, redness, itching and visual disturbance.  Respiratory: Positive for shortness of breath. Negative for cough and wheezing.   Cardiovascular: Positive for leg swelling. Negative for chest pain and palpitations.  Gastrointestinal: Negative for abdominal pain, constipation, diarrhea, nausea and vomiting.  Genitourinary: Negative for dysuria and flank pain.  Musculoskeletal: Positive for arthralgias, gait problem and joint swelling. Negative for back pain and  neck pain.  Skin: Negative for color change.  Allergic/Immunologic: Negative for environmental allergies and food allergies.  Neurological: Negative for dizziness and headaches.  Hematological: Does not bruise/bleed easily.  Psychiatric/Behavioral: Negative for agitation, behavioral problems (depression) and hallucinations.    Vital Signs: BP (!) 153/84   Pulse 66   Resp 16   Ht 5\' 6"  (1.676 m)   Wt 273 lb (123.8 kg)   SpO2 96%   BMI 44.06 kg/m    Physical Exam Constitutional:      General: She is not in acute distress.    Appearance: She is well-developed. She is obese. She is not diaphoretic.  HENT:     Head: Normocephalic and atraumatic.     Mouth/Throat:     Pharynx: No oropharyngeal exudate.  Eyes:     Pupils: Pupils are equal, round, and reactive to light.  Neck:     Musculoskeletal: Normal range of motion.     Thyroid: No thyromegaly.     Vascular: No JVD.     Trachea: No tracheal deviation.  Cardiovascular:     Rate and Rhythm: Normal rate and regular rhythm.     Heart sounds: Normal heart sounds. No murmur. No friction rub. No gallop.   Pulmonary:     Effort: Pulmonary effort is  normal. No respiratory distress.     Breath sounds: Wheezing present. No rales.  Chest:     Chest wall: No tenderness.  Abdominal:     General: Bowel sounds are normal.     Palpations: Abdomen is soft.  Musculoskeletal:        General: Swelling and tenderness present.     Left lower leg: Edema present.  Lymphadenopathy:     Cervical: No cervical adenopathy.  Skin:    General: Skin is warm and dry.  Neurological:     Mental Status: She is alert and oriented to person, place, and time.     Cranial Nerves: No cranial nerve deficit.  Psychiatric:        Behavior: Behavior normal.        Thought Content: Thought content normal.        Judgment: Judgment normal.    Assessment/Plan: 1. Chronic pain of both knees - Pt continues to have walking difficulty, she was advised to lose more weight before knee replacement  - Ambulatory referral to Orthopedic Surgery  2. Essential hypertension, benign - Uncontrolled HTN, pt is taking Lasix prn only, Will discontinue HCTZ for now, take Lasix 20 mg po qd, add Hydralazine 10 mg bid and continue Losartan 100 mg as before. Monitor renal functions as well  - Comprehensive metabolic panel - furosemide (LASIX) 20 MG tablet; Take 1 tablet (20 mg total) by mouth daily.  Dispense: 90 tablet; Refill: 4  3. Moderate asthma, unspecified whether complicated, unspecified whether persistent - Continue Adviar and add Montelukast  - CBC with Differential/Platelet; Future - montelukast (SINGULAIR) 10 MG tablet; Take 1 tablet (10 mg total) by mouth at bedtime. For sinus congestion and allergies  Dispense: 30 tablet; Refill: 3  4. Hypothyroidism, unspecified type - Continue Synthroid  - TSH; Future - T4, free; Future  5. Morbid obesity ([place]) - Encouraged her to follow a restricted calorie diet, low fat, low sodium   6. Mixed hyperlipidemia - Recheck fasting lipid profile  - Lipid Panel With LDL/HDL Ratio; Future - Comprehensive metabolic panel  General  Counseling: Shahida verbalizes understanding of the findings of todays visit and agrees with plan of treatment. I  have discussed any further diagnostic evaluation that may be needed or ordered today. We also reviewed her medications today. she has been encouraged to call the office with any questions or concerns that should arise related to todays visit. She wants to hold off on PHM inclulding mammogram due to covid.  Orders Placed This Encounter  Procedures  . CBC with Differential/Platelet  . Lipid Panel With LDL/HDL Ratio  . TSH  . T4, free  . Comprehensive metabolic panel  . Ambulatory referral to Orthopedic Surgery    Meds ordered this encounter  Medications  . montelukast (SINGULAIR) 10 MG tablet    Sig: Take 1 tablet (10 mg total) by mouth at bedtime. For sinus congestion and allergies    Dispense:  30 tablet    Refill:  3  . DISCONTD: hydrALAZINE (APRESOLINE) 10 MG tablet    Sig: Take one tab po bid for BP    Dispense:  90 tablet    Refill:  3  . furosemide (LASIX) 20 MG tablet    Sig: Take 1 tablet (20 mg total) by mouth daily.    Dispense:  90 tablet    Refill:  4    Time spent: 25Minutes  Dr Lavera Guise Internal medicine

## 2019-01-29 ENCOUNTER — Other Ambulatory Visit: Payer: Self-pay

## 2019-01-29 MED ORDER — HYDRALAZINE HCL 10 MG PO TABS: ORAL_TABLET | ORAL | 3 refills | Status: DC

## 2019-02-05 MED ORDER — FUROSEMIDE 20 MG PO TABS: 20.0000 mg | ORAL_TABLET | Freq: Every day | ORAL | 4 refills | Status: DC

## 2019-02-19 ENCOUNTER — Other Ambulatory Visit: Payer: Self-pay | Admitting: Internal Medicine

## 2019-02-24 ENCOUNTER — Other Ambulatory Visit: Payer: Self-pay | Admitting: Internal Medicine

## 2019-02-24 DIAGNOSIS — J45909 Unspecified asthma, uncomplicated: Secondary | ICD-10-CM

## 2019-02-24 MED ORDER — MONTELUKAST SODIUM 10 MG PO TABS: 10.0000 mg | ORAL_TABLET | Freq: Every day | ORAL | 3 refills | Status: AC

## 2019-02-24 MED ORDER — IPRATROPIUM-ALBUTEROL 0.5-2.5 (3) MG/3ML IN SOLN: RESPIRATORY_TRACT | 1 refills | Status: AC

## 2019-03-04 ENCOUNTER — Other Ambulatory Visit: Payer: Self-pay

## 2019-03-04 MED ORDER — NEBULIZER DEVI: 0 refills | Status: DC

## 2019-03-20 LAB — COMPREHENSIVE METABOLIC PANEL
ALT: 20 IU/L (ref 0–32)
AST: 17 IU/L (ref 0–40)
Albumin/Globulin Ratio: 1.3 (ref 1.2–2.2)
Albumin: 3.9 g/dL (ref 3.8–4.8)
Alkaline Phosphatase: 93 IU/L (ref 39–117)
BUN/Creatinine Ratio: 20 (ref 12–28)
BUN: 16 mg/dL (ref 8–27)
Bilirubin Total: 0.4 mg/dL (ref 0.0–1.2)
CO2: 26 mmol/L (ref 20–29)
Calcium: 9.9 mg/dL (ref 8.7–10.3)
Chloride: 102 mmol/L (ref 96–106)
Creatinine, Ser: 0.8 mg/dL (ref 0.57–1.00)
GFR calc Af Amer: 87 mL/min/{1.73_m2} (ref >59)
GFR calc non Af Amer: 75 mL/min/{1.73_m2} (ref >59)
Globulin, Total: 2.9 g/dL (ref 1.5–4.5)
Glucose: 114 mg/dL — ABNORMAL HIGH (ref 65–99)
Potassium: 4.4 mmol/L (ref 3.5–5.2)
Sodium: 141 mmol/L (ref 134–144)
Total Protein: 6.8 g/dL (ref 6.0–8.5)

## 2019-03-23 ENCOUNTER — Other Ambulatory Visit: Payer: Self-pay | Admitting: Internal Medicine

## 2019-03-24 NOTE — Progress Notes (Signed)
Can you check why other lab results are not available

## 2019-03-28 ENCOUNTER — Telehealth: Payer: Self-pay

## 2019-03-28 NOTE — Telephone Encounter (Signed)
Pt son requesting In office appt. Screened and confirmed 04-01-19 ov.

## 2019-04-01 ENCOUNTER — Encounter: Payer: Self-pay | Admitting: Internal Medicine

## 2019-04-01 ENCOUNTER — Other Ambulatory Visit: Payer: Self-pay

## 2019-04-01 ENCOUNTER — Ambulatory Visit: Payer: Medicaid Other | Admitting: Internal Medicine

## 2019-04-01 VITALS — BP 167/77 | HR 56 | Resp 16 | Ht 66.0 in | Wt 274.8 lb

## 2019-04-01 DIAGNOSIS — J45909 Unspecified asthma, uncomplicated: Secondary | ICD-10-CM | POA: Diagnosis not present

## 2019-04-01 DIAGNOSIS — H608X3 Other otitis externa, bilateral: Secondary | ICD-10-CM | POA: Diagnosis not present

## 2019-04-01 DIAGNOSIS — I10 Essential (primary) hypertension: Secondary | ICD-10-CM

## 2019-04-01 DIAGNOSIS — E039 Hypothyroidism, unspecified: Secondary | ICD-10-CM | POA: Diagnosis not present

## 2019-04-01 DIAGNOSIS — M25562 Pain in left knee: Secondary | ICD-10-CM

## 2019-04-01 DIAGNOSIS — E782 Mixed hyperlipidemia: Secondary | ICD-10-CM

## 2019-04-01 DIAGNOSIS — Z1211 Encounter for screening for malignant neoplasm of colon: Secondary | ICD-10-CM

## 2019-04-01 DIAGNOSIS — M25561 Pain in right knee: Secondary | ICD-10-CM

## 2019-04-01 DIAGNOSIS — G8929 Other chronic pain: Secondary | ICD-10-CM

## 2019-04-01 NOTE — Progress Notes (Signed)
[facility] [place], [place] [address]  Internal MEDICINE  Office Visit Note  Patient Name: Brianna Gray  [id number]  TC:[id number]  Date of Service: 04/03/2019  Chief Complaint  Patient presents with  . Hypertension  . Gastroesophageal Reflux  . Asthma   HPI Pt is here for routine follow up, she feels well, she is trying to work on her weight, BP ( systolic is elevated), she did see ortho and was scheduled for physical therapy. Asthma is under good control, no new complaints at this time except her allergies and itching in her ears   Current Medication: Outpatient Encounter Medications as of 04/01/2019  Medication Sig  . ADVAIR DISKUS 250-50 MCG/DOSE AEPB INHALE 1 PUFF INTO THE LUNGS 2 (TWO) TIMES DAILY AS NEEDED. FOR RESPIRATORY ISSUES.  Marland Kitchen atorvastatin (LIPITOR) 10 MG tablet TAKE 1 TABLET BY MOUTH AT BEDTIME  . esomeprazole (NEXIUM) 40 MG capsule TAKE 1 CAPSULE BY MOUTH EVERY DAY FOR STOMACH  . fluticasone (FLONASE) 50 MCG/ACT nasal spray Place 1-2 sprays into both nostrils daily as needed. For nasal congestion.  . furosemide (LASIX) 20 MG tablet Take 1 tablet (20 mg total) by mouth daily.  . hydrALAZINE (APRESOLINE) 10 MG tablet Take one tab po bid for BP  . ipratropium-albuterol (DUONEB) 0.5-2.5 (3) MG/3ML SOLN One vial tid prn for asthma, faillure to MDI  . levothyroxine (SYNTHROID) 88 MCG tablet TAKE 1 TABLET BY MOUTH EVERY MORNING ON EMPTY STOMACH**OK TO CHANGE TO LANNETT PER MD**  . loratadine (CLARITIN) 10 MG tablet TAKE 1 TABLET BY MOUTH EVERY DAY  . losartan (COZAAR) 100 MG tablet TAKE 1 TABLET BY MOUTH EVERY DAY  . meloxicam (MOBIC) 7.5 MG tablet TAKE 1 TABLET BY MOUTH TWICE A DAY FOR LOWER BACK AND JOINT PAINS  . montelukast (SINGULAIR) 10 MG tablet Take 1 tablet (10 mg total) by mouth at bedtime. For sinus congestion and allergies  . Potassium 99 MG TABS Take 99 mg by mouth daily at 12 noon.  Marland Kitchen Respiratory Therapy Supplies (NEBULIZER) DEVI Use as  directed  . vitamin B-12 (CYANOCOBALAMIN) 1000 MCG tablet Take 1,000 mcg by mouth daily at 12 noon.  Marland Kitchen acetic acid-hydrocortisone (VOSOL-HC) OTIC solution Place 3 drops into both ears 2 (two) times daily. As needed for itching   No facility-administered encounter medications on file as of 04/01/2019.    Surgical History: Past Surgical History:  Procedure Laterality Date  . CHOLECYSTECTOMY    . HERNIA REPAIR    . HERNIA REPAIR    . HYSTEROSCOPY WITH D & C N/A 03/19/2015   Procedure: DILATATION AND CURETTAGE /HYSTEROSCOPY and polypectomy;  Surgeon: Honor Loh Ward, MD;  Location: [facility];  Service: Gynecology;  Laterality: N/A;  . LAPAROSCOPIC HYSTERECTOMY N/A 06/08/2016   Procedure: HYSTERECTOMY TOTAL LAPAROSCOPIC;  Surgeon: Gae Dry, MD;  Location: [facility];  Service: Gynecology;  Laterality: N/A;  . LAPAROSCOPIC SALPINGO OOPHERECTOMY Bilateral 06/08/2016   Procedure: LAPAROSCOPIC SALPINGO OOPHORECTOMY;  Surgeon: Gae Dry, MD;  Location: [facility];  Service: Gynecology;  Laterality: Bilateral;  . LAPAROTOMY N/A 06/08/2016   Procedure: LAPAROTOMY;  Surgeon: Gae Dry, MD;  Location: [facility];  Service: Gynecology;  Laterality: N/A;    Medical History: Past Medical History:  Diagnosis Date  . Asthma   . Cardiomyopathy ([place])   . Carpal tunnel syndrome   . Carpal tunnel syndrome   . Dizziness   . Edema   . Elevated lipids   . GERD (gastroesophageal reflux disease)   .  Hypertension   . Lower extremity edema   . Seasonal allergies   . Sleep apnea   . Wheezing     Family History: Family History  Problem Relation Age of Onset  . Diabetes Brother   . Hypertension Brother   . Breast cancer Neg Hx     Social History   Socioeconomic History  . Marital status: Married    Spouse name: Not on file  . Number of children: Not on file  . Years of education: Not on file  . Highest education level: Not on file  Occupational History  . Not on file  Tobacco Use  .  Smoking status: Never Smoker  . Smokeless tobacco: Current User    Types: Snuff  Substance and Sexual Activity  . Alcohol use: No  . Drug use: No  . Sexual activity: Never  Other Topics Concern  . Not on file  Social History Narrative   ** Merged History Encounter **       Social Determinants of Health   Financial Resource Strain:   . Difficulty of Paying Living Expenses: Not on file  Food Insecurity:   . Worried About Charity fundraiser in the Last Year: Not on file  . Ran Out of Food in the Last Year: Not on file  Transportation Needs:   . Lack of Transportation (Medical): Not on file  . Lack of Transportation (Non-Medical): Not on file  Physical Activity:   . Days of Exercise per Week: Not on file  . Minutes of Exercise per Session: Not on file  Stress:   . Feeling of Stress : Not on file  Social Connections:   . Frequency of Communication with Friends and Family: Not on file  . Frequency of Social Gatherings with Friends and Family: Not on file  . Attends Religious Services: Not on file  . Active Member of Clubs or Organizations: Not on file  . Attends Archivist Meetings: Not on file  . Marital Status: Not on file  Intimate Partner Violence:   . Fear of Current or Ex-Partner: Not on file  . Emotionally Abused: Not on file  . Physically Abused: Not on file  . Sexually Abused: Not on file    Review of Systems  Constitutional: Negative for chills, diaphoresis and fatigue.  HENT: Positive for rhinorrhea. Negative for ear pain, postnasal drip and sinus pressure.        Ears itching  Eyes: Negative for photophobia, discharge, redness, itching and visual disturbance.  Respiratory: Negative for cough, shortness of breath and wheezing.   Cardiovascular: Negative for chest pain, palpitations and leg swelling.  Gastrointestinal: Negative for abdominal pain, constipation, diarrhea, nausea and vomiting.  Genitourinary: Negative for dysuria and flank pain.   Musculoskeletal: Positive for arthralgias. Negative for back pain, gait problem and neck pain.       Knee pain   Skin: Negative for color change.  Allergic/Immunologic: Negative for environmental allergies and food allergies.  Neurological: Negative.  Negative for dizziness and headaches.  Hematological: Negative.  Does not bruise/bleed easily.  Psychiatric/Behavioral: Negative.  Negative for agitation, behavioral problems (depression) and hallucinations.    Vital Signs: BP (!) 167/77   Pulse (!) 56   Resp 16   Ht 5\' 6"  (1.676 m)   Wt 274 lb 12.8 oz (124.6 kg)   SpO2 95%   BMI 44.35 kg/m    Physical Exam Constitutional:      General: She is not in acute distress.  Appearance: She is well-developed. She is not diaphoretic.  HENT:     Head: Normocephalic and atraumatic.     Right Ear: Tympanic membrane and ear canal normal. There is no impacted cerumen.     Left Ear: Tympanic membrane normal. There is no impacted cerumen.     Ears:     Comments: Dryness    Mouth/Throat:     Pharynx: No oropharyngeal exudate.  Eyes:     Pupils: Pupils are equal, round, and reactive to light.  Neck:     Thyroid: No thyromegaly.     Vascular: No JVD.     Trachea: No tracheal deviation.  Cardiovascular:     Rate and Rhythm: Normal rate and regular rhythm.     Heart sounds: Normal heart sounds. No murmur. No friction rub. No gallop.   Pulmonary:     Effort: Pulmonary effort is normal. No respiratory distress.     Breath sounds: No wheezing or rales.  Chest:     Chest wall: No tenderness.  Abdominal:     General: Bowel sounds are normal.     Palpations: Abdomen is soft.  Musculoskeletal:        General: Normal range of motion.     Cervical back: Normal range of motion and neck supple.  Lymphadenopathy:     Cervical: No cervical adenopathy.  Skin:    General: Skin is warm and dry.     Capillary Refill: varicosities bilateral lower ext  Neurological:     Mental Status: She is alert  and oriented to person, place, and time.     Cranial Nerves: No cranial nerve deficit.  Psychiatric:        Behavior: Behavior normal.        Thought Content: Thought content normal.        Judgment: Judgment normal.    Assessment/Plan: 1. Essential hypertension, benign - Pt is instructed to monitor blood pressure at home, DASH diet and intermittent, continue all meds as before  2. Chronic eczematous otitis externa of both ears - pt is instructed to use any topical oils - acetic acid-hydrocortisone (VOSOL-HC) OTIC solution; Place 3 drops into both ears 2 (two) times daily. As needed for itching  Dispense: 10 mL; Refill: 0  3. Hypothyroidism, unspecified type - Continue Synthroid   4. Moderate asthma, unspecified whether complicated, unspecified whether persistent - Controlled with meds   5. Chronic pain of both knees - per ortho  6. Mixed hyperlipidemia - Controlled  7. Morbid obesity (HCC) Obesity Counseling: Risk Assessment: An assessment of behavioral risk factors was made today and includes lack of exercise sedentary lifestyle, lack of portion control and poor dietary habits.  Risk Modification Advice: She was counseled on portion control guidelines. Restricting daily caloric intake to. . The detrimental long term effects of obesity on her health and ongoing poor compliance was also discussed with the patient.  General Counseling: Giovanni verbalizes understanding of the findings of todays visit and agrees with plan of treatment. I have discussed any further diagnostic evaluation that may be needed or ordered today. We also reviewed her medications today. she has been encouraged to call the office with any questions or concerns that should arise related to todays visit.   Meds ordered this encounter  Medications  . acetic acid-hydrocortisone (VOSOL-HC) OTIC solution    Sig: Place 3 drops into both ears 2 (two) times daily. As needed for itching    Dispense:  10 mL     Refill:  0    Total time spent: 30  Minutes Time spent includes review of chart, medications, test results, and follow up plan with the patient.      Dr Lavera Guise Internal medicine

## 2019-04-03 LAB — LIPID PANEL WITH LDL/HDL RATIO
Cholesterol, Total: 168 mg/dL (ref 100–199)
HDL: 53 mg/dL (ref >39)
LDL Chol Calc (NIH): 89 mg/dL (ref 0–99)
LDL/HDL Ratio: 1.7 ratio (ref 0.0–3.2)
Triglycerides: 148 mg/dL (ref 0–149)
VLDL Cholesterol Cal: 26 mg/dL (ref 5–40)

## 2019-04-03 LAB — CBC WITH DIFFERENTIAL/PLATELET
Basophils Absolute: 0 10*3/uL (ref 0.0–0.2)
Basos: 0 %
EOS (ABSOLUTE): 0.3 10*3/uL (ref 0.0–0.4)
Eos: 3 %
Hematocrit: 36.8 % (ref 34.0–46.6)
Hemoglobin: 11.8 g/dL (ref 11.1–15.9)
Immature Grans (Abs): 0 10*3/uL (ref 0.0–0.1)
Immature Granulocytes: 0 %
Lymphocytes Absolute: 2.1 10*3/uL (ref 0.7–3.1)
Lymphs: 22 %
MCH: 24.8 pg — ABNORMAL LOW (ref 26.6–33.0)
MCHC: 32.1 g/dL (ref 31.5–35.7)
MCV: 78 fL — ABNORMAL LOW (ref 79–97)
Monocytes Absolute: 0.6 10*3/uL (ref 0.1–0.9)
Monocytes: 6 %
Neutrophils Absolute: 6.5 10*3/uL (ref 1.4–7.0)
Neutrophils: 69 %
Platelets: 287 10*3/uL (ref 150–450)
RBC: 4.75 x10E6/uL (ref 3.77–5.28)
RDW: 14.6 % (ref 11.7–15.4)
WBC: 9.5 10*3/uL (ref 3.4–10.8)

## 2019-04-03 LAB — T4, FREE: Free T4: 1.05 ng/dL (ref 0.82–1.77)

## 2019-04-03 LAB — TSH: TSH: 3.1 u[IU]/mL (ref 0.450–4.500)

## 2019-04-03 MED ORDER — HYDROCORTISONE-ACETIC ACID 1-2 % OT SOLN: 3.0000 [drp] | Freq: Two times a day (BID) | OTIC | 0 refills | Status: DC

## 2019-04-03 NOTE — Progress Notes (Signed)
Labs look normal, can u check if she had a colonoscopy in her [facility] records

## 2019-04-08 ENCOUNTER — Other Ambulatory Visit: Payer: Self-pay

## 2019-04-08 ENCOUNTER — Telehealth: Payer: Self-pay

## 2019-04-08 MED ORDER — HYDRALAZINE HCL 25 MG PO TABS: 25.0000 mg | ORAL_TABLET | Freq: Three times a day (TID) | ORAL | 1 refills | Status: DC

## 2019-04-08 NOTE — Telephone Encounter (Signed)
PT SON CALLED STATING THAT PT BP WAS 191/100 THIS MORNING. SPOKE WITH DR Humphrey Rolls AND CHANGED HER HYDRALAZINE FROM 10 MG BID TO 25 MG BID. ALSO ADVISED PT SON TO ADVISE PT TO TAKE BP READING TID.

## 2019-04-20 ENCOUNTER — Other Ambulatory Visit: Payer: Self-pay | Admitting: Internal Medicine

## 2019-04-25 NOTE — Progress Notes (Signed)
Pt needs to be scheduled for colonoscopy

## 2019-04-26 NOTE — Progress Notes (Signed)
Please check, order has been done

## 2019-05-07 ENCOUNTER — Telehealth: Payer: Self-pay

## 2019-05-07 ENCOUNTER — Other Ambulatory Visit: Payer: Self-pay

## 2019-05-07 DIAGNOSIS — Z1211 Encounter for screening for malignant neoplasm of colon: Secondary | ICD-10-CM

## 2019-05-07 NOTE — Telephone Encounter (Signed)
Gastroenterology Pre-Procedure Review  Request Date: Monday 06/09/19 Requesting Physician: Dr. Marius Ditch  PATIENT REVIEW QUESTIONS: The patient responded to the following health history questions as indicated:    1. Are you having any GI issues? no 2. Do you have a personal history of Polyps? no 3. Do you have a family history of Colon Cancer or Polyps? no 4. Diabetes Mellitus? no 5. Joint replacements in the past 12 months?no 6. Major health problems in the past 3 months?no 7. Any artificial heart valves, MVP, or defibrillator?no    MEDICATIONS & ALLERGIES:    Patient reports the following regarding taking any anticoagulation/antiplatelet therapy:   Plavix, Coumadin, Eliquis, Xarelto, Lovenox, Pradaxa, Brilinta, or Effient? no Aspirin? no  Patient confirms/reports the following medications:  Current Outpatient Medications  Medication Sig Dispense Refill  . acetic acid-hydrocortisone (VOSOL-HC) OTIC solution Place 3 drops into both ears 2 (two) times daily. As needed for itching 10 mL 0  . ADVAIR DISKUS 250-50 MCG/DOSE AEPB INHALE 1 PUFF INTO THE LUNGS 2 (TWO) TIMES DAILY AS NEEDED. FOR RESPIRATORY ISSUES. 60 each 12  . atorvastatin (LIPITOR) 10 MG tablet TAKE 1 TABLET BY MOUTH EVERYDAY AT BEDTIME 30 tablet 5  . esomeprazole (NEXIUM) 40 MG capsule TAKE 1 CAPSULE BY MOUTH EVERY DAY FOR STOMACH 30 capsule 3  . fluticasone (FLONASE) 50 MCG/ACT nasal spray Place 1-2 sprays into both nostrils daily as needed. For nasal congestion. 1 g 2  . furosemide (LASIX) 20 MG tablet Take 1 tablet (20 mg total) by mouth daily. 90 tablet 4  . hydrALAZINE (APRESOLINE) 25 MG tablet Take 1 tablet (25 mg total) by mouth 3 (three) times daily. 90 tablet 1  . ipratropium-albuterol (DUONEB) 0.5-2.5 (3) MG/3ML SOLN One vial tid prn for asthma, faillure to MDI 120 mL 1  . levothyroxine (SYNTHROID) 88 MCG tablet TAKE 1 TABLET BY MOUTH EVERY MORNING ON EMPTY STOMACH**OK TO CHANGE TO LANNETT PER MD** 90 tablet 4  .  loratadine (CLARITIN) 10 MG tablet TAKE 1 TABLET BY MOUTH EVERY DAY 90 tablet 2  . losartan (COZAAR) 100 MG tablet TAKE 1 TABLET BY MOUTH EVERY DAY 90 tablet 3  . meloxicam (MOBIC) 7.5 MG tablet TAKE 1 TABLET BY MOUTH TWICE A DAY FOR LOWER BACK AND JOINT PAINS 60 tablet 3  . montelukast (SINGULAIR) 10 MG tablet Take 1 tablet (10 mg total) by mouth at bedtime. For sinus congestion and allergies 90 tablet 3  . Potassium 99 MG TABS Take 99 mg by mouth daily at 12 noon.    Marland Kitchen Respiratory Therapy Supplies (NEBULIZER) DEVI Use as directed 1 each 0  . vitamin B-12 (CYANOCOBALAMIN) 1000 MCG tablet Take 1,000 mcg by mouth daily at 12 noon.     No current facility-administered medications for this visit.    Patient confirms/reports the following allergies:  No Known Allergies  No orders of the defined types were placed in this encounter.   AUTHORIZATION INFORMATION Primary Insurance: 1D#: Group #:  Secondary Insurance: 1D#: Group #:  SCHEDULE INFORMATION: Date: Monday 04/12/21Time: Location:Dr.Vanga

## 2019-06-05 ENCOUNTER — Other Ambulatory Visit: Admission: RE | Admit: 2019-06-05 | Payer: Medicaid Other | Source: Ambulatory Visit

## 2019-06-09 ENCOUNTER — Ambulatory Visit: Admission: RE | Admit: 2019-06-09 | Payer: Medicaid Other | Source: Home / Self Care | Admitting: Gastroenterology

## 2019-06-09 ENCOUNTER — Encounter: Admission: RE | Payer: Self-pay | Source: Home / Self Care

## 2019-06-09 SURGERY — COLONOSCOPY WITH PROPOFOL
Anesthesia: General

## 2019-06-27 ENCOUNTER — Telehealth: Payer: Self-pay

## 2019-06-27 NOTE — Telephone Encounter (Signed)
Confirmed and screened for 07-01-19 ov. 

## 2019-07-01 ENCOUNTER — Ambulatory Visit: Payer: Medicaid Other | Admitting: Internal Medicine

## 2019-07-04 ENCOUNTER — Other Ambulatory Visit: Payer: Self-pay | Admitting: Internal Medicine

## 2019-07-08 ENCOUNTER — Ambulatory Visit: Payer: Medicaid Other | Admitting: Internal Medicine

## 2019-07-25 ENCOUNTER — Telehealth: Payer: Self-pay

## 2019-07-25 NOTE — Telephone Encounter (Signed)
Lmom to confirm and screen for 07-29-19 ov.

## 2019-07-29 ENCOUNTER — Encounter: Payer: Self-pay | Admitting: Internal Medicine

## 2019-07-29 ENCOUNTER — Other Ambulatory Visit: Payer: Self-pay

## 2019-07-29 ENCOUNTER — Ambulatory Visit: Payer: Medicaid Other | Admitting: Internal Medicine

## 2019-07-29 DIAGNOSIS — R0602 Shortness of breath: Secondary | ICD-10-CM | POA: Diagnosis not present

## 2019-07-29 DIAGNOSIS — I10 Essential (primary) hypertension: Secondary | ICD-10-CM | POA: Diagnosis not present

## 2019-07-29 DIAGNOSIS — G479 Sleep disorder, unspecified: Secondary | ICD-10-CM

## 2019-07-29 MED ORDER — FUROSEMIDE 40 MG PO TABS: 40.0000 mg | ORAL_TABLET | Freq: Every day | ORAL | 3 refills | Status: DC

## 2019-07-29 NOTE — Progress Notes (Signed)
[facility] [place], [place] [address]  Internal MEDICINE  Office Visit Note  Patient Name: Brianna Gray  [id number]  TC:[id number]  Date of Service: 08/05/2019  Chief Complaint  Patient presents with  . Follow-up    short of breath,left foot swollen  . Hypertension    HPI Pt is here with her grand-daughter C/o worsening sob, feet and leg swelling. She is c/o pain as well She has gained more weight, has problem sleeping at night, BP is elevated,admits not taking her meds this morning, she is not very careful with her diet either. No fever or chills, she does not sleep well at night, has difficulty maintaining her sleep. Asthma is under good control.  Current Medication: Outpatient Encounter Medications as of 07/29/2019  Medication Sig  . acetic acid-hydrocortisone (VOSOL-HC) OTIC solution Place 3 drops into both ears 2 (two) times daily. As needed for itching  . ADVAIR DISKUS 250-50 MCG/DOSE AEPB INHALE 1 PUFF INTO THE LUNGS 2 (TWO) TIMES DAILY AS NEEDED. FOR RESPIRATORY ISSUES.  Marland Kitchen atorvastatin (LIPITOR) 10 MG tablet TAKE 1 TABLET BY MOUTH EVERYDAY AT BEDTIME  . fluticasone (FLONASE) 50 MCG/ACT nasal spray Place 1-2 sprays into both nostrils daily as needed. For nasal congestion.  . hydrALAZINE (APRESOLINE) 25 MG tablet TAKE 1 TABLET BY MOUTH THREE TIMES A DAY  . ipratropium-albuterol (DUONEB) 0.5-2.5 (3) MG/3ML SOLN One vial tid prn for asthma, faillure to MDI  . levothyroxine (SYNTHROID) 88 MCG tablet TAKE 1 TABLET BY MOUTH EVERY MORNING ON EMPTY STOMACH**OK TO CHANGE TO LANNETT PER MD**  . loratadine (CLARITIN) 10 MG tablet TAKE 1 TABLET BY MOUTH EVERY DAY  . losartan (COZAAR) 100 MG tablet TAKE 1 TABLET BY MOUTH EVERY DAY  . meloxicam (MOBIC) 7.5 MG tablet TAKE 1 TABLET BY MOUTH TWICE A DAY FOR LOWER BACK AND JOINT PAINS  . montelukast (SINGULAIR) 10 MG tablet Take 1 tablet (10 mg total) by mouth at bedtime. For sinus congestion and allergies  . Potassium  99 MG TABS Take 99 mg by mouth daily at 12 noon.  . vitamin B-12 (CYANOCOBALAMIN) 1000 MCG tablet Take 1,000 mcg by mouth daily at 12 noon.  . [DISCONTINUED] esomeprazole (NEXIUM) 40 MG capsule TAKE 1 CAPSULE BY MOUTH EVERY DAY FOR STOMACH  . [DISCONTINUED] furosemide (LASIX) 20 MG tablet Take 1 tablet (20 mg total) by mouth daily.  . [DISCONTINUED] Respiratory Therapy Supplies (NEBULIZER) DEVI Use as directed  . furosemide (LASIX) 40 MG tablet Take 1 tablet (40 mg total) by mouth daily.  . [DISCONTINUED] furosemide (LASIX) 40 MG tablet Take 1 tablet (40 mg total) by mouth daily.   No facility-administered encounter medications on file as of 07/29/2019.    Surgical History: Past Surgical History:  Procedure Laterality Date  . CHOLECYSTECTOMY    . HERNIA REPAIR    . HERNIA REPAIR    . HYSTEROSCOPY WITH D & C N/A 03/19/2015   Procedure: DILATATION AND CURETTAGE /HYSTEROSCOPY and polypectomy;  Surgeon: Honor Loh Ward, MD;  Location: [facility];  Service: Gynecology;  Laterality: N/A;  . LAPAROSCOPIC HYSTERECTOMY N/A 06/08/2016   Procedure: HYSTERECTOMY TOTAL LAPAROSCOPIC;  Surgeon: Gae Dry, MD;  Location: [facility];  Service: Gynecology;  Laterality: N/A;  . LAPAROSCOPIC SALPINGO OOPHERECTOMY Bilateral 06/08/2016   Procedure: LAPAROSCOPIC SALPINGO OOPHORECTOMY;  Surgeon: Gae Dry, MD;  Location: [facility];  Service: Gynecology;  Laterality: Bilateral;  . LAPAROTOMY N/A 06/08/2016   Procedure: LAPAROTOMY;  Surgeon: Gae Dry, MD;  Location:  [facility];  Service: Gynecology;  Laterality: N/A;    Medical History: Past Medical History:  Diagnosis Date  . Asthma   . Cardiomyopathy ([place])   . Carpal tunnel syndrome   . Carpal tunnel syndrome   . Dizziness   . Edema   . Elevated lipids   . GERD (gastroesophageal reflux disease)   . Hypertension   . Lower extremity edema   . Seasonal allergies   . Sleep apnea   . Wheezing     Family History: Family History  Problem  Relation Age of Onset  . Diabetes Brother   . Hypertension Brother   . Breast cancer Neg Hx     Social History   Socioeconomic History  . Marital status: Married    Spouse name: Not on file  . Number of children: Not on file  . Years of education: Not on file  . Highest education level: Not on file  Occupational History  . Not on file  Tobacco Use  . Smoking status: Never Smoker  . Smokeless tobacco: Current User    Types: Snuff  Substance and Sexual Activity  . Alcohol use: No  . Drug use: No  . Sexual activity: Never  Other Topics Concern  . Not on file  Social History Narrative   ** Merged History Encounter **       Social Determinants of Health   Financial Resource Strain:   . Difficulty of Paying Living Expenses:   Food Insecurity:   . Worried About Charity fundraiser in the Last Year:   . Arboriculturist in the Last Year:   Transportation Needs:   . Film/video editor (Medical):   Marland Kitchen Lack of Transportation (Non-Medical):   Physical Activity:   . Days of Exercise per Week:   . Minutes of Exercise per Session:   Stress:   . Feeling of Stress :   Social Connections:   . Frequency of Communication with Friends and Family:   . Frequency of Social Gatherings with Friends and Family:   . Attends Religious Services:   . Active Member of Clubs or Organizations:   . Attends Archivist Meetings:   Marland Kitchen Marital Status:   Intimate Partner Violence:   . Fear of Current or Ex-Partner:   . Emotionally Abused:   Marland Kitchen Physically Abused:   . Sexually Abused:     Review of Systems  Constitutional: Negative for chills, diaphoresis and fatigue.  HENT: Negative for ear pain, postnasal drip and sinus pressure.   Eyes: Negative for photophobia, discharge, redness, itching and visual disturbance.  Respiratory: Positive for shortness of breath. Negative for cough and wheezing.   Cardiovascular: Positive for leg swelling. Negative for chest pain and palpitations.   Gastrointestinal: Negative for abdominal pain, constipation, diarrhea, nausea and vomiting.  Genitourinary: Negative for dysuria and flank pain.  Musculoskeletal: Negative for arthralgias, back pain, gait problem and neck pain.  Skin: Positive for rash. Negative for color change.  Allergic/Immunologic: Negative for environmental allergies and food allergies.  Neurological: Negative for dizziness and headaches.  Hematological: Does not bruise/bleed easily.  Psychiatric/Behavioral: Negative for agitation, behavioral problems (depression) and hallucinations.   Vital Signs: BP (!) 165/85 Comment: repeat bp ( 184/91) first  Pulse 63   Temp (!) 97.3 F (36.3 C)   Resp 16   Ht 5\' 6"  (1.676 m)   Wt 278 lb (126.1 kg)   SpO2 94%   BMI 44.87 kg/m    Physical  Exam Constitutional:      General: She is not in acute distress.    Appearance: She is well-developed. She is not diaphoretic.  HENT:     Head: Normocephalic and atraumatic.     Mouth/Throat:     Pharynx: No oropharyngeal exudate.  Eyes:     Pupils: Pupils are equal, round, and reactive to light.  Neck:     Thyroid: No thyromegaly.     Vascular: No JVD.     Trachea: No tracheal deviation.  Cardiovascular:     Rate and Rhythm: Normal rate and regular rhythm.     Heart sounds: Normal heart sounds. No murmur. No friction rub. No gallop.   Pulmonary:     Effort: Pulmonary effort is normal. No respiratory distress.     Breath sounds: No wheezing or rales.     Comments: Reduced air entry Chest:     Chest wall: No tenderness.  Abdominal:     General: Bowel sounds are normal.     Palpations: Abdomen is soft.  Musculoskeletal:        General: Tenderness present. Normal range of motion.     Cervical back: Normal range of motion and neck supple.     Right lower leg: Edema present.     Left lower leg: Edema present.  Lymphadenopathy:     Cervical: No cervical adenopathy.  Skin:    General: Skin is warm and dry.     Findings:  Erythema present.  Neurological:     Mental Status: She is alert and oriented to person, place, and time.     Cranial Nerves: No cranial nerve deficit.  Psychiatric:        Behavior: Behavior normal.        Thought Content: Thought content normal.        Judgment: Judgment normal.    Assessment/Plan: 1. Shortness of breath - Multifactorial, deconditioning, volume overload, elevated bp, increase Lasix 40 mg po qd, pt was instructed to go to ED if symptoms get worse - ECHOCARDIOGRAM COMPLETE; Future, will need to see cardiology   2. 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 Baseline sleep study is ordered to further look into this. Long term complications of OSA was addressed with the patient. - PSG Sleep Study; Future  3. Essential hypertension, malignant - Pt is instructed to take al meds as prescribed, increased Lasix. Go to ED if bp is not under control  - Basic metabolic panel  4. Morbid obesity (HCC) Obesity Counseling: Risk Assessment: An assessment of behavioral risk factors was made today and includes lack of exercise sedentary lifestyle, lack of portion control and poor dietary habits.  Risk Modification Advice: She was counseled on portion control guidelines. Restricting daily caloric intake to. . The detrimental long term effects of obesity on her health and ongoing poor compliance was also discussed with the patient.  General Counseling: Skylor verbalizes understanding of the findings of todays visit and agrees with plan of treatment. I have discussed any further diagnostic evaluation that may be needed or ordered today. We also reviewed her medications today. she has been encouraged to call the office with any questions or concerns that should arise related to todays visit.  Orders Placed This Encounter   Procedures  . Basic metabolic panel  . ECHOCARDIOGRAM COMPLETE  . PSG Sleep Study  Meds ordered this encounter  Medications  . DISCONTD: furosemide (LASIX) 40 MG tablet    Sig: Take 1 tablet (40 mg total) by mouth daily.    Dispense:  30 tablet    Refill:  3  . furosemide (LASIX) 40 MG tablet    Sig: Take 1 tablet (40 mg total) by mouth daily.    Dispense:  90 tablet    Refill:  3    Total time spent: 45 Minutes Time spent includes review of chart, medications, test results, and follow up plan with the patient.  Pt has complex medical problem, involved highest level of critical thinking and decision making  Dr Lavera Guise Internal medicine

## 2019-07-30 ENCOUNTER — Other Ambulatory Visit: Payer: Self-pay

## 2019-07-30 ENCOUNTER — Other Ambulatory Visit: Payer: Self-pay | Admitting: Internal Medicine

## 2019-07-30 ENCOUNTER — Emergency Department: Payer: Medicaid Other

## 2019-07-30 ENCOUNTER — Emergency Department
Admission: EM | Admit: 2019-07-30 | Discharge: 2019-07-30 | Disposition: A | Discharge status: Home / Self Care | Payer: Medicaid Other | Attending: Student in an Organized Health Care Education/Training Program | Admitting: Student in an Organized Health Care Education/Training Program

## 2019-07-30 ENCOUNTER — Encounter: Payer: Self-pay | Admitting: Emergency Medicine

## 2019-07-30 DIAGNOSIS — J45909 Unspecified asthma, uncomplicated: Secondary | ICD-10-CM | POA: Diagnosis not present

## 2019-07-30 DIAGNOSIS — R600 Localized edema: Secondary | ICD-10-CM | POA: Insufficient documentation

## 2019-07-30 DIAGNOSIS — I10 Essential (primary) hypertension: Secondary | ICD-10-CM | POA: Insufficient documentation

## 2019-07-30 DIAGNOSIS — R0602 Shortness of breath: Secondary | ICD-10-CM | POA: Diagnosis not present

## 2019-07-30 DIAGNOSIS — Z79899 Other long term (current) drug therapy: Secondary | ICD-10-CM | POA: Diagnosis not present

## 2019-07-30 LAB — CBC
HCT: 37.5 % (ref 36.0–46.0)
Hemoglobin: 11.8 g/dL — ABNORMAL LOW (ref 12.0–15.0)
MCH: 24.8 pg — ABNORMAL LOW (ref 26.0–34.0)
MCHC: 31.5 g/dL (ref 30.0–36.0)
MCV: 78.9 fL — ABNORMAL LOW (ref 80.0–100.0)
Platelets: 258 10*3/uL (ref 150–400)
RBC: 4.75 MIL/uL (ref 3.87–5.11)
RDW: 15.9 % — ABNORMAL HIGH (ref 11.5–15.5)
WBC: 9 10*3/uL (ref 4.0–10.5)
nRBC: 0 % (ref 0.0–0.2)

## 2019-07-30 LAB — BASIC METABOLIC PANEL
Anion gap: 11 (ref 5–15)
BUN: 17 mg/dL (ref 8–23)
CO2: 29 mmol/L (ref 22–32)
Calcium: 9.7 mg/dL (ref 8.9–10.3)
Chloride: 100 mmol/L (ref 98–111)
Creatinine, Ser: 0.89 mg/dL (ref 0.44–1.00)
GFR calc Af Amer: >60 mL/min (ref >60)
GFR calc non Af Amer: >60 mL/min (ref >60)
Glucose, Bld: 110 mg/dL — ABNORMAL HIGH (ref 70–99)
Potassium: 3.8 mmol/L (ref 3.5–5.1)
Sodium: 140 mmol/L (ref 135–145)

## 2019-07-30 LAB — TROPONIN I (HIGH SENSITIVITY): Troponin I (High Sensitivity): 10 ng/L (ref <18)

## 2019-07-30 MED ORDER — SODIUM CHLORIDE 0.9% FLUSH: 3.0000 mL | Freq: Once | INTRAVENOUS | Status: DC

## 2019-07-30 MED ORDER — FUROSEMIDE 10 MG/ML IJ SOLN
40.0000 mg | Freq: Once | INTRAMUSCULAR | Status: AC
  Administered 2019-07-30: 40 mg via INTRAVENOUS
  Filled 2019-07-30: qty 4

## 2019-07-30 MED ORDER — IPRATROPIUM-ALBUTEROL 0.5-2.5 (3) MG/3ML IN SOLN
3.0000 mL | Freq: Once | RESPIRATORY_TRACT | Status: AC
  Administered 2019-07-30: 3 mL via RESPIRATORY_TRACT
  Filled 2019-07-30: qty 3

## 2019-07-30 MED ORDER — HYDRALAZINE HCL 50 MG PO TABS
25.0000 mg | ORAL_TABLET | Freq: Once | ORAL | Status: AC
  Administered 2019-07-30: 25 mg via ORAL
  Filled 2019-07-30: qty 1

## 2019-07-30 NOTE — Discharge Instructions (Addendum)
Please increase you Lasix to 40 MG.  Follow up with PCP or Dr. Saralyn Pilar in the next few days for recheck.  Return to ER if you develop shortness of breath, chest pain or for any additional questions or concerns.

## 2019-07-30 NOTE — ED Provider Notes (Signed)
[facility] Emergency Department Provider Note    First MD Initiated Contact with Patient 07/30/19 1548     (approximate)  I have reviewed the triage vital signs and the nursing notes.   HISTORY  Chief Complaint Hypertension    HPI Brianna Gray is a 69 y.o. female bolus past medical history presents to the ER for evaluation of shortness of breath elevated blood pressure seen in clinic yesterday and told to return to the ER for blood pressure were persistently elevated.  States that she has had some lower extremity swelling.  Was told to increase her Lasix yesterday.  Did this once but does not feel like she is getting better.  Does feel some wheezing.  Denies any fevers.  No chest pain or pressure.  Does have outpatient scheduled for evaluation of sleep apnea.    Past Medical History:  Diagnosis Date  . Asthma   . Cardiomyopathy ([place])   . Carpal tunnel syndrome   . Carpal tunnel syndrome   . Dizziness   . Edema   . Elevated lipids   . GERD (gastroesophageal reflux disease)   . Hypertension   . Lower extremity edema   . Seasonal allergies   . Sleep apnea   . Wheezing    Family History  Problem Relation Age of Onset  . Diabetes Brother   . Hypertension Brother   . Breast cancer Neg Hx    Past Surgical History:  Procedure Laterality Date  . CHOLECYSTECTOMY    . HERNIA REPAIR    . HERNIA REPAIR    . HYSTEROSCOPY WITH D & C N/A 03/19/2015   Procedure: DILATATION AND CURETTAGE /HYSTEROSCOPY and polypectomy;  Surgeon: Honor Loh Ward, MD;  Location: [facility] ORS;  Service: Gynecology;  Laterality: N/A;  . LAPAROSCOPIC HYSTERECTOMY N/A 06/08/2016   Procedure: HYSTERECTOMY TOTAL LAPAROSCOPIC;  Surgeon: Gae Dry, MD;  Location: [facility] ORS;  Service: Gynecology;  Laterality: N/A;  . LAPAROSCOPIC SALPINGO OOPHERECTOMY Bilateral 06/08/2016   Procedure: LAPAROSCOPIC SALPINGO OOPHORECTOMY;  Surgeon: Gae Dry, MD;  Location: [facility] ORS;  Service:  Gynecology;  Laterality: Bilateral;  . LAPAROTOMY N/A 06/08/2016   Procedure: LAPAROTOMY;  Surgeon: Gae Dry, MD;  Location: [facility] ORS;  Service: Gynecology;  Laterality: N/A;   Patient Active Problem List   Diagnosis Date Noted  . Morbid obesity ([place]) 08/21/2016  . Primary osteoarthritis of right knee 07/01/2014      Prior to Admission medications   Medication Sig Start Date End Date Taking? Authorizing Provider  ADVAIR DISKUS 250-50 MCG/DOSE AEPB INHALE 1 PUFF INTO THE LUNGS 2 (TWO) TIMES DAILY AS NEEDED. FOR RESPIRATORY ISSUES. 01/27/19  Yes Lavera Guise, MD  atorvastatin (LIPITOR) 10 MG tablet TAKE 1 TABLET BY MOUTH EVERYDAY AT BEDTIME 04/21/19  Yes [place], Audie Clear, NP  esomeprazole (NEXIUM) 40 MG capsule TAKE 1 CAPSULE BY MOUTH EVERY DAY FOR STOMACH 07/30/19  Yes Lavera Guise, MD  fluticasone [facility]) 50 MCG/ACT nasal spray Place 1-2 sprays into both nostrils daily as needed. For nasal congestion. 12/18/17  Yes Lavera Guise, MD  hydrALAZINE (APRESOLINE) 25 MG tablet TAKE 1 TABLET BY MOUTH THREE TIMES A DAY 07/05/19  Yes Lavera Guise, MD  levothyroxine (SYNTHROID) 88 MCG tablet TAKE 1 TABLET BY MOUTH EVERY MORNING ON EMPTY STOMACH**OK TO CHANGE TO Montel Culver PER MD** 02/20/19  Yes Lavera Guise, MD  loratadine (CLARITIN) 10 MG tablet TAKE 1 TABLET BY MOUTH EVERY DAY 01/16/19  Yes Clayborn Bigness  M, MD  losartan (COZAAR) 100 MG tablet TAKE 1 TABLET BY MOUTH EVERY DAY 01/16/19  Yes Lavera Guise, MD  meloxicam (MOBIC) 7.5 MG tablet TAKE 1 TABLET BY MOUTH TWICE A DAY FOR LOWER BACK AND JOINT PAINS 03/24/19  Yes Lavera Guise, MD  acetic acid-hydrocortisone (VOSOL-HC) OTIC solution Place 3 drops into both ears 2 (two) times daily. As needed for itching 04/03/19   Lavera Guise, MD  ipratropium-albuterol (DUONEB) 0.5-2.5 (3) MG/3ML SOLN One vial tid prn for asthma, faillure to MDI 02/24/19   Lavera Guise, MD  montelukast (SINGULAIR) 10 MG tablet Take 1 tablet (10 mg total) by mouth at bedtime.  For sinus congestion and allergies 02/24/19   Lavera Guise, MD  Potassium 99 MG TABS Take 99 mg by mouth daily at 12 noon.    [provider]  vitamin B-12 (CYANOCOBALAMIN) 1000 MCG tablet Take 1,000 mcg by mouth daily at 12 noon.    [provider]    Allergies Patient has no known allergies.    Social History Social History   Tobacco Use  . Smoking status: Never Smoker  . Smokeless tobacco: Current User    Types: Snuff  Substance Use Topics  . Alcohol use: No  . Drug use: No    Review of Systems Patient denies headaches, rhinorrhea, blurry vision, numbness, shortness of breath, chest pain, edema, cough, abdominal pain, nausea, vomiting, diarrhea, dysuria, fevers, rashes or hallucinations unless otherwise stated above in HPI. ____________________________________________   PHYSICAL EXAM:  VITAL SIGNS: Vitals:   07/30/19 1343 07/30/19 1643  BP: (!) 170/68 (!) 151/61  Pulse: 63 65  Resp: 16   Temp: 98.4 F (36.9 C)   SpO2: 94%     Constitutional: Alert and oriented.  Eyes: Conjunctivae are normal.  Head: Atraumatic. Nose: No congestion/rhinnorhea. Mouth/Throat: Mucous membranes are moist.   Neck: No stridor. Painless ROM.  Cardiovascular: Normal rate, regular rhythm. Grossly normal heart sounds.  Good peripheral circulation. Respiratory: Normal respiratory effort.  No retractions. Lungs with scattered expiratory wheeze Gastrointestinal: Soft and nontender. No distention. No abdominal bruits. No CVA tenderness. Genitourinary:  Musculoskeletal: No lower extremity tenderness, trace BLE edema.  No joint effusions. Neurologic:  Normal speech and language. No gross focal neurologic deficits are appreciated. No facial droop Skin:  Skin is warm, dry and intact. No rash noted. Psychiatric: Mood and affect are normal. Speech and behavior are normal.  ____________________________________________   LABS (all labs ordered are listed, but only abnormal  results are displayed)  Results for orders placed or performed during the hospital encounter of 07/30/19 (from the past 24 hour(s))  Basic metabolic panel     Status: Abnormal   Collection Time: 07/30/19  1:53 PM  Result Value Ref Range   Sodium 140 135 - 145 mmol/L   Potassium 3.8 3.5 - 5.1 mmol/L   Chloride 100 98 - 111 mmol/L   CO2 29 22 - 32 mmol/L   Glucose, Bld 110 (H) 70 - 99 mg/dL   BUN 17 8 - 23 mg/dL   Creatinine, Ser 0.89 0.44 - 1.00 mg/dL   Calcium 9.7 8.9 - 10.3 mg/dL   GFR calc non Af Amer >60 >60 mL/min   GFR calc Af Amer >60 >60 mL/min   Anion gap 11 5 - 15  CBC     Status: Abnormal   Collection Time: 07/30/19  1:53 PM  Result Value Ref Range   WBC 9.0 4.0 - 10.5 K/uL  RBC 4.75 3.87 - 5.11 MIL/uL   Hemoglobin 11.8 (L) 12.0 - 15.0 g/dL   HCT 37.5 36.0 - 46.0 %   MCV 78.9 (L) 80.0 - 100.0 fL   MCH 24.8 (L) 26.0 - 34.0 pg   MCHC 31.5 30.0 - 36.0 g/dL   RDW 15.9 (H) 11.5 - 15.5 %   Platelets 258 150 - 400 K/uL   nRBC 0.0 0.0 - 0.2 %   ____________________________________________  EKG My review and personal interpretation at Time: 13:48   Indication: htn  Rate: 65  Rhythm: sinus Axis: normal Other: normal intervals, no stemi ____________________________________________  RADIOLOGY  I personally reviewed all radiographic images ordered to evaluate for the above acute complaints and reviewed radiology reports and findings.  These findings were personally discussed with the patient.  Please see medical record for radiology report.  ____________________________________________   PROCEDURES  Procedure(s) performed:  Procedures    Critical Care performed: no ____________________________________________   INITIAL IMPRESSION / ASSESSMENT AND PLAN / ED COURSE  Pertinent labs & imaging results that were available during my care of the patient were reviewed by me and considered in my medical decision making (see chart for details).   DDX: htn, chf,  electrolyte abn, medication noncompliance  Laporscha Loeffel is a 69 y.o. who presents to the ED with symptoms as described above.  Patient very pleasant nontoxic-appearing.  Does have some wheezing on exam but does not feel overtly short of breath at this time.  Do suspect a component of volume overload and patient is only had 1 dose of increasing her Lasix.  Given her hypertension we will give her a dose of IV Lasix and observe in the ER.  She not complaining any chest pain.  I will lower suspicion for ACS.  Doubt infectious process.  Does not seem consistent with COPD exacerbation.  Clinical Course as of Jul 30 2010  Wed Jul 30, 2019  1953 Patient reassessed.  Feels significant improvement after diuresis.  No hypoxia.  She denying any chest pain or cardiac enzyme negative.  She has follow-up with cardiology as well as PCP and pulmonology.  Will have patient continue plan for increasing diuresis for the next 2 to 3 days with close outpatient follow-up.  We discussed signs symptoms which she should return to the ER.   [PR]    Clinical Course User Index [PR] Merlyn Lot, MD    The patient was evaluated in Emergency Department today for the symptoms described in the history of present illness. He/she was evaluated in the context of the global COVID-19 pandemic, which necessitated consideration that the patient might be at risk for infection with the SARS-CoV-2 virus that causes COVID-19. Institutional protocols and algorithms that pertain to the evaluation of patients at risk for COVID-19 are in a state of rapid change based on information released by regulatory bodies including the CDC and federal and state organizations. These policies and algorithms were followed during the patient's care in the ED.  As part of my medical decision making, I reviewed the following data within the [place] notes reviewed and incorporated, Labs reviewed, notes from prior ED visits and [place]  Controlled Substance Database   ____________________________________________   FINAL CLINICAL IMPRESSION(S) / ED DIAGNOSES  Final diagnoses:  Hypertension, unspecified type  Shortness of breath      NEW MEDICATIONS STARTED DURING THIS VISIT:  New Prescriptions   No medications on file     Note:  This document was  prepared using Systems analyst and may include unintentional dictation errors.    Merlyn Lot, MD 07/30/19 2012

## 2019-07-30 NOTE — ED Triage Notes (Signed)
Arrives stating that BP is high since yesterday, SBP > 200.  Patient has history of HTN and takes BP medications.  States no change in medication. Was seen by PCP yesterday for c/o swelling in feet.  BP was elevated in PCP office and patient was told to continue to watch BP and if it remained elevated to go to the ED for evaluation.  Patient also c/o SOB x 1 day.

## 2019-08-05 ENCOUNTER — Encounter: Payer: Self-pay | Admitting: Internal Medicine

## 2019-08-05 MED ORDER — FUROSEMIDE 40 MG PO TABS
40.0000 mg | ORAL_TABLET | Freq: Every day | ORAL | 3 refills | Status: DC
Start: 2019-08-05 — End: 2019-12-30

## 2019-08-11 ENCOUNTER — Other Ambulatory Visit: Payer: Self-pay | Admitting: Internal Medicine

## 2019-08-13 ENCOUNTER — Ambulatory Visit (INDEPENDENT_AMBULATORY_CARE_PROVIDER_SITE_OTHER): Payer: Medicaid Other | Admitting: Internal Medicine

## 2019-08-13 DIAGNOSIS — G4733 Obstructive sleep apnea (adult) (pediatric): Secondary | ICD-10-CM

## 2019-08-21 ENCOUNTER — Telehealth: Payer: Self-pay

## 2019-08-21 NOTE — Telephone Encounter (Signed)
Confirmed and screened for [place] and appt for Monday.

## 2019-08-22 ENCOUNTER — Ambulatory Visit: Payer: Medicaid Other

## 2019-08-22 ENCOUNTER — Other Ambulatory Visit: Payer: Self-pay

## 2019-08-22 DIAGNOSIS — R0602 Shortness of breath: Secondary | ICD-10-CM

## 2019-08-25 ENCOUNTER — Other Ambulatory Visit: Payer: Self-pay

## 2019-08-25 ENCOUNTER — Ambulatory Visit: Payer: Medicaid Other | Admitting: Internal Medicine

## 2019-08-25 ENCOUNTER — Encounter: Payer: Self-pay | Admitting: Internal Medicine

## 2019-08-25 VITALS — BP 160/86 | HR 74 | Temp 97.4°F | Resp 16 | Ht 66.0 in | Wt 272.4 lb

## 2019-08-25 DIAGNOSIS — G4733 Obstructive sleep apnea (adult) (pediatric): Secondary | ICD-10-CM | POA: Diagnosis not present

## 2019-08-25 DIAGNOSIS — I10 Essential (primary) hypertension: Secondary | ICD-10-CM

## 2019-08-25 DIAGNOSIS — J45909 Unspecified asthma, uncomplicated: Secondary | ICD-10-CM

## 2019-08-25 DIAGNOSIS — Z6841 Body Mass Index (BMI) 40.0 and over, adult: Secondary | ICD-10-CM

## 2019-08-25 NOTE — Progress Notes (Signed)
[facility] [place], [place] [address]  Pulmonary Sleep Medicine   Office Visit Note  Patient Name: Brianna Gray DOB: Jan 23, 1951 MRN [id number]  Date of Service: 08/25/2019  Complaints/HPI: Pt is here to establish care with pulmonary.  She had a Echo and PSG.  Her echo showed diastolic dysfunction, trace TR, an a normal LVEF.  Her PSG showed an overall ahi of 36.3 consistent with severe sleep apnea.  She had an overall o2 sat of 92% with a nadir 72%. He heart rate was between 51 and 89 bpm.  Her Granddaughter is in exam room with her. She reports the patient has been having increased difficulty sleeping, as well as snoring.  This is likely due to her OSA. She also has a history of asthma, that is currently well controlled.  She is morbidly obese with a bmi of 43.    ROS  General: (-) fever, (-) chills, (-) night sweats, (-) weakness Skin: (-) rashes, (-) itching,. Eyes: (-) visual changes, (-) redness, (-) itching. Nose and Sinuses: (-) nasal stuffiness or itchiness, (-) postnasal drip, (-) nosebleeds, (-) sinus trouble. Mouth and Throat: (-) sore throat, (-) hoarseness. Neck: (-) swollen glands, (-) enlarged thyroid, (-) neck pain. Respiratory: - cough, (-) bloody sputum, - shortness of breath, - wheezing. Cardiovascular: - ankle swelling, (-) chest pain. Lymphatic: (-) lymph node enlargement. Neurologic: (-) numbness, (-) tingling. Psychiatric: (-) anxiety, (-) depression   Current Medication: Outpatient Encounter Medications as of 08/25/2019  Medication Sig   acetic acid-hydrocortisone (VOSOL-HC) OTIC solution Place 3 drops into both ears 2 (two) times daily. As needed for itching   ADVAIR DISKUS 250-50 MCG/DOSE AEPB INHALE 1 PUFF INTO THE LUNGS 2 (TWO) TIMES DAILY AS NEEDED. FOR RESPIRATORY ISSUES.   atorvastatin (LIPITOR) 10 MG tablet TAKE 1 TABLET BY MOUTH EVERYDAY AT BEDTIME   esomeprazole (NEXIUM) 40 MG capsule TAKE 1 CAPSULE BY MOUTH EVERY  DAY FOR STOMACH   fluticasone (FLONASE) 50 MCG/ACT nasal spray Place 1-2 sprays into both nostrils daily as needed. For nasal congestion.   furosemide (LASIX) 40 MG tablet Take 1 tablet (40 mg total) by mouth daily.   hydrALAZINE (APRESOLINE) 25 MG tablet TAKE 1 TABLET BY MOUTH THREE TIMES A DAY   ipratropium-albuterol (DUONEB) 0.5-2.5 (3) MG/3ML SOLN One vial tid prn for asthma, faillure to MDI   levothyroxine (SYNTHROID) 88 MCG tablet TAKE 1 TABLET BY MOUTH EVERY MORNING ON EMPTY STOMACH**OK TO CHANGE TO [place] PER MD**   loratadine (CLARITIN) 10 MG tablet TAKE 1 TABLET BY MOUTH EVERY DAY   losartan (COZAAR) 100 MG tablet TAKE 1 TABLET BY MOUTH EVERY DAY   meloxicam (MOBIC) 7.5 MG tablet TAKE 1 TABLET BY MOUTH TWICE A DAY FOR LOWER BACK AND JOINT PAINS   montelukast (SINGULAIR) 10 MG tablet Take 1 tablet (10 mg total) by mouth at bedtime. For sinus congestion and allergies   Potassium 99 MG TABS Take 99 mg by mouth daily at 12 noon.   vitamin B-12 (CYANOCOBALAMIN) 1000 MCG tablet Take 1,000 mcg by mouth daily at 12 noon.   No facility-administered encounter medications on file as of 08/25/2019.    Surgical History: Past Surgical History:  Procedure Laterality Date   CHOLECYSTECTOMY     HERNIA REPAIR     HERNIA REPAIR     HYSTEROSCOPY WITH D & C N/A 03/19/2015   Procedure: DILATATION AND CURETTAGE /HYSTEROSCOPY and polypectomy;  Surgeon: Honor Loh Ward, MD;  Location: [facility] ORS;  Service:  Gynecology;  Laterality: N/A;   LAPAROSCOPIC HYSTERECTOMY N/A 06/08/2016   Procedure: HYSTERECTOMY TOTAL LAPAROSCOPIC;  Surgeon: Gae Dry, MD;  Location: [facility] ORS;  Service: Gynecology;  Laterality: N/A;   LAPAROSCOPIC SALPINGO OOPHERECTOMY Bilateral 06/08/2016   Procedure: LAPAROSCOPIC SALPINGO OOPHORECTOMY;  Surgeon: Gae Dry, MD;  Location: [facility] ORS;  Service: Gynecology;  Laterality: Bilateral;   LAPAROTOMY N/A 06/08/2016   Procedure: LAPAROTOMY;  Surgeon: Gae Dry, MD;  Location: [facility] ORS;  Service: Gynecology;  Laterality: N/A;    Medical History: Past Medical History:  Diagnosis Date   Asthma    Cardiomyopathy ([place])    Carpal tunnel syndrome    Carpal tunnel syndrome    Dizziness    Edema    Elevated lipids    GERD (gastroesophageal reflux disease)    Hypertension    Lower extremity edema    Seasonal allergies    Sleep apnea    Wheezing     Family History: Family History  Problem Relation Age of Onset   Diabetes Brother    Hypertension Brother    Breast cancer Neg Hx     Social History: Social History   Socioeconomic History   Marital status: Married    Spouse name: Not on file   Number of children: Not on file   Years of education: Not on file   Highest education level: Not on file  Occupational History   Not on file  Tobacco Use   Smoking status: Never Smoker   Smokeless tobacco: Former Systems developer    Types: Snuff  Substance and Sexual Activity   Alcohol use: No   Drug use: No   Sexual activity: Never  Other Topics Concern   Not on file  Social History Narrative   ** Merged History Encounter **       Social Determinants of Health   Financial Resource Strain:    Difficulty of Paying Living Expenses:   Food Insecurity:    Worried About Charity fundraiser in the Last Year:    Arboriculturist in the Last Year:   Transportation Needs:    Film/video editor (Medical):    Lack of Transportation (Non-Medical):   Physical Activity:    Days of Exercise per Week:    Minutes of Exercise per Session:   Stress:    Feeling of Stress :   Social Connections:    Frequency of Communication with Friends and Family:    Frequency of Social Gatherings with Friends and Family:    Attends Religious Services:    Active Member of Clubs or Organizations:    Attends Music therapist:    Marital Status:   Intimate Partner Violence:    Fear of Current or Ex-Partner:     Emotionally Abused:    Physically Abused:    Sexually Abused:     Vital Signs: Blood pressure (!) 186/89, pulse 74, temperature (!) 97.4 F (36.3 C), resp. rate 16, height 5\' 6"  (1.676 m), weight 272 lb 6.4 oz (123.6 kg), SpO2 93 %.  Examination: General Appearance: The patient is well-developed, well-nourished, and in no distress. Skin: Gross inspection of skin unremarkable. Head: normocephalic, no gross deformities. Eyes: no gross deformities noted. ENT: ears appear grossly normal no exudates. Neck: Supple. No thyromegaly. No LAD. Respiratory: clear bilaterally. Cardiovascular: Normal S1 and S2 without murmur or rub. Extremities: No cyanosis. pulses are equal. Neurologic: Alert and oriented. No involuntary movements.  LABS: Recent Results (from the past  2160 hour(s))  Basic metabolic panel     Status: Abnormal   Collection Time: 07/30/19  1:53 PM  Result Value Ref Range   Sodium 140 135 - 145 mmol/L   Potassium 3.8 3.5 - 5.1 mmol/L   Chloride 100 98 - 111 mmol/L   CO2 29 22 - 32 mmol/L   Glucose, Bld 110 (H) 70 - 99 mg/dL    Comment: Glucose reference range applies only to samples taken after fasting for at least 8 hours.   BUN 17 8 - 23 mg/dL   Creatinine, Ser 0.89 0.44 - 1.00 mg/dL   Calcium 9.7 8.9 - 10.3 mg/dL   GFR calc non Af Amer >60 >60 mL/min   GFR calc Af Amer >60 >60 mL/min   Anion gap 11 5 - 15    Comment: Performed at [facility], [address] [place], [place] [address]  CBC     Status: Abnormal   Collection Time: 07/30/19  1:53 PM  Result Value Ref Range   WBC 9.0 4.0 - 10.5 K/uL   RBC 4.75 3.87 - 5.11 MIL/uL   Hemoglobin 11.8 (L) 12.0 - 15.0 g/dL   HCT 37.5 36 - 46 %   MCV 78.9 (L) 80.0 - 100.0 fL   MCH 24.8 (L) 26.0 - 34.0 pg   MCHC 31.5 30.0 - 36.0 g/dL   RDW 15.9 (H) 11.5 - 15.5 %   Platelets 258 150 - 400 K/uL   nRBC 0.0 0.0 - 0.2 %    Comment: Performed at [facility], [place], [place] [address]   Troponin I (High Sensitivity)     Status: None   Collection Time: 07/30/19  1:53 PM  Result Value Ref Range   Troponin I (High Sensitivity) 10 <18 ng/L    Comment: (NOTE) Elevated high sensitivity troponin I (hsTnI) values and significant  changes across serial measurements may suggest ACS but many other  chronic and acute conditions are known to elevate hsTnI results.  Refer to the "Links" section for chest pain algorithms and additional  guidance. Performed at [facility], [address], [place],  [address]     Radiology: DG Chest 2 View  Result Date: 07/30/2019 CLINICAL DATA:  Shortness of breath. Additional provided: Hypertension, systolic blood pressure greater than 200, swelling in feet EXAM: CHEST - 2 VIEW COMPARISON:  Prior chest radiograph 05/24/2016 and earlier. FINDINGS: Heart size within normal limits. Aortic atherosclerosis. No appreciable airspace consolidation within the lungs. No frank pulmonary edema. No evidence of pleural effusion or pneumothorax. No acute bony abnormality identified. Thoracic spondylosis. IMPRESSION: No evidence of acute cardiopulmonary abnormality. Aortic Atherosclerosis (ICD10-I70.0). Thoracic spondylosis. Electronically Signed   By: Kellie Simmering DO   On: 07/30/2019 14:37   US Venous Img Lower Bilateral  Result Date: 07/30/2019 CLINICAL DATA:  Leg swelling EXAM: BILATERAL LOWER EXTREMITY VENOUS DOPPLER ULTRASOUND TECHNIQUE: Gray-scale sonography with compression, as well as color and duplex ultrasound, were performed to evaluate the deep venous system(s) from the level of the common femoral vein through the popliteal and proximal calf veins. COMPARISON:  None. FINDINGS: VENOUS Normal compressibility of the common femoral, superficial femoral, and popliteal veins, as well as the visualized calf veins. Visualized portions of profunda femoral vein and great saphenous vein unremarkable. No filling defects to suggest DVT on grayscale or color  Doppler imaging. Doppler waveforms show normal direction of venous flow, normal respiratory plasticity and response to augmentation. OTHER None. Limitations: none IMPRESSION: Negative.  Electronically Signed   By: Rolm Baptise M.D.   On: 07/30/2019 18:13    No results found.  DG Chest 2 View  Result Date: 07/30/2019 CLINICAL DATA:  Shortness of breath. Additional provided: Hypertension, systolic blood pressure greater than 200, swelling in feet EXAM: CHEST - 2 VIEW COMPARISON:  Prior chest radiograph 05/24/2016 and earlier. FINDINGS: Heart size within normal limits. Aortic atherosclerosis. No appreciable airspace consolidation within the lungs. No frank pulmonary edema. No evidence of pleural effusion or pneumothorax. No acute bony abnormality identified. Thoracic spondylosis. IMPRESSION: No evidence of acute cardiopulmonary abnormality. Aortic Atherosclerosis (ICD10-I70.0). Thoracic spondylosis. Electronically Signed   By: Kellie Simmering DO   On: 07/30/2019 14:37   US Venous Img Lower Bilateral  Result Date: 07/30/2019 CLINICAL DATA:  Leg swelling EXAM: BILATERAL LOWER EXTREMITY VENOUS DOPPLER ULTRASOUND TECHNIQUE: Gray-scale sonography with compression, as well as color and duplex ultrasound, were performed to evaluate the deep venous system(s) from the level of the common femoral vein through the popliteal and proximal calf veins. COMPARISON:  None. FINDINGS: VENOUS Normal compressibility of the common femoral, superficial femoral, and popliteal veins, as well as the visualized calf veins. Visualized portions of profunda femoral vein and great saphenous vein unremarkable. No filling defects to suggest DVT on grayscale or color Doppler imaging. Doppler waveforms show normal direction of venous flow, normal respiratory plasticity and response to augmentation. OTHER None. Limitations: none IMPRESSION: Negative. Electronically Signed   By: Rolm Baptise M.D.   On: 07/30/2019 18:13      Assessment and  Plan: Patient Active Problem List   Diagnosis Date Noted   Morbid obesity ([place]) 08/21/2016   Primary osteoarthritis of right knee 07/01/2014    1. OSA (obstructive sleep apnea) Patient will have titration study to eval for appropriate resolution of symptoms for future machine.  - Cpap titration; Future  2. Moderate asthma, unspecified whether complicated, unspecified whether persistent Continue advair as prescribed.  3. Morbid obesity ([place]) bmi over 40  4. BMI 40.0-44.9, adult (HCC) Obesity Counseling: Risk Assessment: An assessment of behavioral risk factors was made today and includes lack of exercise sedentary lifestyle, lack of portion control and poor dietary habits.  Risk Modification Advice: She was counseled on portion control guidelines. Restricting daily caloric intake to 1800. The detrimental long term effects of obesity on her health and ongoing poor compliance was also discussed with the patient.  5. Essential hypertension, malignant Elevated today,    General Counseling: I have discussed the findings of the evaluation and examination with Brianna Gray.  I have also discussed any further diagnostic evaluation thatmay be needed or ordered today. Brianna Gray verbalizes understanding of the findings of todays visit. We also reviewed her medications today and discussed drug interactions and side effects including but not limited excessive drowsiness and altered mental states. We also discussed that there is always a risk not just to her but also people around her. she has been encouraged to call the office with any questions or concerns that should arise related to todays visit.  No orders of the defined types were placed in this encounter.    Time spent: 30 This patient was seen by Orson Gear AGNP-C in Collaboration with Dr. Devona Konig as a part of collaborative care agreement.   I have personally obtained a history, examined the patient, evaluated laboratory and imaging  results, formulated the assessment and plan and placed orders.    Allyne Gee, MD [facility] Pulmonary and Critical Care Sleep medicine

## 2019-08-26 ENCOUNTER — Telehealth: Payer: Self-pay

## 2019-08-26 NOTE — Telephone Encounter (Signed)
Tried calling pt to schedule and confirm sleep study for 09/10/19. beth

## 2019-08-31 NOTE — Progress Notes (Signed)
Will need sleep study, echo shows Diastolic dysfunction

## 2019-08-31 NOTE — Progress Notes (Signed)
Diastolic dysfunction

## 2019-09-07 NOTE — Procedures (Signed)
[facility] [address] [place], [place] [address]  Patient Name: Brianna Gray DOB: 12-09-1950   SLEEP STUDY INTERPRETATION  DATE OF SERVICE: August 13, 2019   SLEEP STUDY HISTORY: This patient is referred to the sleep lab for a baseline Polysomnography. Pertinent history includes a history of diagnosis of excessive daytime somnolence and snoring.  PROCEDURE: This overnight polysomnogram was performed using the Alice 5 acquisition system using the standard diagnostic protocol as outlined by the AASM. This includes 6 channels of EEG, 2 channelscannels of EOG, chin EMG, bilateral anterior tibialis EMG, nasal/oral thermister, PTAF, chest and abdominal wall movements, ECG and pulse oximetry. Apneas and Hypopneas were scored per AASM definition.  SLEEP ARCHITECHTURE: This is a baseline polysomnograph  study. The total recording time was 404.7 minutes and the patients total sleep time is noted to be 235.0 minutes. Sleep onset latency was 10.8 minutes and is normal.  Stage R sleep onset latency was 121.0 minutes. Sleep maintenance efficiency was 60.8% and is decreased.  Sleep staging expressed as a percentage of total sleep time demonstrated 21.1% N1, 56.8% N2 and 12.8% N3  sleep. Stage R represents 9.4% of total sleep time. This is decreased.  There were a total of 191 arousals  for an overall arousal index of 48.8 per hour of sleep. PLMS arousal were not noted. Arousals without respiratory events are  noted. This can contribute to sleep architechture disruption.  RESPIRATORY MONITORING:   Patient exhibits very significant evidence of sleep disorderd breathing characterized by 19 central apneas, 29 obstructive apneas and 15 mixed apneas. There were 79 obstructive hypopneas and 9 RERAs. Most of the apneas/hypopneas were of obstructive central and mixed variety. The total apnea hypopnea index (apneas and hypopneas per hour of sleep) is 36.3 respiratory events per hour and is  severe.  Respiratory monitoring demonstrated severe snoring through the night. There are a total of 361 snoring episodes representing 35.6% of sleep.   Baseline oxygen saturation during wakefulness was 93% and during NREM sleep averaged 92% through the night. Arterial saturation during REM sleep was 89% through the night. There was significant  oxygen desaturation with the respiratory events. Arterial oxygen desaturation occurred of at least 4% was noted with a low saturation of 72%. The study was performed off oxygen.  CARDIAC MONITORING:   Average heart rate is 70 during sleep with a high of 89 beats per minute. Malignant arrhythmias were not noted.    IMPRESSIONS:  --This overnight polysomnogram demonstrates presence of severe sleep apnea with an overall AHI 36.3 per hour. --The overall AHI was no worse  during Stage R. --There were associated severe arterial oxygen desaturations noted down to 72% --There were no significant PLMS noted in this study. --There is severe snoring noted throughout the study.    RECOMMENDATIONS:  --CPAP titration study is indicated in this case due to severe obstructive/mixed sleep apnea. --Nasal decongestants and antihistamines may be of help for increased upper airways resistance when present. --Weight loss through dietary and lifestyle modification is recommended in the presence of obesity. --A search for and treatment of any underlying cardiopulmonary disease is      recommended in the presence of oxygen desaturations. --Alternative treatment options if the patient is not willing to use CPAP include oral   appliances as well as surgical intervention which may help in the appropriate patient. --Clinical correlation is recommended. Please feel free to call the office for any further  questions or assistance in the care of this patient.  Allyne Gee, MD [facility] Pulmonary Critical Care Medicine Sleep medicine

## 2019-09-10 ENCOUNTER — Encounter: Payer: Medicaid Other | Admitting: Internal Medicine

## 2019-09-15 ENCOUNTER — Telehealth: Payer: Self-pay

## 2019-09-15 NOTE — Telephone Encounter (Signed)
LVM TO CONFIRM PATIENT APPT.-AR

## 2019-09-15 NOTE — Telephone Encounter (Signed)
Confirmed appointment on 09/17/2019 and screened for covid. klh

## 2019-09-17 ENCOUNTER — Ambulatory Visit: Payer: Medicaid Other | Admitting: Internal Medicine

## 2019-09-17 DIAGNOSIS — G4733 Obstructive sleep apnea (adult) (pediatric): Secondary | ICD-10-CM

## 2019-09-19 ENCOUNTER — Other Ambulatory Visit: Payer: Self-pay | Admitting: Internal Medicine

## 2019-09-19 ENCOUNTER — Telehealth: Payer: Self-pay

## 2019-09-19 NOTE — Telephone Encounter (Signed)
Confirmed appointment on 09/23/2019 and screened for covid. klh

## 2019-09-23 ENCOUNTER — Encounter: Payer: Self-pay | Admitting: Internal Medicine

## 2019-09-23 ENCOUNTER — Other Ambulatory Visit: Payer: Self-pay | Admitting: Adult Health

## 2019-09-23 ENCOUNTER — Other Ambulatory Visit: Payer: Self-pay

## 2019-09-23 ENCOUNTER — Ambulatory Visit: Payer: Medicaid Other | Admitting: Internal Medicine

## 2019-09-23 VITALS — BP 144/70 | HR 91 | Temp 97.4°F | Resp 16 | Ht 63.0 in | Wt 273.2 lb

## 2019-09-23 DIAGNOSIS — I10 Essential (primary) hypertension: Secondary | ICD-10-CM

## 2019-09-23 DIAGNOSIS — J45909 Unspecified asthma, uncomplicated: Secondary | ICD-10-CM

## 2019-09-23 DIAGNOSIS — G4733 Obstructive sleep apnea (adult) (pediatric): Secondary | ICD-10-CM

## 2019-09-23 DIAGNOSIS — Z6841 Body Mass Index (BMI) 40.0 and over, adult: Secondary | ICD-10-CM

## 2019-09-23 DIAGNOSIS — M1711 Unilateral primary osteoarthritis, right knee: Secondary | ICD-10-CM

## 2019-09-23 NOTE — Progress Notes (Signed)
Pt requesting referral back to [facility] clinic for Ortho to get another injection in her knee.   Also, she is requesting to see Dr. Eston Esters to check her heart. Referrals placed

## 2019-09-23 NOTE — Progress Notes (Unsigned)
[facility] [place], [place] [address]  Pulmonary Sleep Medicine   Office Visit Note  Patient Name: Brianna Gray DOB: 06/18/1950 MRN [id number]  Date of Service: 09/23/2019  Complaints/HPI: Pt is seen today for follow up on Cpap titration study.    ROS  General: (-) fever, (-) chills, (-) night sweats, (-) weakness Skin: (-) rashes, (-) itching,. Eyes: (-) visual changes, (-) redness, (-) itching. Nose and Sinuses: (-) nasal stuffiness or itchiness, (-) postnasal drip, (-) nosebleeds, (-) sinus trouble. Mouth and Throat: (-) sore throat, (-) hoarseness. Neck: (-) swollen glands, (-) enlarged thyroid, (-) neck pain. Respiratory: - cough, (-) bloody sputum, - shortness of breath, - wheezing. Cardiovascular: - ankle swelling, (-) chest pain. Lymphatic: (-) lymph node enlargement. Neurologic: (-) numbness, (-) tingling. Psychiatric: (-) anxiety, (-) depression   Current Medication: Outpatient Encounter Medications as of 09/23/2019  Medication Sig   acetic acid-hydrocortisone (VOSOL-HC) OTIC solution Place 3 drops into both ears 2 (two) times daily. As needed for itching   ADVAIR DISKUS 250-50 MCG/DOSE AEPB INHALE 1 PUFF INTO THE LUNGS 2 (TWO) TIMES DAILY AS NEEDED. FOR RESPIRATORY ISSUES.   atorvastatin (LIPITOR) 10 MG tablet TAKE 1 TABLET BY MOUTH EVERYDAY AT BEDTIME   esomeprazole (NEXIUM) 40 MG capsule TAKE 1 CAPSULE BY MOUTH EVERY DAY FOR STOMACH   fluticasone (FLONASE) 50 MCG/ACT nasal spray Place 1-2 sprays into both nostrils daily as needed. For nasal congestion.   furosemide (LASIX) 40 MG tablet Take 1 tablet (40 mg total) by mouth daily.   hydrALAZINE (APRESOLINE) 25 MG tablet TAKE 1 TABLET BY MOUTH THREE TIMES A DAY   ipratropium-albuterol (DUONEB) 0.5-2.5 (3) MG/3ML SOLN One vial tid prn for asthma, faillure to MDI   levothyroxine (SYNTHROID) 88 MCG tablet TAKE 1 TABLET BY MOUTH EVERY MORNING ON EMPTY STOMACH**OK TO CHANGE TO LANNETT  PER MD**   loratadine (CLARITIN) 10 MG tablet TAKE 1 TABLET BY MOUTH EVERY DAY   losartan (COZAAR) 100 MG tablet TAKE 1 TABLET BY MOUTH EVERY DAY   meloxicam (MOBIC) 7.5 MG tablet TAKE 1 TABLET BY MOUTH TWICE A DAY FOR LOWER BACK AND JOINT PAINS   montelukast (SINGULAIR) 10 MG tablet Take 1 tablet (10 mg total) by mouth at bedtime. For sinus congestion and allergies   Potassium 99 MG TABS Take 99 mg by mouth daily at 12 noon.   vitamin B-12 (CYANOCOBALAMIN) 1000 MCG tablet Take 1,000 mcg by mouth daily at 12 noon.   No facility-administered encounter medications on file as of 09/23/2019.    Surgical History: Past Surgical History:  Procedure Laterality Date   CHOLECYSTECTOMY     HERNIA REPAIR     HERNIA REPAIR     HYSTEROSCOPY WITH D & C N/A 03/19/2015   Procedure: DILATATION AND CURETTAGE /HYSTEROSCOPY and polypectomy;  Surgeon: Honor Loh Ward, MD;  Location: [facility] ORS;  Service: Gynecology;  Laterality: N/A;   LAPAROSCOPIC HYSTERECTOMY N/A 06/08/2016   Procedure: HYSTERECTOMY TOTAL LAPAROSCOPIC;  Surgeon: Gae Dry, MD;  Location: [facility] ORS;  Service: Gynecology;  Laterality: N/A;   LAPAROSCOPIC SALPINGO OOPHERECTOMY Bilateral 06/08/2016   Procedure: LAPAROSCOPIC SALPINGO OOPHORECTOMY;  Surgeon: Gae Dry, MD;  Location: [facility] ORS;  Service: Gynecology;  Laterality: Bilateral;   LAPAROTOMY N/A 06/08/2016   Procedure: LAPAROTOMY;  Surgeon: Gae Dry, MD;  Location: [facility] ORS;  Service: Gynecology;  Laterality: N/A;    Medical History: Past Medical History:  Diagnosis Date   Asthma    Cardiomyopathy ([place])  Carpal tunnel syndrome    Carpal tunnel syndrome    Dizziness    Edema    Elevated lipids    GERD (gastroesophageal reflux disease)    Hypertension    Lower extremity edema    Seasonal allergies    Sleep apnea    Wheezing     Family History: Family History  Problem Relation Age of Onset   Diabetes Brother    Hypertension  Brother    Breast cancer Neg Hx     Social History: Social History   Socioeconomic History   Marital status: Married    Spouse name: Not on file   Number of children: Not on file   Years of education: Not on file   Highest education level: Not on file  Occupational History   Not on file  Tobacco Use   Smoking status: Never Smoker   Smokeless tobacco: Former Systems developer    Types: Snuff  Substance and Sexual Activity   Alcohol use: No   Drug use: No   Sexual activity: Never  Other Topics Concern   Not on file  Social History Narrative   ** Merged History Encounter **       Social Determinants of Health   Financial Resource Strain:    Difficulty of Paying Living Expenses:   Food Insecurity:    Worried About Charity fundraiser in the Last Year:    Arboriculturist in the Last Year:   Transportation Needs:    Film/video editor (Medical):    Lack of Transportation (Non-Medical):   Physical Activity:    Days of Exercise per Week:    Minutes of Exercise per Session:   Stress:    Feeling of Stress :   Social Connections:    Frequency of Communication with Friends and Family:    Frequency of Social Gatherings with Friends and Family:    Attends Religious Services:    Active Member of Clubs or Organizations:    Attends Music therapist:    Marital Status:   Intimate Partner Violence:    Fear of Current or Ex-Partner:    Emotionally Abused:    Physically Abused:    Sexually Abused:     Vital Signs: Blood pressure (!) 144/70, pulse 91, temperature (!) 97.4 F (36.3 C), resp. rate 16, height 5\' 3"  (1.6 m), weight (!) 273 lb 3.2 oz (123.9 kg), SpO2 95 %.  Examination: General Appearance: The patient is well-developed, well-nourished, and in no distress. Skin: Gross inspection of skin unremarkable. Head: normocephalic, no gross deformities. Eyes: no gross deformities noted. ENT: ears appear grossly normal no exudates. Neck:  Supple. No thyromegaly. No LAD. Respiratory: clear bilaterally. Cardiovascular: Normal S1 and S2 without murmur or rub. Extremities: No cyanosis. pulses are equal. Neurologic: Alert and oriented. No involuntary movements.  LABS: Recent Results (from the past 2160 hour(s))  Basic metabolic panel     Status: Abnormal   Collection Time: 07/30/19  1:53 PM  Result Value Ref Range   Sodium 140 135 - 145 mmol/L   Potassium 3.8 3.5 - 5.1 mmol/L   Chloride 100 98 - 111 mmol/L   CO2 29 22 - 32 mmol/L   Glucose, Bld 110 (H) 70 - 99 mg/dL    Comment: Glucose reference range applies only to samples taken after fasting for at least 8 hours.   BUN 17 8 - 23 mg/dL   Creatinine, Ser 0.89 0.44 - 1.00 mg/dL   Calcium 9.7  8.9 - 10.3 mg/dL   GFR calc non Af Amer >60 >60 mL/min   GFR calc Af Amer >60 >60 mL/min   Anion gap 11 5 - 15    Comment: Performed at [facility], [address] [place], [place] [address]  CBC     Status: Abnormal   Collection Time: 07/30/19  1:53 PM  Result Value Ref Range   WBC 9.0 4.0 - 10.5 K/uL   RBC 4.75 3.87 - 5.11 MIL/uL   Hemoglobin 11.8 (L) 12.0 - 15.0 g/dL   HCT 37.5 36 - 46 %   MCV 78.9 (L) 80.0 - 100.0 fL   MCH 24.8 (L) 26.0 - 34.0 pg   MCHC 31.5 30.0 - 36.0 g/dL   RDW 15.9 (H) 11.5 - 15.5 %   Platelets 258 150 - 400 K/uL   nRBC 0.0 0.0 - 0.2 %    Comment: Performed at [facility], [place], [place] [address]  Troponin I (High Sensitivity)     Status: None   Collection Time: 07/30/19  1:53 PM  Result Value Ref Range   Troponin I (High Sensitivity) 10 <18 ng/L    Comment: (NOTE) Elevated high sensitivity troponin I (hsTnI) values and significant  changes across serial measurements may suggest ACS but many other  chronic and acute conditions are known to elevate hsTnI results.  Refer to the "Links" section for chest pain algorithms and additional  guidance. Performed at [facility], [address]  [place],  [address]     Radiology: DG Chest 2 View  Result Date: 07/30/2019 CLINICAL DATA:  Shortness of breath. Additional provided: Hypertension, systolic blood pressure greater than 200, swelling in feet EXAM: CHEST - 2 VIEW COMPARISON:  Prior chest radiograph 05/24/2016 and earlier. FINDINGS: Heart size within normal limits. Aortic atherosclerosis. No appreciable airspace consolidation within the lungs. No frank pulmonary edema. No evidence of pleural effusion or pneumothorax. No acute bony abnormality identified. Thoracic spondylosis. IMPRESSION: No evidence of acute cardiopulmonary abnormality. Aortic Atherosclerosis (ICD10-I70.0). Thoracic spondylosis. Electronically Signed   By: Kellie Simmering DO   On: 07/30/2019 14:37   US Venous Img Lower Bilateral  Result Date: 07/30/2019 CLINICAL DATA:  Leg swelling EXAM: BILATERAL LOWER EXTREMITY VENOUS DOPPLER ULTRASOUND TECHNIQUE: Gray-scale sonography with compression, as well as color and duplex ultrasound, were performed to evaluate the deep venous system(s) from the level of the common femoral vein through the popliteal and proximal calf veins. COMPARISON:  None. FINDINGS: VENOUS Normal compressibility of the common femoral, superficial femoral, and popliteal veins, as well as the visualized calf veins. Visualized portions of profunda femoral vein and great saphenous vein unremarkable. No filling defects to suggest DVT on grayscale or color Doppler imaging. Doppler waveforms show normal direction of venous flow, normal respiratory plasticity and response to augmentation. OTHER None. Limitations: none IMPRESSION: Negative. Electronically Signed   By: Rolm Baptise M.D.   On: 07/30/2019 18:13    No results found.  No results found.    Assessment and Plan: Patient Active Problem List   Diagnosis Date Noted   Morbid obesity ([place]) 08/21/2016   Primary osteoarthritis of right knee 07/01/2014    1. OSA (obstructive sleep apnea) CPAP ordered at  this time.   2. Moderate asthma, unspecified whether complicated, unspecified whether persistent Stable, continue present management.   3. BMI 45.0-49.9, adult (HCC) Obesity Counseling: Risk Assessment: An assessment of behavioral risk factors was made today and includes lack of exercise sedentary  lifestyle, lack of portion control and poor dietary habits.  Risk Modification Advice: She was counseled on portion control guidelines. Restricting daily caloric intake to 1600. The detrimental long term effects of obesity on her health and ongoing poor compliance was also discussed with the patient.  4. Morbid obesity ([place]) BMI over 40  General Counseling: I have discussed the findings of the evaluation and examination with Storm.  I have also discussed any further diagnostic evaluation thatmay be needed or ordered today. Myla verbalizes understanding of the findings of todays visit. We also reviewed her medications today and discussed drug interactions and side effects including but not limited excessive drowsiness and altered mental states. We also discussed that there is always a risk not just to her but also people around her. she has been encouraged to call the office with any questions or concerns that should arise related to todays visit.  No orders of the defined types were placed in this encounter.    Time spent: 25 This patient was seen by Orson Gear AGNP-C in Collaboration with Dr. Devona Konig as a part of collaborative care agreement.   I have personally obtained a history, examined the patient, evaluated laboratory and imaging results, formulated the assessment and plan and placed orders.    Allyne Gee, MD [facility] Pulmonary and Critical Care Sleep medicine

## 2019-09-24 ENCOUNTER — Telehealth: Payer: Self-pay

## 2019-09-24 NOTE — Telephone Encounter (Signed)
Gave [facility] orders for new cpap set up. Brianna Gray 

## 2019-10-03 ENCOUNTER — Telehealth: Payer: Self-pay

## 2019-10-03 NOTE — Telephone Encounter (Signed)
Confirmed and screened for 10-07-19 ov. 

## 2019-10-07 ENCOUNTER — Encounter: Payer: Medicaid Other | Admitting: Internal Medicine

## 2019-10-08 ENCOUNTER — Ambulatory Visit: Payer: Medicaid Other

## 2019-10-13 ENCOUNTER — Other Ambulatory Visit: Payer: Medicaid Other

## 2019-10-17 ENCOUNTER — Telehealth: Payer: Self-pay

## 2019-10-17 NOTE — Telephone Encounter (Signed)
Confirmed and screened for 10-21-19 ov.

## 2019-10-21 ENCOUNTER — Other Ambulatory Visit: Payer: Self-pay

## 2019-10-21 ENCOUNTER — Encounter: Payer: Self-pay | Admitting: Internal Medicine

## 2019-10-21 ENCOUNTER — Ambulatory Visit: Payer: Medicaid Other | Admitting: Internal Medicine

## 2019-10-21 VITALS — BP 158/90 | HR 69 | Temp 98.2°F | Resp 16 | Ht 63.0 in | Wt 272.8 lb

## 2019-10-21 DIAGNOSIS — Z1231 Encounter for screening mammogram for malignant neoplasm of breast: Secondary | ICD-10-CM | POA: Diagnosis not present

## 2019-10-21 DIAGNOSIS — Z0001 Encounter for general adult medical examination with abnormal findings: Secondary | ICD-10-CM

## 2019-10-21 DIAGNOSIS — I10 Essential (primary) hypertension: Secondary | ICD-10-CM | POA: Diagnosis not present

## 2019-10-21 DIAGNOSIS — M25561 Pain in right knee: Secondary | ICD-10-CM | POA: Diagnosis not present

## 2019-10-21 DIAGNOSIS — G4733 Obstructive sleep apnea (adult) (pediatric): Secondary | ICD-10-CM

## 2019-10-21 DIAGNOSIS — G8929 Other chronic pain: Secondary | ICD-10-CM

## 2019-10-21 DIAGNOSIS — M25562 Pain in left knee: Secondary | ICD-10-CM

## 2019-10-21 DIAGNOSIS — B351 Tinea unguium: Secondary | ICD-10-CM

## 2019-10-21 DIAGNOSIS — D508 Other iron deficiency anemias: Secondary | ICD-10-CM

## 2019-10-21 DIAGNOSIS — R300 Dysuria: Secondary | ICD-10-CM

## 2019-10-21 NOTE — Progress Notes (Signed)
[facility] [place], [place] [address]  Internal MEDICINE  Office Visit Note  Patient Name: Brianna Gray  [id number]  [id number]  Date of Service: 10/28/2019  Chief Complaint  Patient presents with  . Annual Exam  . Sleep Apnea  . Hypertension  . Quality Metric Gaps    HepC, TDAP, colonoscopy,dexa, mammogram, PNA     HPI Pt is here for routine health maintenance examination. Brianna Gray continues to have sob, BP is improved after adding Hydralazine, pt is in the process of getting her CPAP for severe OSA. Brianna Gray did see cardiology. Recent labs did show Anemia with slight low normal iron, will need to see GI once CPAP is being used. Chronic knee pain due to weight problems, pt is having problem managing her weight   Current Medication: Outpatient Encounter Medications as of 10/21/2019  Medication Sig  . ADVAIR DISKUS 250-50 MCG/DOSE AEPB INHALE 1 PUFF INTO THE LUNGS 2 (TWO) TIMES DAILY AS NEEDED. FOR RESPIRATORY ISSUES.  Marland Kitchen atorvastatin (LIPITOR) 10 MG tablet TAKE 1 TABLET BY MOUTH EVERYDAY AT BEDTIME  . esomeprazole (NEXIUM) 40 MG capsule TAKE 1 CAPSULE BY MOUTH EVERY DAY FOR STOMACH  . furosemide (LASIX) 40 MG tablet Take 1 tablet (40 mg total) by mouth daily.  . hydrALAZINE (APRESOLINE) 25 MG tablet TAKE 1 TABLET BY MOUTH THREE TIMES A DAY  . ipratropium-albuterol (DUONEB) 0.5-2.5 (3) MG/3ML SOLN One vial tid prn for asthma, faillure to MDI  . levothyroxine (SYNTHROID) 88 MCG tablet TAKE 1 TABLET BY MOUTH EVERY MORNING ON EMPTY STOMACH**OK TO CHANGE TO LANNETT PER MD**  . loratadine (CLARITIN) 10 MG tablet TAKE 1 TABLET BY MOUTH EVERY DAY  . losartan (COZAAR) 100 MG tablet TAKE 1 TABLET BY MOUTH EVERY DAY  . meloxicam (MOBIC) 7.5 MG tablet TAKE 1 TABLET BY MOUTH TWICE A DAY FOR LOWER BACK AND JOINT PAINS  . Potassium 99 MG TABS Take 99 mg by mouth daily at 12 noon.  . vitamin B-12 (CYANOCOBALAMIN) 1000 MCG tablet Take 1,000 mcg by mouth daily at 12 noon.  Marland Kitchen  acetic acid-hydrocortisone (VOSOL-HC) OTIC solution Place 3 drops into both ears 2 (two) times daily. As needed for itching (Patient not taking: Reported on 10/21/2019)  . fluticasone (FLONASE) 50 MCG/ACT nasal spray Place 1-2 sprays into both nostrils daily as needed. For nasal congestion. (Patient not taking: Reported on 10/21/2019)  . montelukast (SINGULAIR) 10 MG tablet Take 1 tablet (10 mg total) by mouth at bedtime. For sinus congestion and allergies (Patient not taking: Reported on 10/21/2019)   No facility-administered encounter medications on file as of 10/21/2019.    Surgical History: Past Surgical History:  Procedure Laterality Date  . CHOLECYSTECTOMY    . HERNIA REPAIR    . HERNIA REPAIR    . HYSTEROSCOPY WITH D & C N/A 03/19/2015   Procedure: DILATATION AND CURETTAGE /HYSTEROSCOPY and polypectomy;  Surgeon: Honor Loh Ward, MD;  Location: [facility] OR[facility];  Service: Gynecology;  Laterality: N/A;  . LAPAROSCOPIC HYSTERECTOMY N/A 06/08/2016   Procedure: HYSTERECTOMY TOTAL LAPAROSCOPIC;  Surgeon: Gae Dry, MD;  Location: [facility];  Service: Gynecology;  Laterality: N/A;  . LAPAROSCOPIC SALPINGO OOPHERECTOMY Bilateral 06/08/2016   Procedure: LAPAROSCOPIC SALPINGO OOPHORECTOMY;  Surgeon: Gae Dry, MD;  Location: [facility];  Service: Gynecology;  Laterality: Bilateral;  . LAPAROTOMY N/A 06/08/2016   Procedure: LAPAROTOMY;  Surgeon: Gae Dry, MD;  Location: [facility];  Service: Gynecology;  Laterality: N/A;    Medical History: Past Medical  History:  Diagnosis Date  . Asthma   . Cardiomyopathy ([place])   . Carpal tunnel syndrome   . Carpal tunnel syndrome   . Dizziness   . Edema   . Elevated lipids   . GERD (gastroesophageal reflux disease)   . Hypertension   . Lower extremity edema   . Seasonal allergies   . Sleep apnea   . Wheezing     Family History: Family History  Problem Relation Age of Onset  . Diabetes Brother   . Hypertension Brother   . Breast cancer Neg Hx     Review of Systems  Constitutional: Negative for chills, diaphoresis and fatigue.  HENT: Negative for ear pain, postnasal drip and sinus pressure.   Eyes: Negative for photophobia, discharge, redness, itching and visual disturbance.  Respiratory: Negative for cough, shortness of breath and wheezing.   Cardiovascular: Negative for chest pain, palpitations and leg swelling.  Gastrointestinal: Negative for abdominal pain, constipation, diarrhea, nausea and vomiting.  Genitourinary: Negative for dysuria and flank pain.  Musculoskeletal: Positive for arthralgias. Negative for back pain, gait problem and neck pain.  Skin: Negative for color change.  Allergic/Immunologic: Negative for environmental allergies and food allergies.  Neurological: Negative for dizziness and headaches.  Hematological: Does not bruise/bleed easily.  Psychiatric/Behavioral: Negative for agitation, behavioral problems (depression) and hallucinations.   Vital Signs: BP (!) 158/90   Pulse 69   Temp 98.2 F (36.8 C)   Resp 16   Ht _0  (1.6 m)   Wt 272 lb 12.8 oz (123.7 kg)   SpO2 97%   BMI 48.32 kg/m    Physical Exam Constitutional:      General: Brianna Gray is not in acute distress.    Appearance: Brianna Gray is well-developed. Brianna Gray is not diaphoretic.  HENT:     Head: Normocephalic and atraumatic.     Mouth/Throat:     Pharynx: No oropharyngeal exudate.  Eyes:     Pupils: Pupils are equal, round, and reactive to light.  Neck:     Thyroid: No thyromegaly.     Vascular: No JVD.     Trachea: No tracheal deviation.  Cardiovascular:     Rate and Rhythm: Normal rate and regular rhythm.     Heart sounds: Normal heart sounds. No murmur heard.  No friction rub. No gallop.   Pulmonary:     Effort: Pulmonary effort is normal. No respiratory distress.     Breath sounds: No wheezing or rales.  Chest:     Chest wall: No tenderness.  Abdominal:     General: Bowel sounds are normal.     Palpations: Abdomen is soft.   Musculoskeletal:        General: Normal range of motion.     Cervical back: Normal range of motion and neck supple.  Lymphadenopathy:     Cervical: No cervical adenopathy.  Skin:    General: Skin is warm and dry.     Comments: Toe nails are yellow with thickened edges   Neurological:     Mental Status: Brianna Gray is alert and oriented to person, place, and time.     Cranial Nerves: No cranial nerve deficit.  Psychiatric:        Behavior: Behavior normal.        Thought Content: Thought content normal.        Judgment: Judgment normal.    LABS: Recent Results (from the past 2160 hour(s))  UA/M w/rflx Culture, Routine     Status: None  Collection Time: 10/21/19 12:30 PM   Specimen: Urine   Urine  Result Value Ref Range   Specific Gravity, UA 1.016 1.005 - 1.030   pH, UA 6.5 5.0 - 7.5   Color, UA Yellow Yellow   Appearance Ur Clear Clear   Leukocytes,UA Negative Negative   Protein,UA Trace Negative/Trace   Glucose, UA Negative Negative   Ketones, UA Negative Negative   RBC, UA Negative Negative   Bilirubin, UA Negative Negative   Urobilinogen, Ur 0.2 0.2 - 1.0 mg/dL   Nitrite, UA Negative Negative   Microscopic Examination Comment     Comment: Microscopic follows if indicated.   Microscopic Examination See below:     Comment: Microscopic was indicated and was performed.   Urinalysis Reflex Comment     Comment: This specimen will not reflex to a Urine Culture.  Microscopic Examination     Status: None   Collection Time: 10/21/19 12:30 PM   Urine  Result Value Ref Range   WBC, UA None seen 0 - 5 /hpf   RBC None seen 0 - 2 /hpf   Epithelial Cells (non renal) 0-10 0 - 10 /hpf   Casts None seen None seen /lpf   Bacteria, UA None seen None seen/Few  CBC With Differential     Status: Abnormal   Collection Time: 10/22/19 11:19 AM  Result Value Ref Range   WBC 8.6 3.4 - 10.8 x10E3/uL   RBC 4.53 3.77 - 5.28 x10E6/uL   Hemoglobin 11.4 11.1 - 15.9 g/dL   Hematocrit 35.5 34.0 -  46.6 %   MCV 78 (L) 79 - 97 fL   MCH 25.2 (L) 26.6 - 33.0 pg   MCHC 32.1 31 - 35 g/dL   RDW 14.5 11.7 - 15.4 %   Neutrophils 66 Not Estab. %   Lymphs 22 Not Estab. %   Monocytes 6 Not Estab. %   Eos 5 Not Estab. %   Basos 0 Not Estab. %   Neutrophils Absolute 5.7 1 - 7 x10E3/uL   Lymphocytes Absolute 1.9 0 - 3 x10E3/uL   Monocytes Absolute 0.6 0 - 0 x10E3/uL   EOS (ABSOLUTE) 0.4 0.0 - 0.4 x10E3/uL   Basophils Absolute 0.0 0 - 0 x10E3/uL   Immature Granulocytes 1 Not Estab. %   Immature Grans (Abs) 0.1 0.0 - 0.1 x10E3/uL  Fe+TIBC+Fer     Status: Abnormal   Collection Time: 10/22/19 11:19 AM  Result Value Ref Range   Total Iron Binding Capacity 397 250 - 450 ug/dL   UIBC 356 118 - 369 ug/dL   Iron 41 27 - 139 ug/dL   Iron Saturation 10 (L) 15 - 55 %   Ferritin 34 15.0 - 150.0 ng/mL  B12 and Folate Panel     Status: None   Collection Time: 10/22/19 11:19 AM  Result Value Ref Range   Vitamin B-12 1,136 232 - 1,245 pg/mL   Folate 7.4 >3.0 ng/mL    Comment: A serum folate concentration of less than 3.1 ng/mL is considered to represent clinical deficiency.   Basic Metabolic Panel (BMET)     Status: Abnormal   Collection Time: 10/22/19 11:19 AM  Result Value Ref Range   Glucose 104 (H) 65 - 99 mg/dL   BUN 13 8 - 27 mg/dL   Creatinine, Ser 0.83 0.57 - 1.00 mg/dL   GFR calc non Af Amer 72 >59 mL/min/1.73   GFR calc Af Amer 83 >59 mL/min/1.73    Comment: **[facility] currently reports  eGFR in compliance with the current**   recommendations of the [employer]. [facility] will   update reporting as new guidelines are published from the NK[employer]-ASN   Task force.    BUN/Creatinine Ratio 16 12 - 28   Sodium 141 134 - 144 mmol/L   Potassium 4.4 3.5 - 5.2 mmol/L   Chloride 100 96 - 106 mmol/L   CO2 27 20 - 29 mmol/L   Calcium 9.9 8.7 - 10.3 mg/dL    Assessment/Plan: 1. Encounter for general adult medical examination with abnormal findings - Discussion about getting  mammogram and colonoscopy, discussed adherence to preventive health maintenance   2. Encounter for mammogram to establish baseline mammogram - MM DIGITAL SCREENING BILATERAL; Future  3. Essential hypertension, malignant - Improved BP, continue all medications as before  - Basic Metabolic Panel (BMET)  4. Chronic pain of both knees - Avoid NSAIDS, can make kidney functions worse, will see ortho  - Ambulatory referral to Orthopedic Surgery  5. Morbid obesity ([place]) - Obesity Counseling: Risk Assessment: An assessment of behavioral risk factors was made today and includes lack of exercise sedentary lifestyle, lack of portion control and poor dietary habits.  Risk Modification Advice: Brianna Gray was counseled on portion control guidelines. Restricting daily caloric intake to 1500 . The detrimental long term effects of obesity on her health and ongoing poor compliance was also discussed with the patient.  6. OSA (obstructive sleep apnea) - CPAP set up  7. Nail fungus - Ambulatory referral to Podiatry  8. Other iron deficiency anemia - Need to see GI  - CBC With Differential - Fe+TIBC+Fer - B12 and Folate Panel  9. Dysuria - UA/M w/rflx Culture, Routine - Microscopic Examination  General Counseling: Verley verbalizes understanding of the findings of todays visit and agrees with plan of treatment. I have discussed any further diagnostic evaluation that may be needed or ordered today. We also reviewed her medications today. Brianna Gray has been encouraged to call the office with any questions or concerns that should arise related to todays visit.  Orders Placed This Encounter  Procedures  . Microscopic Examination  . MM DIGITAL SCREENING BILATERAL  . UA/M w/rflx Culture, Routine  . CBC With Differential  . Fe+TIBC+Fer  . B12 and Folate Panel  . Basic Metabolic Panel (BMET)  . Ambulatory referral to Orthopedic Surgery  . Ambulatory referral to Podiatry    Total time spent: 45 Minutes  Time  spent includes review of chart, medications, test results, and follow up plan with the patient.     Lavera Guise, MD  Internal Medicine

## 2019-10-22 LAB — UA/M W/RFLX CULTURE, ROUTINE
Bilirubin, UA: NEGATIVE
Glucose, UA: NEGATIVE
Ketones, UA: NEGATIVE
Leukocytes,UA: NEGATIVE
Nitrite, UA: NEGATIVE
RBC, UA: NEGATIVE
Specific Gravity, UA: 1.016 (ref 1.005–1.030)
Urobilinogen, Ur: 0.2 mg/dL (ref 0.2–1.0)
pH, UA: 6.5 (ref 5.0–7.5)

## 2019-10-22 LAB — MICROSCOPIC EXAMINATION
Bacteria, UA: NONE SEEN
Casts: NONE SEEN /lpf
RBC, Urine: NONE SEEN /hpf (ref 0–2)
WBC, UA: NONE SEEN /hpf (ref 0–5)

## 2019-10-23 LAB — BASIC METABOLIC PANEL
BUN/Creatinine Ratio: 16 (ref 12–28)
BUN: 13 mg/dL (ref 8–27)
CO2: 27 mmol/L (ref 20–29)
Calcium: 9.9 mg/dL (ref 8.7–10.3)
Chloride: 100 mmol/L (ref 96–106)
Creatinine, Ser: 0.83 mg/dL (ref 0.57–1.00)
GFR calc Af Amer: 83 mL/min/{1.73_m2} (ref >59)
GFR calc non Af Amer: 72 mL/min/{1.73_m2} (ref >59)
Glucose: 104 mg/dL — ABNORMAL HIGH (ref 65–99)
Potassium: 4.4 mmol/L (ref 3.5–5.2)
Sodium: 141 mmol/L (ref 134–144)

## 2019-10-23 LAB — CBC WITH DIFFERENTIAL
Basophils Absolute: 0 10*3/uL (ref 0.0–0.2)
Basos: 0 %
EOS (ABSOLUTE): 0.4 10*3/uL (ref 0.0–0.4)
Eos: 5 %
Hematocrit: 35.5 % (ref 34.0–46.6)
Hemoglobin: 11.4 g/dL (ref 11.1–15.9)
Immature Grans (Abs): 0.1 10*3/uL (ref 0.0–0.1)
Immature Granulocytes: 1 %
Lymphocytes Absolute: 1.9 10*3/uL (ref 0.7–3.1)
Lymphs: 22 %
MCH: 25.2 pg — ABNORMAL LOW (ref 26.6–33.0)
MCHC: 32.1 g/dL (ref 31.5–35.7)
MCV: 78 fL — ABNORMAL LOW (ref 79–97)
Monocytes Absolute: 0.6 10*3/uL (ref 0.1–0.9)
Monocytes: 6 %
Neutrophils Absolute: 5.7 10*3/uL (ref 1.4–7.0)
Neutrophils: 66 %
RBC: 4.53 x10E6/uL (ref 3.77–5.28)
RDW: 14.5 % (ref 11.7–15.4)
WBC: 8.6 10*3/uL (ref 3.4–10.8)

## 2019-10-23 LAB — IRON,TIBC AND FERRITIN PANEL
Ferritin: 34 ng/mL (ref 15–150)
Iron Saturation: 10 % — ABNORMAL LOW (ref 15–55)
Iron: 41 ug/dL (ref 27–139)
Total Iron Binding Capacity: 397 ug/dL (ref 250–450)
UIBC: 356 ug/dL (ref 118–369)

## 2019-10-23 LAB — B12 AND FOLATE PANEL
Folate: 7.4 ng/mL (ref >3.0)
Vitamin B-12: 1136 pg/mL (ref 232–1245)

## 2019-10-29 ENCOUNTER — Other Ambulatory Visit: Payer: Self-pay

## 2019-10-29 ENCOUNTER — Ambulatory Visit: Payer: Medicaid Other

## 2019-10-29 DIAGNOSIS — G4733 Obstructive sleep apnea (adult) (pediatric): Secondary | ICD-10-CM

## 2019-10-29 NOTE — Progress Notes (Signed)
New cpap setup  She recived a new resme S-10 at 5-15 with humidifier, climate line, and a resmed N-30 nasal mask  Her son was with her and helped translate, they both seemed to have a good understanding

## 2019-11-06 DIAGNOSIS — G4733 Obstructive sleep apnea (adult) (pediatric): Secondary | ICD-10-CM | POA: Insufficient documentation

## 2019-11-06 DIAGNOSIS — I10 Essential (primary) hypertension: Secondary | ICD-10-CM | POA: Insufficient documentation

## 2019-11-19 ENCOUNTER — Ambulatory Visit: Payer: Self-pay | Admitting: Podiatry

## 2019-11-26 ENCOUNTER — Ambulatory Visit: Payer: Medicaid Other

## 2019-11-26 ENCOUNTER — Ambulatory Visit: Payer: Self-pay | Admitting: Podiatry

## 2019-11-26 ENCOUNTER — Other Ambulatory Visit: Payer: Self-pay

## 2019-11-26 DIAGNOSIS — G4733 Obstructive sleep apnea (adult) (pediatric): Secondary | ICD-10-CM | POA: Diagnosis not present

## 2019-11-26 NOTE — Progress Notes (Signed)
95 percentile pressure 14.1   95th percentile leak 24.6   apnea index 5.5 /hr  apnea-hypopnea index  5.9 /hr   total days used  >4 hr 8 days  total days used <4 hr 20 days  Total compliance 27 percent  She is putting it on just not wearing long enough. States feeling better, encouraged her to wear longer went over compliance for insurance must be over 4 hours a day

## 2019-11-29 ENCOUNTER — Other Ambulatory Visit: Payer: Self-pay | Admitting: Internal Medicine

## 2019-12-01 ENCOUNTER — Ambulatory Visit: Payer: Medicaid Other | Admitting: Internal Medicine

## 2019-12-02 ENCOUNTER — Ambulatory Visit: Payer: Medicaid Other | Admitting: Internal Medicine

## 2019-12-02 ENCOUNTER — Telehealth: Payer: Self-pay

## 2019-12-02 NOTE — Telephone Encounter (Signed)
Lmom to reschedule missed appt from 12-02-19 ov.

## 2019-12-18 ENCOUNTER — Other Ambulatory Visit: Payer: Self-pay

## 2019-12-18 ENCOUNTER — Ambulatory Visit: Payer: Medicaid Other | Admitting: Internal Medicine

## 2019-12-18 ENCOUNTER — Encounter: Payer: Self-pay | Admitting: Internal Medicine

## 2019-12-18 VITALS — BP 142/80 | HR 78 | Temp 97.9°F | Resp 16 | Ht 63.0 in | Wt 274.6 lb

## 2019-12-18 DIAGNOSIS — G4733 Obstructive sleep apnea (adult) (pediatric): Secondary | ICD-10-CM | POA: Diagnosis not present

## 2019-12-18 DIAGNOSIS — Z23 Encounter for immunization: Secondary | ICD-10-CM

## 2019-12-18 DIAGNOSIS — Z7189 Other specified counseling: Secondary | ICD-10-CM | POA: Diagnosis not present

## 2019-12-18 NOTE — Progress Notes (Signed)
[facility] [place], [place] [address]  Pulmonary Sleep Medicine   Office Visit Note  Patient Name: Brianna Gray DOB: 1950/11/07 MRN [id number]  Date of Service: 12/18/2019  Complaints/HPI: Patient is here for follow-up she is on the CPAP now for severe obstructive sleep apnea.  She states that she is using the machine as prescribed although she is not using it all night.  She states that because she has to take care of her husband she uses the machine from about 2 AM until 6 AM as her usual bedtime is around 1 to 2:00 in the morning.  She states that she feels better she is more rested less foggy.  She does not have snoring no headaches.  ROS  General: (-) fever, (-) chills, (-) night sweats, (-) weakness Skin: (-) rashes, (-) itching,. Eyes: (-) visual changes, (-) redness, (-) itching. Nose and Sinuses: (-) nasal stuffiness or itchiness, (-) postnasal drip, (-) nosebleeds, (-) sinus trouble. Mouth and Throat: (-) sore throat, (-) hoarseness. Neck: (-) swollen glands, (-) enlarged thyroid, (-) neck pain. Respiratory: - cough, (-) bloody sputum, - shortness of breath, - wheezing. Cardiovascular: - ankle swelling, (-) chest pain. Lymphatic: (-) lymph node enlargement. Neurologic: (-) numbness, (-) tingling. Psychiatric: (-) anxiety, (-) depression   Current Medication: Outpatient Encounter Medications as of 12/18/2019  Medication Sig  . acetic acid-hydrocortisone (VOSOL-HC) OTIC solution Place 3 drops into both ears 2 (two) times daily. As needed for itching  . ADVAIR DISKUS 250-50 MCG/DOSE AEPB INHALE 1 PUFF INTO THE LUNGS 2 (TWO) TIMES DAILY AS NEEDED. FOR RESPIRATORY ISSUES.  Marland Kitchen atorvastatin (LIPITOR) 10 MG tablet TAKE 1 TABLET BY MOUTH EVERYDAY AT BEDTIME  . esomeprazole (NEXIUM) 40 MG capsule TAKE 1 CAPSULE BY MOUTH EVERY DAY FOR STOMACH  . fluticasone (FLONASE) 50 MCG/ACT nasal spray Place 1-2 sprays into both nostrils daily as needed. For nasal  congestion.  . furosemide (LASIX) 40 MG tablet Take 1 tablet (40 mg total) by mouth daily.  . hydrALAZINE (APRESOLINE) 25 MG tablet TAKE 1 TABLET BY MOUTH THREE TIMES A DAY  . ipratropium-albuterol (DUONEB) 0.5-2.5 (3) MG/3ML SOLN One vial tid prn for asthma, faillure to MDI  . levothyroxine (SYNTHROID) 88 MCG tablet TAKE 1 TABLET BY MOUTH EVERY MORNING ON EMPTY STOMACH**OK TO CHANGE TO LANNETT PER MD**  . loratadine (CLARITIN) 10 MG tablet TAKE 1 TABLET BY MOUTH EVERY DAY  . losartan (COZAAR) 100 MG tablet TAKE 1 TABLET BY MOUTH EVERY DAY  . meloxicam (MOBIC) 7.5 MG tablet TAKE 1 TABLET BY MOUTH TWICE A DAY FOR LOWER BACK AND JOINT PAINS  . montelukast (SINGULAIR) 10 MG tablet Take 1 tablet (10 mg total) by mouth at bedtime. For sinus congestion and allergies  . Potassium 99 MG TABS Take 99 mg by mouth daily at 12 noon.  . vitamin B-12 (CYANOCOBALAMIN) 1000 MCG tablet Take 1,000 mcg by mouth daily at 12 noon.   No facility-administered encounter medications on file as of 12/18/2019.    Surgical History: Past Surgical History:  Procedure Laterality Date  . CHOLECYSTECTOMY    . HERNIA REPAIR    . HERNIA REPAIR    . HYSTEROSCOPY WITH D & C N/A 03/19/2015   Procedure: DILATATION AND CURETTAGE /HYSTEROSCOPY and polypectomy;  Surgeon: Honor Loh Ward, MD;  Location: [facility] ORS;  Service: Gynecology;  Laterality: N/A;  . LAPAROSCOPIC HYSTERECTOMY N/A 06/08/2016   Procedure: HYSTERECTOMY TOTAL LAPAROSCOPIC;  Surgeon: Gae Dry, MD;  Location: [facility] ORS;  Service: Gynecology;  Laterality: N/A;  . LAPAROSCOPIC SALPINGO OOPHERECTOMY Bilateral 06/08/2016   Procedure: LAPAROSCOPIC SALPINGO OOPHORECTOMY;  Surgeon: Gae Dry, MD;  Location: [facility] ORS;  Service: Gynecology;  Laterality: Bilateral;  . LAPAROTOMY N/A 06/08/2016   Procedure: LAPAROTOMY;  Surgeon: Gae Dry, MD;  Location: [facility] ORS;  Service: Gynecology;  Laterality: N/A;    Medical History: Past Medical History:  Diagnosis  Date  . Asthma   . Cardiomyopathy ([place])   . Carpal tunnel syndrome   . Carpal tunnel syndrome   . Dizziness   . Edema   . Elevated lipids   . GERD (gastroesophageal reflux disease)   . Hypertension   . Lower extremity edema   . Seasonal allergies   . Sleep apnea   . Wheezing     Family History: Family History  Problem Relation Age of Onset  . Diabetes Brother   . Hypertension Brother   . Breast cancer Neg Hx     Social History: Social History   Socioeconomic History  . Marital status: Married    Spouse name: Not on file  . Number of children: Not on file  . Years of education: Not on file  . Highest education level: Not on file  Occupational History  . Not on file  Tobacco Use  . Smoking status: Never Smoker  . Smokeless tobacco: Current User    Types: Chew  Substance and Sexual Activity  . Alcohol use: No  . Drug use: No  . Sexual activity: Never  Other Topics Concern  . Not on file  Social History Narrative   ** Merged History Encounter **       Social Determinants of Health   Financial Resource Strain:   . Difficulty of Paying Living Expenses: Not on file  Food Insecurity:   . Worried About Charity fundraiser in the Last Year: Not on file  . Ran Out of Food in the Last Year: Not on file  Transportation Needs:   . Lack of Transportation (Medical): Not on file  . Lack of Transportation (Non-Medical): Not on file  Physical Activity:   . Days of Exercise per Week: Not on file  . Minutes of Exercise per Session: Not on file  Stress:   . Feeling of Stress : Not on file  Social Connections:   . Frequency of Communication with Friends and Family: Not on file  . Frequency of Social Gatherings with Friends and Family: Not on file  . Attends Religious Services: Not on file  . Active Member of Clubs or Organizations: Not on file  . Attends Archivist Meetings: Not on file  . Marital Status: Not on file  Intimate Partner Violence:   . Fear of  Current or Ex-Partner: Not on file  . Emotionally Abused: Not on file  . Physically Abused: Not on file  . Sexually Abused: Not on file    Vital Signs: Blood pressure (!) 142/80, pulse 78, temperature 97.9 F (36.6 C), resp. rate 16, height '5\' 3"'  (1.6 m), weight 274 lb 9.6 oz (124.6 kg), SpO2 99 %.  Examination: General Appearance: The patient is well-developed, well-nourished, and in no distress. Skin: Gross inspection of skin unremarkable. Head: normocephalic, no gross deformities. Eyes: no gross deformities noted. ENT: ears appear grossly normal no exudates. Neck: Supple. No thyromegaly. No LAD. Respiratory: No rhonchi no rales are noted at this time. Cardiovascular: Normal S1 and S2 without murmur or rub. Extremities: No cyanosis. pulses are  equal. Neurologic: Alert and oriented. No involuntary movements.  LABS: Recent Results (from the past 2160 hour(s))  UA/M w/rflx Culture, Routine     Status: None   Collection Time: 10/21/19 12:30 PM   Specimen: Urine   Urine  Result Value Ref Range   Specific Gravity, UA 1.016 1.005 - 1.030   pH, UA 6.5 5.0 - 7.5   Color, UA Yellow Yellow   Appearance Ur Clear Clear   Leukocytes,UA Negative Negative   Protein,UA Trace Negative/Trace   Glucose, UA Negative Negative   Ketones, UA Negative Negative   RBC, UA Negative Negative   Bilirubin, UA Negative Negative   Urobilinogen, Ur 0.2 0.2 - 1.0 mg/dL   Nitrite, UA Negative Negative   Microscopic Examination Comment     Comment: Microscopic follows if indicated.   Microscopic Examination See below:     Comment: Microscopic was indicated and was performed.   Urinalysis Reflex Comment     Comment: This specimen will not reflex to a Urine Culture.  Microscopic Examination     Status: None   Collection Time: 10/21/19 12:30 PM   Urine  Result Value Ref Range   WBC, UA None seen 0 - 5 /hpf   RBC None seen 0 - 2 /hpf   Epithelial Cells (non renal) 0-10 0 - 10 /hpf   Casts None seen  None seen /lpf   Bacteria, UA None seen None seen/Few  CBC With Differential     Status: Abnormal   Collection Time: 10/22/19 11:19 AM  Result Value Ref Range   WBC 8.6 3.4 - 10.8 x10E3/uL   RBC 4.53 3.77 - 5.28 x10E6/uL   Hemoglobin 11.4 11.1 - 15.9 g/dL   Hematocrit 35.5 34.0 - 46.6 %   MCV 78 (L) 79 - 97 fL   MCH 25.2 (L) 26.6 - 33.0 pg   MCHC 32.1 31 - 35 g/dL   RDW 14.5 11.7 - 15.4 %   Neutrophils 66 Not Estab. %   Lymphs 22 Not Estab. %   Monocytes 6 Not Estab. %   Eos 5 Not Estab. %   Basos 0 Not Estab. %   Neutrophils Absolute 5.7 1.40 - 7.00 x10E3/uL   Lymphocytes Absolute 1.9 0 - 3 x10E3/uL   Monocytes Absolute 0.6 0 - 0 x10E3/uL   EOS (ABSOLUTE) 0.4 0.0 - 0.4 x10E3/uL   Basophils Absolute 0.0 0 - 0 x10E3/uL   Immature Granulocytes 1 Not Estab. %   Immature Grans (Abs) 0.1 0.0 - 0.1 x10E3/uL  Fe+TIBC+Fer     Status: Abnormal   Collection Time: 10/22/19 11:19 AM  Result Value Ref Range   Total Iron Binding Capacity 397 250 - 450 ug/dL   UIBC 356 118 - 369 ug/dL   Iron 41 27 - 139 ug/dL   Iron Saturation 10 (L) 15 - 55 %   Ferritin 34 15.0 - 150.0 ng/mL  B12 and Folate Panel     Status: None   Collection Time: 10/22/19 11:19 AM  Result Value Ref Range   Vitamin B-12 1,136 232 - 1,245 pg/mL   Folate 7.4 >3.0 ng/mL    Comment: A serum folate concentration of less than 3.1 ng/mL is considered to represent clinical deficiency.   Basic Metabolic Panel (BMET)     Status: Abnormal   Collection Time: 10/22/19 11:19 AM  Result Value Ref Range   Glucose 104 (H) 65 - 99 mg/dL   BUN 13 8 - 27 mg/dL   Creatinine, Ser 0.83  0.57 - 1.00 mg/dL   GFR calc non Af Amer 72 >59 mL/min/1.73   GFR calc Af Amer 83 >59 mL/min/1.73    Comment: **[facility] currently reports eGFR in compliance with the current**   recommendations of the [employer]. [facility] will   update reporting as new guidelines are published from the NKF-ASN   Task force.    BUN/Creatinine Ratio 16  12 - 28   Sodium 141 134 - 144 mmol/L   Potassium 4.4 3.5 - 5.2 mmol/L   Chloride 100 96 - 106 mmol/L   CO2 27 20 - 29 mmol/L   Calcium 9.9 8.7 - 10.3 mg/dL    Radiology: DG Chest 2 View  Result Date: 07/30/2019 CLINICAL DATA:  Shortness of breath. Additional provided: Hypertension, systolic blood pressure greater than 200, swelling in feet EXAM: CHEST - 2 VIEW COMPARISON:  Prior chest radiograph 05/24/2016 and earlier. FINDINGS: Heart size within normal limits. Aortic atherosclerosis. No appreciable airspace consolidation within the lungs. No frank pulmonary edema. No evidence of pleural effusion or pneumothorax. No acute bony abnormality identified. Thoracic spondylosis. IMPRESSION: No evidence of acute cardiopulmonary abnormality. Aortic Atherosclerosis (ICD10-I70.0). Thoracic spondylosis. Electronically Signed   By: Kellie Simmering DO   On: 07/30/2019 14:37   US Venous Img Lower Bilateral  Result Date: 07/30/2019 CLINICAL DATA:  Leg swelling EXAM: BILATERAL LOWER EXTREMITY VENOUS DOPPLER ULTRASOUND TECHNIQUE: Gray-scale sonography with compression, as well as color and duplex ultrasound, were performed to evaluate the deep venous system(s) from the level of the common femoral vein through the popliteal and proximal calf veins. COMPARISON:  None. FINDINGS: VENOUS Normal compressibility of the common femoral, superficial femoral, and popliteal veins, as well as the visualized calf veins. Visualized portions of profunda femoral vein and great saphenous vein unremarkable. No filling defects to suggest DVT on grayscale or color Doppler imaging. Doppler waveforms show normal direction of venous flow, normal respiratory plasticity and response to augmentation. OTHER None. Limitations: none IMPRESSION: Negative. Electronically Signed   By: Rolm Baptise M.D.   On: 07/30/2019 18:13    No results found.  No results found.    Assessment and Plan: Patient Active Problem List   Diagnosis Date Noted  .  Morbid obesity ([place]) 08/21/2016  . Primary osteoarthritis of right knee 07/01/2014    1. Obstructive sleep apnea patient is doing better with the CPAP device she will continue to use the CPAP as ordered.  I have encouraged her to increase the time on the CPAP so that she can do maximal benefit she states that she will try to do so 2. Morbid obesity diet exercise weight loss is strongly recommended.  She has a BMI of 48.64 3. CPAP counseling provided today.  General Counseling: I have discussed the findings of the evaluation and examination with Danitza.  I have also discussed any further diagnostic evaluation thatmay be needed or ordered today. Eurika verbalizes understanding of the findings of todays visit. We also reviewed her medications today and discussed drug interactions and side effects including but not limited excessive drowsiness and altered mental states. We also discussed that there is always a risk not just to her but also people around her. she has been encouraged to call the office with any questions or concerns that should arise related to todays visit.  Orders Placed This Encounter  Procedures  . Flu Vaccine MDCK QUAD PF     Time spent: 34  I have personally obtained a history, examined the patient, evaluated laboratory  and imaging results, formulated the assessment and plan and placed orders.    Allyne Gee, MD [facility] Pulmonary and Critical Care Sleep medicine

## 2019-12-18 NOTE — Patient Instructions (Signed)

## 2019-12-24 ENCOUNTER — Ambulatory Visit: Payer: Medicaid Other | Admitting: Internal Medicine

## 2019-12-28 ENCOUNTER — Other Ambulatory Visit: Payer: Self-pay | Admitting: Internal Medicine

## 2019-12-30 ENCOUNTER — Encounter: Payer: Self-pay | Admitting: Internal Medicine

## 2019-12-30 ENCOUNTER — Ambulatory Visit: Payer: Medicaid Other | Admitting: Internal Medicine

## 2019-12-30 ENCOUNTER — Other Ambulatory Visit: Payer: Self-pay

## 2019-12-30 VITALS — BP 148/80 | HR 80 | Temp 98.2°F | Resp 16 | Ht 63.0 in | Wt 276.4 lb

## 2019-12-30 DIAGNOSIS — I10 Essential (primary) hypertension: Secondary | ICD-10-CM | POA: Diagnosis not present

## 2019-12-30 DIAGNOSIS — J4540 Moderate persistent asthma, uncomplicated: Secondary | ICD-10-CM | POA: Diagnosis not present

## 2019-12-30 DIAGNOSIS — H60593 Other noninfective acute otitis externa, bilateral: Secondary | ICD-10-CM

## 2019-12-30 DIAGNOSIS — Z9989 Dependence on other enabling machines and devices: Secondary | ICD-10-CM

## 2019-12-30 DIAGNOSIS — I5032 Chronic diastolic (congestive) heart failure: Secondary | ICD-10-CM

## 2019-12-30 DIAGNOSIS — E039 Hypothyroidism, unspecified: Secondary | ICD-10-CM

## 2019-12-30 DIAGNOSIS — G4733 Obstructive sleep apnea (adult) (pediatric): Secondary | ICD-10-CM

## 2019-12-30 MED ORDER — NEOMYCIN-POLYMYXIN-HC 3.5-10000-1 OT SOLN: OTIC | 0 refills | Status: AC

## 2019-12-30 MED ORDER — FUROSEMIDE 40 MG PO TABS: ORAL_TABLET | ORAL | 3 refills | Status: DC

## 2019-12-30 NOTE — Progress Notes (Signed)
[facility] [place], [place] [address]  Internal MEDICINE  Office Visit Note  Patient Name: Brianna Gray  [id number]  [id number]  Date of Service: 01/05/2020  Chief Complaint  Patient presents with  . Follow-up    4 week     HPI Pt is here with her grand aughter. She has multiple medical problems 1. Recent OSA on CPAP, admits not using it all night since she has to take care of her husband at night who suffers from dementia. 2. HTN better controlled, BLE present. Does not wear and pressure stockings . 3. Weight gain. Does monitor her calories  4. Hypothyroidism  5. Chronic knee arthritis  6. C/o itching in both ears. May be drainage   Current Medication: Outpatient Encounter Medications as of 12/30/2019  Medication Sig  . acetic acid-hydrocortisone (VOSOL-HC) OTIC solution Place 3 drops into both ears 2 (two) times daily. As needed for itching  . ADVAIR DISKUS 250-50 MCG/DOSE AEPB INHALE 1 PUFF INTO THE LUNGS 2 (TWO) TIMES DAILY AS NEEDED. FOR RESPIRATORY ISSUES.  Marland Kitchen atorvastatin (LIPITOR) 10 MG tablet TAKE 1 TABLET BY MOUTH EVERYDAY AT BEDTIME  . esomeprazole (NEXIUM) 40 MG capsule TAKE 1 CAPSULE BY MOUTH EVERY DAY FOR STOMACH  . fluticasone (FLONASE) 50 MCG/ACT nasal spray Place 1-2 sprays into both nostrils daily as needed. For nasal congestion.  . furosemide (LASIX) 40 MG tablet Take one and half tab a day  . hydrALAZINE (APRESOLINE) 25 MG tablet TAKE 1 TABLET BY MOUTH THREE TIMES A DAY  . ipratropium-albuterol (DUONEB) 0.5-2.5 (3) MG/3ML SOLN One vial tid prn for asthma, faillure to MDI  . levothyroxine (SYNTHROID) 88 MCG tablet TAKE 1 TABLET BY MOUTH EVERY MORNING ON EMPTY STOMACH**OK TO CHANGE TO LANNETT PER MD**  . loratadine (CLARITIN) 10 MG tablet TAKE 1 TABLET BY MOUTH EVERY DAY  . losartan (COZAAR) 100 MG tablet TAKE 1 TABLET BY MOUTH EVERY DAY  . meloxicam (MOBIC) 7.5 MG tablet TAKE 1 TABLET BY MOUTH TWICE A DAY FOR LOWER BACK AND JOINT  PAINS  . montelukast (SINGULAIR) 10 MG tablet Take 1 tablet (10 mg total) by mouth at bedtime. For sinus congestion and allergies  . Potassium 99 MG TABS Take 99 mg by mouth daily at 12 noon.  . vitamin B-12 (CYANOCOBALAMIN) 1000 MCG tablet Take 1,000 mcg by mouth daily at 12 noon.  . [DISCONTINUED] furosemide (LASIX) 40 MG tablet Take 1 tablet (40 mg total) by mouth daily.  Marland Kitchen neomycin-polymyxin-hydrocortisone (CORTISPORIN) OTIC solution 3-4 drops in both ears bid prn   No facility-administered encounter medications on file as of 12/30/2019.    Surgical History: Past Surgical History:  Procedure Laterality Date  . CHOLECYSTECTOMY    . HERNIA REPAIR    . HERNIA REPAIR    . HYSTEROSCOPY WITH D & C N/A 03/19/2015   Procedure: DILATATION AND CURETTAGE /HYSTEROSCOPY and polypectomy;  Surgeon: Honor Loh Ward, MD;  Location: [facility] ORS;  Service: Gynecology;  Laterality: N/A;  . LAPAROSCOPIC HYSTERECTOMY N/A 06/08/2016   Procedure: HYSTERECTOMY TOTAL LAPAROSCOPIC;  Surgeon: Gae Dry, MD;  Location: [facility] ORS;  Service: Gynecology;  Laterality: N/A;  . LAPAROSCOPIC SALPINGO OOPHERECTOMY Bilateral 06/08/2016   Procedure: LAPAROSCOPIC SALPINGO OOPHORECTOMY;  Surgeon: Gae Dry, MD;  Location: [facility] ORS;  Service: Gynecology;  Laterality: Bilateral;  . LAPAROTOMY N/A 06/08/2016   Procedure: LAPAROTOMY;  Surgeon: Gae Dry, MD;  Location: [facility] ORS;  Service: Gynecology;  Laterality: N/A;    Medical History: Past  Medical History:  Diagnosis Date  . Asthma   . Cardiomyopathy ([place])   . Carpal tunnel syndrome   . Carpal tunnel syndrome   . Dizziness   . Edema   . Elevated lipids   . GERD (gastroesophageal reflux disease)   . Hypertension   . Lower extremity edema   . Seasonal allergies   . Sleep apnea   . Wheezing     Family History: Family History  Problem Relation Age of Onset  . Diabetes Brother   . Hypertension Brother   . Breast cancer Neg Hx     Social History    Socioeconomic History  . Marital status: Married    Spouse name: Not on file  . Number of children: Not on file  . Years of education: Not on file  . Highest education level: Not on file  Occupational History  . Not on file  Tobacco Use  . Smoking status: Never Smoker  . Smokeless tobacco: Current User    Types: Chew  Substance and Sexual Activity  . Alcohol use: No  . Drug use: No  . Sexual activity: Never  Other Topics Concern  . Not on file  Social History Narrative   ** Merged History Encounter **       Social Determinants of Health   Financial Resource Strain:   . Difficulty of Paying Living Expenses: Not on file  Food Insecurity:   . Worried About Charity fundraiser in the Last Year: Not on file  . Ran Out of Food in the Last Year: Not on file  Transportation Needs:   . Lack of Transportation (Medical): Not on file  . Lack of Transportation (Non-Medical): Not on file  Physical Activity:   . Days of Exercise per Week: Not on file  . Minutes of Exercise per Session: Not on file  Stress:   . Feeling of Stress : Not on file  Social Connections:   . Frequency of Communication with Friends and Family: Not on file  . Frequency of Social Gatherings with Friends and Family: Not on file  . Attends Religious Services: Not on file  . Active Member of Clubs or Organizations: Not on file  . Attends Archivist Meetings: Not on file  . Marital Status: Not on file  Intimate Partner Violence:   . Fear of Current or Ex-Partner: Not on file  . Emotionally Abused: Not on file  . Physically Abused: Not on file  . Sexually Abused: Not on file    Review of Systems  Constitutional: Negative for chills, diaphoresis and fatigue.  HENT: Positive for ear discharge. Negative for ear pain, postnasal drip and sinus pressure.   Eyes: Negative for photophobia, discharge, redness, itching and visual disturbance.  Respiratory: Negative for cough, shortness of breath and  wheezing.   Cardiovascular: Negative for chest pain, palpitations and leg swelling.  Gastrointestinal: Negative for abdominal pain, constipation, diarrhea, nausea and vomiting.  Genitourinary: Negative for dysuria and flank pain.  Musculoskeletal: Positive for gait problem and joint swelling. Negative for arthralgias, back pain and neck pain.  Skin: Negative for color change.  Allergic/Immunologic: Negative for environmental allergies and food allergies.  Neurological: Negative for dizziness and headaches.  Hematological: Does not bruise/bleed easily.  Psychiatric/Behavioral: Negative for agitation, behavioral problems (depression) and hallucinations.    Vital Signs: BP (!) 148/80   Pulse 80   Temp 98.2 F (36.8 C)   Resp 16   Ht 5\' 3"  (1.6 m)  Wt 276 lb 6.4 oz (125.4 kg)   SpO2 98%   BMI 48.96 kg/m    Physical Exam Constitutional:      General: She is not in acute distress.    Appearance: She is well-developed. She is not diaphoretic.  HENT:     Head: Normocephalic and atraumatic.     Ears:     Comments: inflamed auditory canals     Mouth/Throat:     Pharynx: No oropharyngeal exudate.  Eyes:     Pupils: Pupils are equal, round, and reactive to light.  Neck:     Thyroid: No thyromegaly.     Vascular: No JVD.     Trachea: No tracheal deviation.  Cardiovascular:     Rate and Rhythm: Normal rate and regular rhythm.     Heart sounds: Normal heart sounds. No murmur heard.  No friction rub. No gallop.   Pulmonary:     Effort: Pulmonary effort is normal. No respiratory distress.     Breath sounds: No wheezing or rales.  Chest:     Chest wall: No tenderness.  Abdominal:     General: Bowel sounds are normal.     Palpations: Abdomen is soft.  Musculoskeletal:        General: Normal range of motion.     Cervical back: Normal range of motion and neck supple.     Right lower leg: Edema present.     Left lower leg: Edema present.  Lymphadenopathy:     Cervical: No  cervical adenopathy.  Skin:    General: Skin is warm and dry.  Neurological:     Mental Status: She is alert and oriented to person, place, and time.     Cranial Nerves: No cranial nerve deficit.  Psychiatric:        Behavior: Behavior normal.        Thought Content: Thought content normal.        Judgment: Judgment normal.    Assessment/Plan: 1. Acute irritant otitis externa, bilateral Avoid use of any oils, component of atopic dermatitis  - neomycin-polymyxin-hydrocortisone (CORTISPORIN) OTIC solution; 3-4 drops in both ears bid prn  Dispense: 10 mL; Refill: 0  2. Chronic diastolic heart failure (HCC) Will increase Lasix to 60 mg po qd for now, encouraged her to wear support stockings  - furosemide (LASIX) 40 MG tablet; Take one and half tab a day  Dispense: 145 tablet; Refill: 3 - Basic Metabolic Panel (BMET)  3. Moderate persistent asthma without complication Controlled   4. Uncontrolled hypertension Continue on all meds as before - Basic Metabolic Panel (BMET)  5. OSA on CPAP Encouraged her to use her CPAP as much as possible   6. Hypothyroidism, unspecified type Continue Synthroid s before  - TSH + free T4   General Counseling: Hayzlee verbalizes understanding of the findings of todays visit and agrees with plan of treatment. I have discussed any further diagnostic evaluation that may be needed or ordered today. We also reviewed her medications today. she has been encouraged to call the office with any questions or concerns that should arise related to todays visit.  Orders Placed This Encounter  Procedures  . Basic Metabolic Panel (BMET)  . TSH + free T4    Meds ordered this encounter  Medications  . neomycin-polymyxin-hydrocortisone (CORTISPORIN) OTIC solution    Sig: 3-4 drops in both ears bid prn    Dispense:  10 mL    Refill:  0  . furosemide (LASIX) 40 MG tablet  Sig: Take one and half tab a day    Dispense:  145 tablet    Refill:  3    Total time  spent: 35 Minutes Time spent includes review of chart, medications, test results, and follow up plan with the patient.      Dr Lavera Guise Internal medicine

## 2020-01-09 LAB — BASIC METABOLIC PANEL
BUN/Creatinine Ratio: 16 (ref 12–28)
BUN: 15 mg/dL (ref 8–27)
CO2: 28 mmol/L (ref 20–29)
Calcium: 9.8 mg/dL (ref 8.7–10.3)
Chloride: 101 mmol/L (ref 96–106)
Creatinine, Ser: 0.93 mg/dL (ref 0.57–1.00)
GFR calc Af Amer: 72 mL/min/{1.73_m2} (ref >59)
GFR calc non Af Amer: 62 mL/min/{1.73_m2} (ref >59)
Glucose: 114 mg/dL — ABNORMAL HIGH (ref 65–99)
Potassium: 4.2 mmol/L (ref 3.5–5.2)
Sodium: 143 mmol/L (ref 134–144)

## 2020-01-09 LAB — TSH+FREE T4
Free T4: 1.07 ng/dL (ref 0.82–1.77)
TSH: 2.32 u[IU]/mL (ref 0.450–4.500)

## 2020-01-11 ENCOUNTER — Other Ambulatory Visit: Payer: Self-pay | Admitting: Internal Medicine

## 2020-01-25 ENCOUNTER — Other Ambulatory Visit: Payer: Self-pay | Admitting: Internal Medicine

## 2020-02-05 ENCOUNTER — Telehealth: Payer: Self-pay

## 2020-02-05 NOTE — Telephone Encounter (Signed)
Patient has failed her cpap compliance after set up and will need to start using it or return machine or she will start getting bills from DME and or insurance due to non compliance on wearing it, [facility] has tried to advise pt. Brianna Gray

## 2020-02-07 ENCOUNTER — Other Ambulatory Visit: Payer: Self-pay | Admitting: Internal Medicine

## 2020-02-07 DIAGNOSIS — J45909 Unspecified asthma, uncomplicated: Secondary | ICD-10-CM

## 2020-02-24 ENCOUNTER — Other Ambulatory Visit: Payer: Self-pay | Admitting: Internal Medicine

## 2020-02-24 DIAGNOSIS — U071 COVID-19: Secondary | ICD-10-CM

## 2020-02-24 NOTE — Progress Notes (Signed)
CXR ordered 

## 2020-02-25 ENCOUNTER — Ambulatory Visit
Admission: RE | Admit: 2020-02-25 | Discharge: 2020-02-25 | Disposition: A | Discharge status: Home / Self Care | Payer: Medicaid Other | Source: Ambulatory Visit | Attending: Internal Medicine | Admitting: Internal Medicine

## 2020-02-25 ENCOUNTER — Ambulatory Visit: Payer: Medicaid Other

## 2020-02-25 ENCOUNTER — Other Ambulatory Visit: Payer: Self-pay | Admitting: Internal Medicine

## 2020-02-25 ENCOUNTER — Ambulatory Visit
Admission: RE | Admit: 2020-02-25 | Discharge: 2020-02-25 | Disposition: A | Discharge status: Home / Self Care | Payer: Medicaid Other | Attending: Internal Medicine | Admitting: Internal Medicine

## 2020-02-25 ENCOUNTER — Other Ambulatory Visit: Payer: Self-pay

## 2020-02-25 DIAGNOSIS — U071 COVID-19: Secondary | ICD-10-CM | POA: Insufficient documentation

## 2020-02-25 MED ORDER — AMOXICILLIN-POT CLAVULANATE 875-125 MG PO TABS: 1.0000 | ORAL_TABLET | Freq: Two times a day (BID) | ORAL | 0 refills | Status: DC

## 2020-02-28 ENCOUNTER — Other Ambulatory Visit: Payer: Self-pay | Admitting: Internal Medicine

## 2020-03-10 DIAGNOSIS — R600 Localized edema: Secondary | ICD-10-CM | POA: Insufficient documentation

## 2020-03-30 ENCOUNTER — Ambulatory Visit: Payer: Medicaid Other | Admitting: Internal Medicine

## 2020-04-07 ENCOUNTER — Other Ambulatory Visit: Payer: Self-pay | Admitting: Internal Medicine

## 2020-04-13 ENCOUNTER — Other Ambulatory Visit: Payer: Self-pay

## 2020-04-13 MED ORDER — ATORVASTATIN CALCIUM 10 MG PO TABS: ORAL_TABLET | ORAL | 5 refills | Status: DC

## 2020-05-04 ENCOUNTER — Ambulatory Visit (INDEPENDENT_AMBULATORY_CARE_PROVIDER_SITE_OTHER): Payer: Medicaid Other | Admitting: Internal Medicine

## 2020-05-04 ENCOUNTER — Other Ambulatory Visit: Payer: Self-pay

## 2020-05-04 ENCOUNTER — Encounter: Payer: Self-pay | Admitting: Internal Medicine

## 2020-05-04 VITALS — BP 134/74 | HR 60 | Temp 98.1°F | Resp 16 | Ht 63.0 in | Wt 269.0 lb

## 2020-05-04 DIAGNOSIS — L308 Other specified dermatitis: Secondary | ICD-10-CM

## 2020-05-04 DIAGNOSIS — N63 Unspecified lump in unspecified breast: Secondary | ICD-10-CM

## 2020-05-04 DIAGNOSIS — J45909 Unspecified asthma, uncomplicated: Secondary | ICD-10-CM

## 2020-05-04 DIAGNOSIS — I5032 Chronic diastolic (congestive) heart failure: Secondary | ICD-10-CM | POA: Diagnosis not present

## 2020-05-04 DIAGNOSIS — E782 Mixed hyperlipidemia: Secondary | ICD-10-CM

## 2020-05-04 DIAGNOSIS — G5603 Carpal tunnel syndrome, bilateral upper limbs: Secondary | ICD-10-CM

## 2020-05-04 MED ORDER — ATORVASTATIN CALCIUM 10 MG PO TABS: ORAL_TABLET | ORAL | 5 refills | Status: DC

## 2020-05-04 MED ORDER — ADVAIR DISKUS 250-50 MCG/DOSE IN AEPB: 1.0000 | INHALATION_SPRAY | Freq: Two times a day (BID) | RESPIRATORY_TRACT | 12 refills | Status: DC | PRN

## 2020-05-04 MED ORDER — BETAMETHASONE DIPROPIONATE AUG 0.05 % EX OINT: TOPICAL_OINTMENT | Freq: Two times a day (BID) | CUTANEOUS | 0 refills | Status: DC

## 2020-05-04 NOTE — Progress Notes (Signed)
[facility] [place], [place] [address]  Internal MEDICINE  Office Visit Note  Patient Name: Brianna Gray  [id number]  [id number]  Date of Service: 05/11/2020  Chief Complaint  Patient presents with  . cyst under left arm    Opened up 1 month ago, still painful    HPI Pt is here for routine follow up, at her baseline, has been taking care of her ailing husband.She was not using her CPAP machine however will try to use more. She did notice a lump in her left armpit, denies any pain, fever or chills, sees cardiology for hypertension and cardiomegaly. bilateral knee pain due to wt. C/o bilateral wrist pain and numbness, has seen ortho ?? Carpal tunnel  C/o rash, itching around ankles  Takes all medications as prescribed.    Current Medication: Outpatient Encounter Medications as of 05/04/2020  Medication Sig  . augmented betamethasone dipropionate (DIPROLENE) 0.05 % ointment Apply topically 2 (two) times daily.  Marland Kitchen acetic acid-hydrocortisone (VOSOL-HC) OTIC solution Place 3 drops into both ears 2 (two) times daily. As needed for itching  . ADVAIR DISKUS 250-50 MCG/DOSE AEPB Inhale 1 puff into the lungs 2 (two) times daily as needed. For respiratory issues.  Marland Kitchen atorvastatin (LIPITOR) 10 MG tablet TAKE 1 TABLET BY MOUTH EVERYDAY AT BEDTIME  . esomeprazole (NEXIUM) 40 MG capsule TAKE 1 CAPSULE BY MOUTH EVERY DAY FOR STOMACH  . fluticasone (FLONASE) 50 MCG/ACT nasal spray Place 1-2 sprays into both nostrils daily as needed. For nasal congestion.  . furosemide (LASIX) 40 MG tablet Take one and half tab a day  . ipratropium-albuterol (DUONEB) 0.5-2.5 (3) MG/3ML SOLN One vial tid prn for asthma, faillure to MDI  . levothyroxine (SYNTHROID) 88 MCG tablet TAKE 1 TABLET BY MOUTH EVERY MORNING ON EMPTY STOMACH**OK TO CHANGE TO LANNETT PER MD**  . loratadine (CLARITIN) 10 MG tablet TAKE 1 TABLET BY MOUTH EVERY DAY  . losartan (COZAAR) 100 MG tablet TAKE 1 TABLET BY MOUTH  EVERY DAY  . montelukast (SINGULAIR) 10 MG tablet Take 1 tablet (10 mg total) by mouth at bedtime. For sinus congestion and allergies  . neomycin-polymyxin-hydrocortisone (CORTISPORIN) OTIC solution 3-4 drops in both ears bid prn  . Potassium 99 MG TABS Take 99 mg by mouth daily at 12 noon.  . vitamin B-12 (CYANOCOBALAMIN) 1000 MCG tablet Take 1,000 mcg by mouth daily at 12 noon.  . [DISCONTINUED] ADVAIR DISKUS 250-50 MCG/DOSE AEPB INHALE 1 PUFF INTO THE LUNGS 2 (TWO) TIMES DAILY AS NEEDED. FOR RESPIRATORY ISSUES.  . [DISCONTINUED] amoxicillin-clavulanate (AUGMENTIN) 875-125 MG tablet Take 1 tablet by mouth 2 (two) times daily. For abscess (Patient not taking: Reported on 05/04/2020)  . [DISCONTINUED] atorvastatin (LIPITOR) 10 MG tablet TAKE 1 TABLET BY MOUTH EVERYDAY AT BEDTIME  . [DISCONTINUED] hydrALAZINE (APRESOLINE) 25 MG tablet TAKE 1 TABLET BY MOUTH THREE TIMES A DAY  . [DISCONTINUED] meloxicam (MOBIC) 7.5 MG tablet TAKE 1 TABLET BY MOUTH TWICE A DAY FOR LOWER BACK AND JOINT PAINS   No facility-administered encounter medications on file as of 05/04/2020.    Surgical History: Past Surgical History:  Procedure Laterality Date  . CHOLECYSTECTOMY    . HERNIA REPAIR    . HERNIA REPAIR    . HYSTEROSCOPY WITH D & C N/A 03/19/2015   Procedure: DILATATION AND CURETTAGE /HYSTEROSCOPY and polypectomy;  Surgeon: Honor Loh Ward, MD;  Location: [facility] OR[facility];  Service: Gynecology;  Laterality: N/A;  . LAPAROSCOPIC HYSTERECTOMY N/A 06/08/2016   Procedure: HYSTERECTOMY TOTAL  LAPAROSCOPIC;  Surgeon: Gae Dry, MD;  Location: [facility] OR[facility];  Service: Gynecology;  Laterality: N/A;  . LAPAROSCOPIC SALPINGO OOPHERECTOMY Bilateral 06/08/2016   Procedure: LAPAROSCOPIC SALPINGO OOPHORECTOMY;  Surgeon: Gae Dry, MD;  Location: [facility];  Service: Gynecology;  Laterality: Bilateral;  . LAPAROTOMY N/A 06/08/2016   Procedure: LAPAROTOMY;  Surgeon: Gae Dry, MD;  Location: [facility];  Service: Gynecology;   Laterality: N/A;    Medical History: Past Medical History:  Diagnosis Date  . Asthma   . Cardiomyopathy ([place])   . Carpal tunnel syndrome   . Carpal tunnel syndrome   . Dizziness   . Edema   . Elevated lipids   . GERD (gastroesophageal reflux disease)   . Hypertension   . Lower extremity edema   . Seasonal allergies   . Sleep apnea   . Wheezing     Family History: Family History  Problem Relation Age of Onset  . Diabetes Brother   . Hypertension Brother   . Breast cancer Neg Hx     Social History   Socioeconomic History  . Marital status: Married    Spouse name: Not on file  . Number of children: Not on file  . Years of education: Not on file  . Highest education level: Not on file  Occupational History  . Not on file  Tobacco Use  . Smoking status: Never Smoker  . Smokeless tobacco: Current User    Types: Chew  Substance and Sexual Activity  . Alcohol use: No  . Drug use: No  . Sexual activity: Never  Other Topics Concern  . Not on file  Social History Narrative   ** Merged History Encounter **       Social Determinants of Health   Financial Resource Strain: Not on file  Food Insecurity: Not on file  Transportation Needs: Not on file  Physical Activity: Not on file  Stress: Not on file  Social Connections: Not on file  Intimate Partner Violence: Not on file      Review of Systems  Constitutional: Negative for chills, diaphoresis and fatigue.  HENT: Negative for ear pain, postnasal drip and sinus pressure.   Eyes: Negative for photophobia, discharge, redness, itching and visual disturbance.  Respiratory: Negative for cough, shortness of breath and wheezing.   Cardiovascular: Negative for chest pain, palpitations and leg swelling.  Gastrointestinal: Negative for abdominal pain, constipation, diarrhea, nausea and vomiting.  Genitourinary: Negative for dysuria and flank pain.  Musculoskeletal: Negative for arthralgias, back pain, gait problem and  neck pain.  Skin: Positive for rash. Negative for color change.  Allergic/Immunologic: Negative for environmental allergies and food allergies.  Neurological: Positive for numbness. Negative for dizziness and headaches.  Hematological: Does not bruise/bleed easily.  Psychiatric/Behavioral: Negative for agitation, behavioral problems (depression) and hallucinations.    Vital Signs: BP 134/74   Pulse 60   Temp 98.1 F (36.7 C)   Resp 16   Ht 5\' 3"  (1.6 m)   Wt 269 lb (122 kg)   SpO2 96%   BMI 47.65 kg/m    Physical Exam Constitutional:      General: She is not in acute distress.    Appearance: She is well-developed. She is not diaphoretic.  HENT:     Head: Normocephalic and atraumatic.     Mouth/Throat:     Pharynx: No oropharyngeal exudate.  Eyes:     Pupils: Pupils are equal, round, and reactive to light.  Neck:  Thyroid: No thyromegaly.     Vascular: No JVD.     Trachea: No tracheal deviation.  Cardiovascular:     Rate and Rhythm: Normal rate and regular rhythm.     Heart sounds: Normal heart sounds. No murmur heard. No friction rub. No gallop.   Pulmonary:     Effort: Pulmonary effort is normal. No respiratory distress.     Breath sounds: No wheezing or rales.  Chest:     Chest wall: No tenderness.  Breasts:     Left: Mass present.      Comments: Left axilla // breast tail has lump, movable  Abdominal:     General: Bowel sounds are normal.     Palpations: Abdomen is soft.  Musculoskeletal:        General: Normal range of motion.     Cervical back: Normal range of motion and neck supple.  Lymphadenopathy:     Cervical: No cervical adenopathy.  Skin:    General: Skin is warm and dry.     Findings: Erythema and rash present.     Comments: Dyshydrosis   Neurological:     Mental Status: She is alert and oriented to person, place, and time.     Cranial Nerves: No cranial nerve deficit.  Psychiatric:        Behavior: Behavior normal.        Thought  Content: Thought content normal.        Judgment: Judgment normal.        Assessment/Plan: 1. Lump in axillary tail of breast Mass is movable, lymphnode?/ will order diagnostics  - US Soft Tissue Head/Neck (NON-THYROID); Future - US BREAST LTD UNI RIGHT INC AXILLA - US BREAST LTD UNI LEFT INC AXILLA - MM DIAG BREAST TOMO BILATERAL - US Soft Tissue Head/Neck (NON-THYROID)  2. Moderate asthma, unspecified whether complicated, unspecified whether persistent Controlled with advair  - ADVAIR DISKUS 250-50 MCG/DOSE AEPB; Inhale 1 puff into the lungs 2 (two) times daily as needed. For respiratory issues.  Dispense: 60 each; Refill: 12  3. Chronic diastolic heart failure ([place]) Followed by cardiology, encouraged pt to use her CPAP   4. Morbid obesity (HCC) Obesity Counseling: Risk Assessment: An assessment of behavioral risk factors was made today and includes lack of exercise sedentary lifestyle, lack of portion control and poor dietary habits.  Risk Modification Advice: She was counseled on portion control guidelines. Restricting daily caloric intake to 1500 The detrimental long term effects of obesity on her health and ongoing poor compliance was also discussed with the patient.  5. Carpal tunnel syndrome, bilateral Try wrist splint for left side  if helps then will prescribe for the other wrist  - Wrist splint  6. Other eczema Excessive drying of the skin, will need to use good Mositurizing cream along with topical steroids   - augmented betamethasone dipropionate (DIPROLENE) 0.05 % ointment; Apply topically 2 (two) times daily.  Dispense: 30 g; Refill: 0  7. Mixed hyperlipidemia Continue Lipitor as before   General Counseling: Rolando verbalizes understanding of the findings of todays visit and agrees with plan of treatment. I have discussed any further diagnostic evaluation that may be needed or ordered today. We also reviewed her medications today. she has been encouraged to call  the office with any questions or concerns that should arise related to todays visit.    Orders Placed This Encounter  Procedures  . Wrist splint  . US Soft Tissue Head/Neck (NON-THYROID)    Meds ordered  this encounter  Medications  . augmented betamethasone dipropionate (DIPROLENE) 0.05 % ointment    Sig: Apply topically 2 (two) times daily.    Dispense:  30 g    Refill:  0  . atorvastatin (LIPITOR) 10 MG tablet    Sig: TAKE 1 TABLET BY MOUTH EVERYDAY AT BEDTIME    Dispense:  90 tablet    Refill:  5  . ADVAIR DISKUS 250-50 MCG/DOSE AEPB    Sig: Inhale 1 puff into the lungs 2 (two) times daily as needed. For respiratory issues.    Dispense:  60 each    Refill:  12    Total time spent:35 Minutes Time spent includes review of chart, medications, test results, and follow up plan with the patient.   [place] Controlled Substance Database was reviewed by me.   Dr Lavera Guise Internal medicine

## 2020-05-09 ENCOUNTER — Other Ambulatory Visit: Payer: Self-pay | Admitting: Internal Medicine

## 2020-05-11 ENCOUNTER — Other Ambulatory Visit: Payer: Self-pay

## 2020-05-11 ENCOUNTER — Ambulatory Visit
Admission: RE | Admit: 2020-05-11 | Discharge: 2020-05-11 | Disposition: A | Discharge status: Home / Self Care | Payer: Medicaid Other | Source: Ambulatory Visit | Attending: Internal Medicine | Admitting: Internal Medicine

## 2020-05-11 ENCOUNTER — Ambulatory Visit: Admission: RE | Admit: 2020-05-11 | Payer: Medicaid Other | Source: Ambulatory Visit

## 2020-05-11 DIAGNOSIS — N63 Unspecified lump in unspecified breast: Secondary | ICD-10-CM | POA: Diagnosis not present

## 2020-05-11 NOTE — Progress Notes (Signed)
Normal U/S of left breast, please advise

## 2020-06-17 ENCOUNTER — Ambulatory Visit: Payer: Medicaid Other | Admitting: Internal Medicine

## 2020-07-14 ENCOUNTER — Other Ambulatory Visit: Payer: Self-pay

## 2020-07-14 ENCOUNTER — Encounter: Payer: Self-pay | Admitting: Nurse Practitioner

## 2020-07-14 ENCOUNTER — Other Ambulatory Visit: Payer: Self-pay | Admitting: Nurse Practitioner

## 2020-07-14 ENCOUNTER — Ambulatory Visit: Payer: Medicaid Other | Admitting: Nurse Practitioner

## 2020-07-14 DIAGNOSIS — R6889 Other general symptoms and signs: Secondary | ICD-10-CM

## 2020-07-14 DIAGNOSIS — J019 Acute sinusitis, unspecified: Secondary | ICD-10-CM

## 2020-07-14 LAB — POCT INFLUENZA A/B
Influenza A, POC: NEGATIVE
Influenza B, POC: NEGATIVE

## 2020-07-14 MED ORDER — AMOXICILLIN-POT CLAVULANATE 875-125 MG PO TABS: 1.0000 | ORAL_TABLET | Freq: Two times a day (BID) | ORAL | 0 refills | Status: DC

## 2020-07-14 NOTE — Progress Notes (Signed)
[facility] [place], [place] [address]  Internal MEDICINE  Office Visit Note  Patient Name: Brianna Gray  [id number]  [id number]  Date of Service: 07/15/2020  Chief Complaint  Patient presents with  . Sinusitis    Covid test at home is negative   . Cough  . Generalized Body Aches  . Conjunctivitis     HPI Pt is here for a sick visit and accompanied by her granddaughter who translates for the patient.  -negative home COVID test, negative flu swab in office. -symptoms started 2-3 days ago with chills, fatigue, nasal congestion, post nasal drip, sinus pain/pressure, sore throat, eye pain and itching, chest tightness, cough, SOB, wheezing, body aches, and headache.  She denies any fever, chest pain, runny nose, or sneezing.    Current Medication:  Outpatient Encounter Medications as of 07/14/2020  Medication Sig  . [DISCONTINUED] amoxicillin-clavulanate (AUGMENTIN) 875-125 MG tablet Take 1 tablet by mouth 2 (two) times daily.  Marland Kitchen acetic acid-hydrocortisone (VOSOL-HC) OTIC solution Place 3 drops into both ears 2 (two) times daily. As needed for itching  . ADVAIR DISKUS 250-50 MCG/DOSE AEPB Inhale 1 puff into the lungs 2 (two) times daily as needed. For respiratory issues.  Marland Kitchen atorvastatin (LIPITOR) 10 MG tablet TAKE 1 TABLET BY MOUTH EVERYDAY AT BEDTIME  . augmented betamethasone dipropionate (DIPROLENE) 0.05 % ointment Apply topically 2 (two) times daily.  Marland Kitchen esomeprazole (NEXIUM) 40 MG capsule TAKE 1 CAPSULE BY MOUTH EVERY DAY FOR STOMACH  . fluticasone (FLONASE) 50 MCG/ACT nasal spray Place 1-2 sprays into both nostrils daily as needed. For nasal congestion.  . furosemide (LASIX) 40 MG tablet Take one and half tab a day  . hydrALAZINE (APRESOLINE) 25 MG tablet TAKE 1 TABLET BY MOUTH THREE TIMES A DAY  . ipratropium-albuterol (DUONEB) 0.5-2.5 (3) MG/3ML SOLN One vial tid prn for asthma, faillure to MDI  . levothyroxine (SYNTHROID) 88 MCG tablet TAKE 1  TABLET BY MOUTH EVERY MORNING ON EMPTY STOMACH**OK TO CHANGE TO LANNETT PER MD**  . loratadine (CLARITIN) 10 MG tablet TAKE 1 TABLET BY MOUTH EVERY DAY  . losartan (COZAAR) 100 MG tablet TAKE 1 TABLET BY MOUTH EVERY DAY  . meloxicam (MOBIC) 7.5 MG tablet TAKE 1 TABLET BY MOUTH TWICE A DAY FOR LOWER BACK AND JOINT PAINS  . montelukast (SINGULAIR) 10 MG tablet Take 1 tablet (10 mg total) by mouth at bedtime. For sinus congestion and allergies  . neomycin-polymyxin-hydrocortisone (CORTISPORIN) OTIC solution 3-4 drops in both ears bid prn  . Potassium 99 MG TABS Take 99 mg by mouth daily at 12 noon.  . vitamin B-12 (CYANOCOBALAMIN) 1000 MCG tablet Take 1,000 mcg by mouth daily at 12 noon.   No facility-administered encounter medications on file as of 07/14/2020.      Medical History: Past Medical History:  Diagnosis Date  . Asthma   . Cardiomyopathy ([place])   . Carpal tunnel syndrome   . Carpal tunnel syndrome   . Dizziness   . Edema   . Elevated lipids   . GERD (gastroesophageal reflux disease)   . Hypertension   . Lower extremity edema   . Seasonal allergies   . Sleep apnea   . Wheezing      Vital Signs: BP (!) 100/54   Pulse 80   Temp 97.8 F (36.6 C)   Resp 16   Ht 5\' 3"  (1.6 m)   Wt 262 lb (118.8 kg)   SpO2 94%   BMI 46.41 kg/m  Review of Systems  Constitutional: Positive for chills and fatigue. Negative for fever.  HENT: Positive for congestion, postnasal drip, sinus pressure, sinus pain, sore throat and trouble swallowing. Negative for ear pain, mouth sores, rhinorrhea and sneezing.   Eyes: Positive for pain, discharge, itching and visual disturbance. Negative for redness.  Respiratory: Positive for cough, chest tightness, shortness of breath and wheezing.   Cardiovascular: Negative for chest pain.  Gastrointestinal: Negative for constipation, diarrhea, nausea and vomiting.  Genitourinary: Negative for flank pain.  Musculoskeletal: Positive for myalgias.   Neurological: Positive for headaches. Negative for dizziness.  Psychiatric/Behavioral: Negative.     Physical Exam Vitals reviewed.  HENT:     Head: Normocephalic and atraumatic.     Right Ear: Tympanic membrane, ear canal and external ear normal.     Left Ear: Tympanic membrane, ear canal and external ear normal.     Nose: Congestion present.     Mouth/Throat:     Mouth: Mucous membranes are moist.     Pharynx: Posterior oropharyngeal erythema present.  Eyes:     General:        Right eye: Discharge present.        Left eye: Discharge present.    Conjunctiva/sclera:     Right eye: Right conjunctiva is injected.     Left eye: Left conjunctiva is injected.     Pupils: Pupils are equal, round, and reactive to light.  Cardiovascular:     Rate and Rhythm: Normal rate and regular rhythm.     Pulses: Normal pulses.     Heart sounds: Normal heart sounds.  Pulmonary:     Effort: Pulmonary effort is normal.     Breath sounds: Normal breath sounds.  Musculoskeletal:     Cervical back: Normal range of motion and neck supple.  Skin:    General: Skin is warm and dry.     Capillary Refill: Capillary refill takes less than 2 seconds.  Neurological:     Mental Status: She is alert and oriented to person, place, and time.  Psychiatric:        Mood and Affect: Mood normal.        Behavior: Behavior normal.    Assessment/Plan:  1. Acute non-recurrent sinusitis, unspecified location augmentin prescribed for sinusitis, instructed patient to call the clinic if symptoms worsen or fail to improve.  2. Flu-like symptoms Tested for flu in clinic, test was negative, home COVID test was also negative.  - POCT Influenza A/B   General Counseling: Brianna Gray verbalizes understanding of the findings of todays visit and agrees with plan of treatment. I have discussed any further diagnostic evaluation that may be needed or ordered today. We also reviewed her medications today. she has been encouraged to  call the office with any questions or concerns that should arise related to todays visit.   Orders Placed This Encounter  Procedures  . POCT Influenza A/B    Meds ordered this encounter  Medications  . DISCONTD: amoxicillin-clavulanate (AUGMENTIN) 875-125 MG tablet    Sig: Take 1 tablet by mouth 2 (two) times daily.    Dispense:  14 tablet    Refill:  0   Return if symptoms worsen or fail to improve.  [place] Controlled Substance Database was reviewed by me.  Time spent:20 Minutes This patient was seen by Jonetta Osgood, FNP-C in collaboration with Dr. Clayborn Bigness as a part of collaborative care agreement.

## 2020-07-15 ENCOUNTER — Other Ambulatory Visit: Payer: Self-pay

## 2020-07-15 MED ORDER — AMOXICILLIN-POT CLAVULANATE 875-125 MG PO TABS
1.0000 | ORAL_TABLET | Freq: Two times a day (BID) | ORAL | 0 refills | Status: DC
Start: 2020-07-15 — End: 2021-02-18

## 2020-09-14 ENCOUNTER — Other Ambulatory Visit: Payer: Self-pay | Admitting: Internal Medicine

## 2020-10-07 ENCOUNTER — Other Ambulatory Visit: Payer: Self-pay

## 2020-10-07 ENCOUNTER — Encounter: Payer: Self-pay | Admitting: Nurse Practitioner

## 2020-10-07 ENCOUNTER — Ambulatory Visit: Payer: Medicaid Other | Admitting: Nurse Practitioner

## 2020-10-07 ENCOUNTER — Telehealth: Payer: Self-pay

## 2020-10-07 VITALS — BP 130/70 | HR 60 | Temp 97.8°F | Resp 16 | Ht 63.0 in | Wt 265.0 lb

## 2020-10-07 DIAGNOSIS — Z0001 Encounter for general adult medical examination with abnormal findings: Secondary | ICD-10-CM | POA: Diagnosis not present

## 2020-10-07 DIAGNOSIS — G4733 Obstructive sleep apnea (adult) (pediatric): Secondary | ICD-10-CM | POA: Diagnosis not present

## 2020-10-07 DIAGNOSIS — E039 Hypothyroidism, unspecified: Secondary | ICD-10-CM | POA: Diagnosis not present

## 2020-10-07 DIAGNOSIS — B351 Tinea unguium: Secondary | ICD-10-CM

## 2020-10-07 DIAGNOSIS — R5383 Other fatigue: Secondary | ICD-10-CM

## 2020-10-07 DIAGNOSIS — H538 Other visual disturbances: Secondary | ICD-10-CM

## 2020-10-07 DIAGNOSIS — I5032 Chronic diastolic (congestive) heart failure: Secondary | ICD-10-CM

## 2020-10-07 DIAGNOSIS — Z6841 Body Mass Index (BMI) 40.0 and over, adult: Secondary | ICD-10-CM

## 2020-10-07 DIAGNOSIS — R7301 Impaired fasting glucose: Secondary | ICD-10-CM

## 2020-10-07 DIAGNOSIS — D508 Other iron deficiency anemias: Secondary | ICD-10-CM

## 2020-10-07 DIAGNOSIS — E559 Vitamin D deficiency, unspecified: Secondary | ICD-10-CM

## 2020-10-07 DIAGNOSIS — E782 Mixed hyperlipidemia: Secondary | ICD-10-CM

## 2020-10-07 DIAGNOSIS — Z13820 Encounter for screening for osteoporosis: Secondary | ICD-10-CM

## 2020-10-07 DIAGNOSIS — G5603 Carpal tunnel syndrome, bilateral upper limbs: Secondary | ICD-10-CM

## 2020-10-07 DIAGNOSIS — Z9989 Dependence on other enabling machines and devices: Secondary | ICD-10-CM

## 2020-10-07 NOTE — Progress Notes (Signed)
[facility] [place], [place] [address]  Internal MEDICINE  Office Visit Note  Patient Name: Brianna Gray  [id number]  [id number]  Date of Service: 10/07/2020  Chief Complaint  Patient presents with   Annual Exam   Hypertension   Quality Metric Gaps    BMD    HPI Brianna Gray presents for an annual well visit and physical exam. she has a history of GERD, asthma, sleep apnea, CPAP use, hypertension, right carpal tunnel syndrome, hyperlipidemia, anemia, chronic diastolic heart failure, cholecystectomy, and hysterectomy. Mammogram was done in march 2022. Bone density scan is due. She has had 2 doses of the COVID vaccine and 1 booster dose -age appropriate screenings and immunizations as appropriate -COVID vacc status s doses -current problems, concerns.  Medication refills none  Routine labs Work and home life: retired Pain: sore not sleeping well staying up with husband who has dementia   Carpal tunnel syndrome.      Current Medication: Outpatient Encounter Medications as of 10/07/2020  Medication Sig   acetic acid-hydrocortisone (VOSOL-HC) OTIC solution Place 3 drops into both ears 2 (two) times daily. As needed for itching   ADVAIR DISKUS 250-50 MCG/DOSE AEPB Inhale 1 puff into the lungs 2 (two) times daily as needed. For respiratory issues.   amoxicillin-clavulanate (AUGMENTIN) 875-125 MG tablet Take 1 tablet by mouth 2 (two) times daily.   atorvastatin (LIPITOR) 10 MG tablet TAKE 1 TABLET BY MOUTH EVERYDAY AT BEDTIME   augmented betamethasone dipropionate (DIPROLENE) 0.05 % ointment Apply topically 2 (two) times daily.   esomeprazole (NEXIUM) 40 MG capsule TAKE 1 CAPSULE BY MOUTH EVERY DAY FOR STOMACH   fluticasone (FLONASE) 50 MCG/ACT nasal spray Place 1-2 sprays into both nostrils daily as needed. For nasal congestion.   hydrALAZINE (APRESOLINE) 25 MG tablet TAKE 1 TABLET BY MOUTH THREE TIMES A DAY   ipratropium-albuterol (DUONEB) 0.5-2.5 (3) MG/3ML  SOLN One vial tid prn for asthma, faillure to MDI   levothyroxine (SYNTHROID) 88 MCG tablet TAKE 1 TABLET BY MOUTH EVERY MORNING ON EMPTY STOMACH**OK TO CHANGE TO LANNETT PER MD**   loratadine (CLARITIN) 10 MG tablet TAKE 1 TABLET BY MOUTH EVERY DAY   losartan (COZAAR) 100 MG tablet TAKE 1 TABLET BY MOUTH EVERY DAY   meloxicam (MOBIC) 7.5 MG tablet TAKE 1 TABLET BY MOUTH TWICE A DAY FOR LOWER BACK AND JOINT PAINS   montelukast (SINGULAIR) 10 MG tablet Take 1 tablet (10 mg total) by mouth at bedtime. For sinus congestion and allergies   neomycin-polymyxin-hydrocortisone (CORTISPORIN) OTIC solution 3-4 drops in both ears bid prn   Potassium 99 MG TABS Take 99 mg by mouth daily at 12 noon.   vitamin B-12 (CYANOCOBALAMIN) 1000 MCG tablet Take 1,000 mcg by mouth daily at 12 noon.   [DISCONTINUED] furosemide (LASIX) 40 MG tablet Take one and half tab a day   No facility-administered encounter medications on file as of 10/07/2020.    Surgical History: Past Surgical History:  Procedure Laterality Date   CHOLECYSTECTOMY     HERNIA REPAIR     HERNIA REPAIR     HYSTEROSCOPY WITH D & C N/A 03/19/2015   Procedure: DILATATION AND CURETTAGE /HYSTEROSCOPY and polypectomy;  Surgeon: Honor Loh Ward, MD;  Location: [facility];  Service: Gynecology;  Laterality: N/A;   LAPAROSCOPIC HYSTERECTOMY N/A 06/08/2016   Procedure: HYSTERECTOMY TOTAL LAPAROSCOPIC;  Surgeon: Gae Dry, MD;  Location: [facility];  Service: Gynecology;  Laterality: N/A;   LAPAROSCOPIC SALPINGO OOPHERECTOMY Bilateral 06/08/2016  Procedure: LAPAROSCOPIC SALPINGO OOPHORECTOMY;  Surgeon: Gae Dry, MD;  Location: [facility];  Service: Gynecology;  Laterality: Bilateral;   LAPAROTOMY N/A 06/08/2016   Procedure: LAPAROTOMY;  Surgeon: Gae Dry, MD;  Location: [facility];  Service: Gynecology;  Laterality: N/A;    Medical History: Past Medical History:  Diagnosis Date   Asthma    Cardiomyopathy ([place])    Carpal tunnel syndrome     Carpal tunnel syndrome    Dizziness    Edema    Elevated lipids    GERD (gastroesophageal reflux disease)    Hypertension    Lower extremity edema    Seasonal allergies    Sleep apnea    Wheezing     Family History: Family History  Problem Relation Age of Onset   Diabetes Brother    Hypertension Brother    Breast cancer Neg Hx     Social History   Socioeconomic History   Marital status: Married    Spouse name: Not on file   Number of children: Not on file   Years of education: Not on file   Highest education level: Not on file  Occupational History   Not on file  Tobacco Use   Smoking status: Never   Smokeless tobacco: Current    Types: Chew  Substance and Sexual Activity   Alcohol use: No   Drug use: No   Sexual activity: Never  Other Topics Concern   Not on file  Social History Narrative   ** Merged History Encounter **       Social Determinants of Health   Financial Resource Strain: Not on file  Food Insecurity: Not on file  Transportation Needs: Not on file  Physical Activity: Not on file  Stress: Not on file  Social Connections: Not on file  Intimate Partner Violence: Not on file      Review of Systems  Constitutional:  Negative for activity change, appetite change, chills, fatigue, fever and unexpected weight change.  HENT: Negative.  Negative for congestion, ear pain, rhinorrhea, sore throat and trouble swallowing.   Eyes: Negative.   Respiratory: Negative.  Negative for cough, chest tightness, shortness of breath and wheezing.   Cardiovascular: Negative.  Negative for chest pain.  Gastrointestinal: Negative.  Negative for abdominal pain, blood in stool, constipation, diarrhea, nausea and vomiting.  Endocrine: Negative.   Genitourinary: Negative.  Negative for difficulty urinating, dysuria, frequency, hematuria and urgency.  Musculoskeletal: Negative.  Negative for arthralgias, back pain, joint swelling, myalgias and neck pain.  Skin:  Negative.  Negative for rash and wound.  Allergic/Immunologic: Negative.  Negative for immunocompromised state.  Neurological: Negative.  Negative for dizziness, seizures, numbness and headaches.  Hematological: Negative.   Psychiatric/Behavioral: Negative.  Negative for behavioral problems, self-injury and suicidal ideas. The patient is not nervous/anxious.    Vital Signs: BP 130/70   Pulse 60   Temp 97.8 F (36.6 C)   Resp 16   Ht '5\' 3"'  (1.6 m)   Wt 265 lb (120.2 kg)   SpO2 97%   BMI 46.94 kg/m    Physical Exam Vitals reviewed.  Constitutional:      General: She is not in acute distress.    Appearance: Normal appearance. She is obese. She is not ill-appearing.  HENT:     Head: Normocephalic and atraumatic.     Right Ear: Tympanic membrane, ear canal and external ear normal.     Left Ear: Tympanic membrane, ear canal and external ear normal.  Nose: Nose normal. No congestion or rhinorrhea.     Mouth/Throat:     Mouth: Mucous membranes are moist.     Pharynx: Oropharynx is clear. No oropharyngeal exudate or posterior oropharyngeal erythema.  Eyes:     Extraocular Movements: Extraocular movements intact.     Conjunctiva/sclera: Conjunctivae normal.     Pupils: Pupils are equal, round, and reactive to light.  Cardiovascular:     Rate and Rhythm: Normal rate and regular rhythm.     Pulses: Normal pulses.     Heart sounds: Normal heart sounds. No murmur heard.   No friction rub. No gallop.  Pulmonary:     Effort: Pulmonary effort is normal. No respiratory distress.     Breath sounds: Normal breath sounds.  Abdominal:     General: Bowel sounds are normal. There is no distension.     Palpations: Abdomen is soft. There is no mass.     Tenderness: There is no abdominal tenderness. There is no guarding or rebound.     Hernia: No hernia is present.  Musculoskeletal:        General: Normal range of motion.     Cervical back: Normal range of motion and neck supple.  Skin:     General: Skin is warm and dry.     Capillary Refill: Capillary refill takes less than 2 seconds.  Neurological:     Mental Status: She is alert and oriented to person, place, and time.  Psychiatric:        Mood and Affect: Mood normal.        Behavior: Behavior normal.        Thought Content: Thought content normal.        Judgment: Judgment normal.    Assessment/Plan: 1. Encounter for general adult medical examination with abnormal findings Age-appropriate preventive screenings and vaccinations discussed, annual physical exam completed. Routine labs for health maintenance ordered, see below. PHM updated.   2. Chronic diastolic heart failure ([place]) Followed by cardiology, lab ordered - CMP14+EGFR  3. OSA on CPAP Stable, Using CPAP at night  4. Other iron deficiency anemia History of anemia, labs ordered - CBC with Differential/Platelet - B12 and Folate Panel  5. Acquired hypothyroidism Taking levothyroxine 88 mcg daily. Recheck thyroid levels - TSH + free T4  6. Morbid obesity with BMI of 45.0-49.9, adult [facility]) Patient and family member aware that weight loss would be beneficial to patient's health. Encouraged patient to decrease breads and sweets in diet, encouraged patient to move more by walking several days a week.   7. Mixed hyperlipidemia Recheck lipid panel - Lipid Profile  8. Onychomycosis of left great toe Requested referral ordered.  - Ambulatory referral to Podiatry  9. Blurry vision, bilateral Referral ordered - Ambulatory referral to Ophthalmology  10. Screening for osteoporosis Bone density ordered - DG Bone Density; Future  11. Impaired fasting glucose A1c checked, wnl - Hemoglobin A1c  12. Vitamin D deficiency Recheck vitamin D level - Vitamin D (25 hydroxy)     General Counseling: Brianna Gray verbalizes understanding of the findings of todays visit and agrees with plan of treatment. I have discussed any further diagnostic evaluation that may be  needed or ordered today. We also reviewed her medications today. she has been encouraged to call the office with any questions or concerns that should arise related to todays visit.    Orders Placed This Encounter  Procedures   DG Bone Density   CBC with Differential/Platelet   CMP14+EGFR  Lipid Profile   Vitamin D (25 hydroxy)   TSH + free T4   B12 and Folate Panel   Hemoglobin A1c   Ambulatory referral to Ophthalmology   Ambulatory referral to Podiatry    No orders of the defined types were placed in this encounter.   Return in about 3 months (around 01/07/2021) for F/U, med refill, Brianna Gray PCP.   Total time spent:30 Minutes Time spent includes review of chart, medications, test results, and follow up plan with the patient.   [place] Controlled Substance Database was reviewed by me.  This patient was seen by Jonetta Osgood, FNP-C in collaboration with Dr. Clayborn Bigness as a part of collaborative care agreement.  Bhavin Monjaraz R. Valetta Fuller, MSN, FNP-C Internal medicine

## 2020-10-07 NOTE — Telephone Encounter (Signed)
Spoke with patient's son. Gave him telephone # for [place] to schedule her bone density-[facility]oni

## 2020-10-13 ENCOUNTER — Other Ambulatory Visit: Payer: Self-pay

## 2020-10-13 ENCOUNTER — Encounter: Payer: Self-pay | Admitting: Podiatry

## 2020-10-13 ENCOUNTER — Ambulatory Visit: Payer: Medicaid Other | Admitting: Podiatry

## 2020-10-13 DIAGNOSIS — M79676 Pain in unspecified toe(s): Secondary | ICD-10-CM | POA: Diagnosis not present

## 2020-10-13 DIAGNOSIS — B351 Tinea unguium: Secondary | ICD-10-CM

## 2020-10-13 NOTE — Progress Notes (Signed)
Subjective:  Patient ID: Brianna Gray, female    DOB: [birth year],  MRN: [id number] HPI Chief Complaint  Patient presents with   Nail Problem    Hallux left - toenail is dark x 4-5 months   New Patient (Initial Visit)    70 y.o. female presents with the above complaint.   ROS: Denies fever chills nausea vomiting muscle aches pains calf pain back pain chest pain shortness of breath.  Past Medical History:  Diagnosis Date   Asthma    Cardiomyopathy ([place])    Carpal tunnel syndrome    Carpal tunnel syndrome    Dizziness    Edema    Elevated lipids    GERD (gastroesophageal reflux disease)    Hypertension    Lower extremity edema    Seasonal allergies    Sleep apnea    Wheezing    Past Surgical History:  Procedure Laterality Date   CHOLECYSTECTOMY     HERNIA REPAIR     HERNIA REPAIR     HYSTEROSCOPY WITH D & C N/A 03/19/2015   Procedure: DILATATION AND CURETTAGE /HYSTEROSCOPY and polypectomy;  Surgeon: Honor Loh Ward, MD;  Location: [facility];  Service: Gynecology;  Laterality: N/A;   LAPAROSCOPIC HYSTERECTOMY N/A 06/08/2016   Procedure: HYSTERECTOMY TOTAL LAPAROSCOPIC;  Surgeon: Gae Dry, MD;  Location: [facility];  Service: Gynecology;  Laterality: N/A;   LAPAROSCOPIC SALPINGO OOPHERECTOMY Bilateral 06/08/2016   Procedure: LAPAROSCOPIC SALPINGO OOPHORECTOMY;  Surgeon: Gae Dry, MD;  Location: [facility];  Service: Gynecology;  Laterality: Bilateral;   LAPAROTOMY N/A 06/08/2016   Procedure: LAPAROTOMY;  Surgeon: Gae Dry, MD;  Location: [facility];  Service: Gynecology;  Laterality: N/A;    Current Outpatient Medications:    acetic acid-hydrocortisone (VOSOL-HC) OTIC solution, Place 3 drops into both ears 2 (two) times daily. As needed for itching, Disp: 10 mL, Rfl: 0   ADVAIR DISKUS 250-50 MCG/DOSE AEPB, Inhale 1 puff into the lungs 2 (two) times daily as needed. For respiratory issues., Disp: 60 each, Rfl: 12   amLODipine (NORVASC) 5 MG tablet, Take 5 mg by  mouth daily., Disp: , Rfl:    amoxicillin-clavulanate (AUGMENTIN) 875-125 MG tablet, Take 1 tablet by mouth 2 (two) times daily., Disp: 14 tablet, Rfl: 0   atorvastatin (LIPITOR) 10 MG tablet, TAKE 1 TABLET BY MOUTH EVERYDAY AT BEDTIME, Disp: 90 tablet, Rfl: 5   augmented betamethasone dipropionate (DIPROLENE) 0.05 % ointment, Apply topically 2 (two) times daily., Disp: 30 g, Rfl: 0   esomeprazole (NEXIUM) 40 MG capsule, TAKE 1 CAPSULE BY MOUTH EVERY DAY FOR STOMACH, Disp: 90 capsule, Rfl: 3   fluticasone (FLONASE) 50 MCG/ACT nasal spray, Place 1-2 sprays into both nostrils daily as needed. For nasal congestion., Disp: 1 g, Rfl: 2   hydrALAZINE (APRESOLINE) 25 MG tablet, TAKE 1 TABLET BY MOUTH THREE TIMES A DAY, Disp: 90 tablet, Rfl: 1   ipratropium-albuterol (DUONEB) 0.5-2.5 (3) MG/3ML SOLN, One vial tid prn for asthma, faillure to MDI, Disp: 120 mL, Rfl: 1   levothyroxine (SYNTHROID) 88 MCG tablet, TAKE 1 TABLET BY MOUTH EVERY MORNING ON EMPTY STOMACH**OK TO CHANGE TO LANNETT PER MD**, Disp: 90 tablet, Rfl: 4   loratadine (CLARITIN) 10 MG tablet, TAKE 1 TABLET BY MOUTH EVERY DAY, Disp: 30 tablet, Rfl: 8   losartan (COZAAR) 100 MG tablet, TAKE 1 TABLET BY MOUTH EVERY DAY, Disp: 90 tablet, Rfl: 3   meloxicam (MOBIC) 7.5 MG tablet, TAKE 1 TABLET BY MOUTH TWICE A DAY FOR LOWER  BACK AND JOINT PAINS, Disp: 60 tablet, Rfl: 3   montelukast (SINGULAIR) 10 MG tablet, Take 1 tablet (10 mg total) by mouth at bedtime. For sinus congestion and allergies, Disp: 90 tablet, Rfl: 3   neomycin-polymyxin-hydrocortisone (CORTISPORIN) OTIC solution, 3-4 drops in both ears bid prn, Disp: 10 mL, Rfl: 0   Potassium 99 MG TABS, Take 99 mg by mouth daily at 12 noon., Disp: , Rfl:    torsemide (DEMADEX) 20 MG tablet, Take 20 mg by mouth daily., Disp: , Rfl:    vitamin B-12 (CYANOCOBALAMIN) 1000 MCG tablet, Take 1,000 mcg by mouth daily at 12 noon., Disp: , Rfl:   No Known Allergies Review of Systems Objective:  There  were no vitals filed for this visit.  General: Well developed, nourished, in no acute distress, alert and oriented x3   Dermatological: Skin is warm, dry and supple bilateral. Nails x 10 are well maintained; remaining integument appears unremarkable at this time. There are no open sores, no preulcerative lesions, no rash or signs of infection present.  Hallux nail left demonstrates a green discoloration with distal onycholysis.  No purulence no malodor  Vascular: Dorsalis Pedis artery and Posterior Tibial artery pedal pulses are 2/4 bilateral with immedate capillary fill time. Pedal hair growth present. No varicosities and no lower extremity edema present bilateral.   Neruologic: Grossly intact via light touch bilateral. Vibratory intact via tuning fork bilateral. Protective threshold with Semmes Wienstein monofilament intact to all pedal sites bilateral. Patellar and Achilles deep tendon reflexes 2+ bilateral. No Babinski or clonus noted bilateral.   Musculoskeletal: No gross boney pedal deformities bilateral. No pain, crepitus, or limitation noted with foot and ankle range of motion bilateral. Muscular strength 5/5 in all groups tested bilateral.  Gait: Unassisted, Nonantalgic.    Radiographs:  None taken  Assessment & Plan:   Assessment: Nail dystrophy cannot rule out onychomycosis or Pseudomonas  Plan: Took a sample of the nail and skin today to be sent for pathologic evaluation follow-up with her in 1 month     [name], [place]

## 2020-10-15 LAB — B12 AND FOLATE PANEL
Folate: 9.5 ng/mL (ref >3.0)
Vitamin B-12: >2000 pg/mL — ABNORMAL HIGH (ref 232–1245)

## 2020-10-15 LAB — CBC WITH DIFFERENTIAL/PLATELET
Basophils Absolute: 0.1 10*3/uL (ref 0.0–0.2)
Basos: 1 %
EOS (ABSOLUTE): 0.4 10*3/uL (ref 0.0–0.4)
Eos: 4 %
Hematocrit: 37.4 % (ref 34.0–46.6)
Hemoglobin: 11.5 g/dL (ref 11.1–15.9)
Immature Grans (Abs): 0 10*3/uL (ref 0.0–0.1)
Immature Granulocytes: 1 %
Lymphocytes Absolute: 2.2 10*3/uL (ref 0.7–3.1)
Lymphs: 27 %
MCH: 24.2 pg — ABNORMAL LOW (ref 26.6–33.0)
MCHC: 30.7 g/dL — ABNORMAL LOW (ref 31.5–35.7)
MCV: 79 fL (ref 79–97)
Monocytes Absolute: 0.6 10*3/uL (ref 0.1–0.9)
Monocytes: 8 %
Neutrophils Absolute: 5 10*3/uL (ref 1.4–7.0)
Neutrophils: 59 %
Platelets: 276 10*3/uL (ref 150–450)
RBC: 4.76 x10E6/uL (ref 3.77–5.28)
RDW: 14.4 % (ref 11.7–15.4)
WBC: 8.2 10*3/uL (ref 3.4–10.8)

## 2020-10-15 LAB — LIPID PANEL
Chol/HDL Ratio: 3.4 ratio (ref 0.0–4.4)
Cholesterol, Total: 161 mg/dL (ref 100–199)
HDL: 47 mg/dL (ref >39)
LDL Chol Calc (NIH): 87 mg/dL (ref 0–99)
Triglycerides: 157 mg/dL — ABNORMAL HIGH (ref 0–149)
VLDL Cholesterol Cal: 27 mg/dL (ref 5–40)

## 2020-10-15 LAB — CMP14+EGFR
ALT: 32 IU/L (ref 0–32)
AST: 29 IU/L (ref 0–40)
Albumin/Globulin Ratio: 1.7 (ref 1.2–2.2)
Albumin: 4 g/dL (ref 3.7–4.7)
Alkaline Phosphatase: 105 IU/L (ref 44–121)
BUN/Creatinine Ratio: 16 (ref 12–28)
BUN: 13 mg/dL (ref 8–27)
Bilirubin Total: 0.3 mg/dL (ref 0.0–1.2)
CO2: 23 mmol/L (ref 20–29)
Calcium: 9.9 mg/dL (ref 8.7–10.3)
Chloride: 103 mmol/L (ref 96–106)
Creatinine, Ser: 0.83 mg/dL (ref 0.57–1.00)
Globulin, Total: 2.4 g/dL (ref 1.5–4.5)
Glucose: 113 mg/dL — ABNORMAL HIGH (ref 65–99)
Potassium: 4.2 mmol/L (ref 3.5–5.2)
Sodium: 140 mmol/L (ref 134–144)
Total Protein: 6.4 g/dL (ref 6.0–8.5)
eGFR: 75 mL/min/{1.73_m2} (ref >59)

## 2020-10-15 LAB — TSH+FREE T4
Free T4: 1.05 ng/dL (ref 0.82–1.77)
TSH: 2.69 u[IU]/mL (ref 0.450–4.500)

## 2020-10-15 LAB — VITAMIN D 25 HYDROXY (VIT D DEFICIENCY, FRACTURES): Vit D, 25-Hydroxy: 17.4 ng/mL — ABNORMAL LOW (ref 30.0–100.0)

## 2020-10-26 ENCOUNTER — Encounter: Payer: Self-pay | Admitting: Podiatry

## 2020-11-03 ENCOUNTER — Telehealth: Payer: Self-pay

## 2020-11-03 NOTE — Telephone Encounter (Signed)
Opthamology referral had been sent via proficient to [facility] 10/12/20. Was notified today that they do not accept her insurance. Manually faxed referral to [facility]

## 2020-11-12 NOTE — Telephone Encounter (Signed)
Patient schedule for eye exam 11/30/20 @ 3:00 with [facility]. Notified her son with appointment information-Toni

## 2020-11-17 ENCOUNTER — Ambulatory Visit: Payer: Medicaid Other | Admitting: Podiatry

## 2020-11-17 ENCOUNTER — Encounter: Payer: Self-pay | Admitting: Podiatry

## 2020-11-17 ENCOUNTER — Other Ambulatory Visit: Payer: Self-pay

## 2020-11-17 ENCOUNTER — Encounter (INDEPENDENT_AMBULATORY_CARE_PROVIDER_SITE_OTHER): Payer: Self-pay

## 2020-11-17 DIAGNOSIS — L603 Nail dystrophy: Secondary | ICD-10-CM | POA: Diagnosis not present

## 2020-11-17 NOTE — Progress Notes (Signed)
She presents today for follow-up of her pathology results of her toenails.  She denies any new problems.  Objective: Vital signs are stable alert and oriented x3 her son is here for interpretation today I explained to him that she has onychomycosis based on pathology.  Assessment: Onychomycosis.  Plan: I discussed with both of them today the pros and cons of oral therapy topical therapy and laser therapy.  At this point she does not care to pursue any of these avenues.

## 2020-11-18 ENCOUNTER — Other Ambulatory Visit: Payer: Self-pay | Admitting: Internal Medicine

## 2020-12-07 ENCOUNTER — Ambulatory Visit: Payer: Medicaid Other | Admitting: Nurse Practitioner

## 2020-12-17 ENCOUNTER — Other Ambulatory Visit: Payer: Self-pay | Admitting: Internal Medicine

## 2020-12-26 ENCOUNTER — Telehealth: Payer: Self-pay

## 2020-12-26 MED ORDER — HYDRALAZINE HCL 25 MG PO TABS: 25.0000 mg | ORAL_TABLET | Freq: Three times a day (TID) | ORAL | 1 refills | Status: DC

## 2020-12-26 NOTE — Telephone Encounter (Signed)
Per [facility] called in Hydralazine 25 mg take 1 tab by mouth 3 times a day  #270 w/1 rf to [facility] [address].

## 2021-01-16 ENCOUNTER — Other Ambulatory Visit: Payer: Self-pay | Admitting: Internal Medicine

## 2021-01-17 ENCOUNTER — Other Ambulatory Visit: Payer: Self-pay

## 2021-01-17 MED ORDER — LOSARTAN POTASSIUM 100 MG PO TABS: 100.0000 mg | ORAL_TABLET | Freq: Every day | ORAL | 3 refills | Status: DC

## 2021-02-18 ENCOUNTER — Ambulatory Visit: Payer: Medicaid Other | Admitting: Nurse Practitioner

## 2021-02-18 ENCOUNTER — Encounter: Payer: Self-pay | Admitting: Nurse Practitioner

## 2021-02-18 ENCOUNTER — Other Ambulatory Visit: Payer: Self-pay

## 2021-02-18 VITALS — BP 140/79 | HR 64 | Temp 98.2°F | Resp 16 | Ht 63.0 in | Wt 272.6 lb

## 2021-02-18 DIAGNOSIS — E039 Hypothyroidism, unspecified: Secondary | ICD-10-CM

## 2021-02-18 DIAGNOSIS — E782 Mixed hyperlipidemia: Secondary | ICD-10-CM | POA: Diagnosis not present

## 2021-02-18 DIAGNOSIS — E559 Vitamin D deficiency, unspecified: Secondary | ICD-10-CM

## 2021-02-18 DIAGNOSIS — D508 Other iron deficiency anemias: Secondary | ICD-10-CM | POA: Diagnosis not present

## 2021-02-18 DIAGNOSIS — R7301 Impaired fasting glucose: Secondary | ICD-10-CM

## 2021-02-18 DIAGNOSIS — Z23 Encounter for immunization: Secondary | ICD-10-CM

## 2021-02-18 MED ORDER — PNEUMOCOCCAL 20-VAL CONJ VACC 0.5 ML IM SUSY
0.5000 mL | PREFILLED_SYRINGE | INTRAMUSCULAR | 0 refills | Status: AC
Start: 2021-02-18 — End: 2021-02-18

## 2021-02-18 NOTE — Progress Notes (Signed)
[facility] [place], [place] [address]  Internal MEDICINE  Office Visit Note  Patient Name: Brianna Gray  [id number]  [id number]  Date of Service: 02/18/2021  Chief Complaint  Patient presents with   Follow-up    Left ear itching, discuss meds    Medication Refill    HPI Brianna Gray presents for a follow up visit for medication review. She needs to get the pneumonia vaccine and repeat labs. Her blood pressure is stable. She has no other questions or concerns today. She denies any pain.    Current Medication: Outpatient Encounter Medications as of 02/18/2021  Medication Sig   acetic acid-hydrocortisone (VOSOL-HC) OTIC solution Place 3 drops into both ears 2 (two) times daily. As needed for itching   ADVAIR DISKUS 250-50 MCG/DOSE AEPB Inhale 1 puff into the lungs 2 (two) times daily as needed. For respiratory issues.   amLODipine (NORVASC) 5 MG tablet Take 5 mg by mouth daily.   atorvastatin (LIPITOR) 10 MG tablet TAKE 1 TABLET BY MOUTH EVERYDAY AT BEDTIME   augmented betamethasone dipropionate (DIPROLENE) 0.05 % ointment Apply topically 2 (two) times daily.   esomeprazole (NEXIUM) 40 MG capsule TAKE 1 CAPSULE BY MOUTH EVERY DAY FOR STOMACH   fluticasone (FLONASE) 50 MCG/ACT nasal spray Place 1-2 sprays into both nostrils daily as needed. For nasal congestion.   hydrALAZINE (APRESOLINE) 25 MG tablet Take 1 tablet (25 mg total) by mouth 3 (three) times daily.   ipratropium-albuterol (DUONEB) 0.5-2.5 (3) MG/3ML SOLN One vial tid prn for asthma, faillure to MDI   levothyroxine (SYNTHROID) 88 MCG tablet TAKE 1 TABLET BY MOUTH EVERY MORNING ON EMPTY STOMACH**OK TO CHANGE TO LANNETT PER MD**   loratadine (CLARITIN) 10 MG tablet TAKE 1 TABLET BY MOUTH EVERY DAY   losartan (COZAAR) 100 MG tablet Take 1 tablet (100 mg total) by mouth daily.   meloxicam (MOBIC) 7.5 MG tablet TAKE 1 TABLET BY MOUTH TWICE A DAY FOR LOWER BACK AND JOINT PAINS   montelukast (SINGULAIR) 10  MG tablet Take 1 tablet (10 mg total) by mouth at bedtime. For sinus congestion and allergies   neomycin-polymyxin-hydrocortisone (CORTISPORIN) OTIC solution 3-4 drops in both ears bid prn   Potassium 99 MG TABS Take 99 mg by mouth daily at 12 noon.   torsemide (DEMADEX) 20 MG tablet Take 20 mg by mouth daily.   vitamin B-12 (CYANOCOBALAMIN) 1000 MCG tablet Take 1,000 mcg by mouth daily at 12 noon.   [DISCONTINUED] amoxicillin-clavulanate (AUGMENTIN) 875-125 MG tablet Take 1 tablet by mouth 2 (two) times daily.   [DISCONTINUED] pneumococcal 20-valent conjugate vaccine (PREVNAR 20) 0.5 ML injection Inject 0.5 mLs into the muscle tomorrow at 10 am.   pneumococcal 20-valent conjugate vaccine (PREVNAR 20) 0.5 ML injection Inject 0.5 mLs into the muscle tomorrow at 10 am for 1 dose.   No facility-administered encounter medications on file as of 02/18/2021.    Surgical History: Past Surgical History:  Procedure Laterality Date   CHOLECYSTECTOMY     HERNIA REPAIR     HERNIA REPAIR     HYSTEROSCOPY WITH D & C N/A 03/19/2015   Procedure: DILATATION AND CURETTAGE /HYSTEROSCOPY and polypectomy;  Surgeon: Honor Loh Ward, MD;  Location: [facility];  Service: Gynecology;  Laterality: N/A;   LAPAROSCOPIC HYSTERECTOMY N/A 06/08/2016   Procedure: HYSTERECTOMY TOTAL LAPAROSCOPIC;  Surgeon: Gae Dry, MD;  Location: [facility];  Service: Gynecology;  Laterality: N/A;   LAPAROSCOPIC SALPINGO OOPHERECTOMY Bilateral 06/08/2016   Procedure: LAPAROSCOPIC SALPINGO OOPHORECTOMY;  Surgeon: Gae Dry, MD;  Location: [facility];  Service: Gynecology;  Laterality: Bilateral;   LAPAROTOMY N/A 06/08/2016   Procedure: LAPAROTOMY;  Surgeon: Gae Dry, MD;  Location: [facility];  Service: Gynecology;  Laterality: N/A;    Medical History: Past Medical History:  Diagnosis Date   Asthma    Cardiomyopathy ([place])    Carpal tunnel syndrome    Carpal tunnel syndrome    Dizziness    Edema    Elevated lipids    GERD  (gastroesophageal reflux disease)    Hypertension    Lower extremity edema    Seasonal allergies    Sleep apnea    Wheezing     Family History: Family History  Problem Relation Age of Onset   Diabetes Brother    Hypertension Brother    Breast cancer Neg Hx     Social History   Socioeconomic History   Marital status: Married    Spouse name: Not on file   Number of children: Not on file   Years of education: Not on file   Highest education level: Not on file  Occupational History   Not on file  Tobacco Use   Smoking status: Never   Smokeless tobacco: Current    Types: Chew  Substance and Sexual Activity   Alcohol use: No   Drug use: No   Sexual activity: Never  Other Topics Concern   Not on file  Social History Narrative   ** Merged History Encounter **       Social Determinants of Health   Financial Resource Strain: Not on file  Food Insecurity: Not on file  Transportation Needs: Not on file  Physical Activity: Not on file  Stress: Not on file  Social Connections: Not on file  Intimate Partner Violence: Not on file      Review of Systems  Constitutional:  Negative for chills, fatigue and unexpected weight change.  HENT:  Negative for congestion, rhinorrhea, sneezing and sore throat.   Eyes:  Negative for redness.  Respiratory:  Negative for cough, chest tightness and shortness of breath.   Cardiovascular:  Negative for chest pain and palpitations.  Gastrointestinal:  Negative for abdominal pain, constipation, diarrhea, nausea and vomiting.  Genitourinary:  Negative for dysuria and frequency.  Musculoskeletal:  Negative for arthralgias, back pain, joint swelling and neck pain.  Skin:  Negative for rash.  Neurological: Negative.  Negative for tremors and numbness.  Hematological:  Negative for adenopathy. Does not bruise/bleed easily.  Psychiatric/Behavioral:  Negative for behavioral problems (Depression), sleep disturbance and suicidal ideas. The patient  is not nervous/anxious.    Vital Signs: BP (!) 148/79    Pulse 64    Temp 98.2 F (36.8 C)    Resp 16    Ht 5\' 3"  (1.6 m)    Wt 272 lb 9.6 oz (123.7 kg)    SpO2 97%    BMI 48.29 kg/m    Physical Exam Vitals reviewed.  Constitutional:      General: She is not in acute distress.    Appearance: Normal appearance. She is obese. She is not ill-appearing.  HENT:     Head: Normocephalic and atraumatic.  Eyes:     Pupils: Pupils are equal, round, and reactive to light.  Cardiovascular:     Rate and Rhythm: Normal rate and regular rhythm.  Pulmonary:     Effort: Pulmonary effort is normal. No respiratory distress.  Neurological:     Mental Status: She  is alert and oriented to person, place, and time.     Cranial Nerves: No cranial nerve deficit.  Psychiatric:        Mood and Affect: Mood normal.        Behavior: Behavior normal.       Assessment/Plan: 1. Acquired hypothyroidism Routine lab ordered.  - TSH + free T4  2. Other iron deficiency anemia Routine lab ordered.  - CBC with Differential/Platelet  3. Mixed hyperlipidemia Routine lab ordered.  - Lipid Profile  4. Vitamin D deficiency Routine lab ordered.  - Vitamin D (25 hydroxy)  5. Impaired fasting glucose Routine lab ordered.  - Comprehensive Metabolic Panel (CMET)  6. Need for vaccination - pneumococcal 20-valent conjugate vaccine (PREVNAR 20) 0.5 ML injection; Inject 0.5 mLs into the muscle tomorrow at 10 am for 1 dose.  Dispense: 0.5 mL; Refill: 0   General Counseling: Brianna Gray verbalizes understanding of the findings of todays visit and agrees with plan of treatment. I have discussed any further diagnostic evaluation that may be needed or ordered today. We also reviewed her medications today. she has been encouraged to call the office with any questions or concerns that should arise related to todays visit.    Orders Placed This Encounter  Procedures   CBC with Differential/Platelet   Comprehensive  Metabolic Panel (CMET)   Vitamin D (25 hydroxy)   Lipid Profile   TSH + free T4    Meds ordered this encounter  Medications   pneumococcal 20-valent conjugate vaccine (PREVNAR 20) 0.5 ML injection    Sig: Inject 0.5 mLs into the muscle tomorrow at 10 am for 1 dose.    Dispense:  0.5 mL    Refill:  0    Return in about 4 months (around 06/19/2021) for F/U, Brianna Gray PCP.   Total time spent:30 Minutes Time spent includes review of chart, medications, test results, and follow up plan with the patient.   [place] Controlled Substance Database was reviewed by me.  This patient was seen by Jonetta Osgood, FNP-C in collaboration with Dr. Clayborn Bigness as a part of collaborative care agreement.   Shubh Chiara R. Valetta Fuller, MSN, FNP-C Internal medicine

## 2021-02-24 LAB — COMPREHENSIVE METABOLIC PANEL
ALT: 37 IU/L — ABNORMAL HIGH (ref 0–32)
AST: 36 IU/L (ref 0–40)
Albumin/Globulin Ratio: 1.3 (ref 1.2–2.2)
Albumin: 4 g/dL (ref 3.7–4.7)
Alkaline Phosphatase: 115 IU/L (ref 44–121)
BUN/Creatinine Ratio: 18 (ref 12–28)
BUN: 18 mg/dL (ref 8–27)
Bilirubin Total: 0.3 mg/dL (ref 0.0–1.2)
CO2: 26 mmol/L (ref 20–29)
Calcium: 10 mg/dL (ref 8.7–10.3)
Chloride: 102 mmol/L (ref 96–106)
Creatinine, Ser: 1 mg/dL (ref 0.57–1.00)
Globulin, Total: 3 g/dL (ref 1.5–4.5)
Glucose: 126 mg/dL — ABNORMAL HIGH (ref 70–99)
Potassium: 4.3 mmol/L (ref 3.5–5.2)
Sodium: 142 mmol/L (ref 134–144)
Total Protein: 7 g/dL (ref 6.0–8.5)
eGFR: 60 mL/min/{1.73_m2} (ref >59)

## 2021-02-24 LAB — LIPID PANEL
Chol/HDL Ratio: 3.2 ratio (ref 0.0–4.4)
Cholesterol, Total: 177 mg/dL (ref 100–199)
HDL: 55 mg/dL (ref >39)
LDL Chol Calc (NIH): 98 mg/dL (ref 0–99)
Triglycerides: 139 mg/dL (ref 0–149)
VLDL Cholesterol Cal: 24 mg/dL (ref 5–40)

## 2021-02-24 LAB — TSH+FREE T4
Free T4: 1.04 ng/dL (ref 0.82–1.77)
TSH: 2.52 u[IU]/mL (ref 0.450–4.500)

## 2021-02-24 LAB — CBC WITH DIFFERENTIAL/PLATELET
Basophils Absolute: 0 10*3/uL (ref 0.0–0.2)
Basos: 0 %
EOS (ABSOLUTE): 0.4 10*3/uL (ref 0.0–0.4)
Eos: 5 %
Hematocrit: 36.7 % (ref 34.0–46.6)
Hemoglobin: 11.7 g/dL (ref 11.1–15.9)
Immature Grans (Abs): 0.1 10*3/uL (ref 0.0–0.1)
Immature Granulocytes: 1 %
Lymphocytes Absolute: 2.4 10*3/uL (ref 0.7–3.1)
Lymphs: 25 %
MCH: 25.3 pg — ABNORMAL LOW (ref 26.6–33.0)
MCHC: 31.9 g/dL (ref 31.5–35.7)
MCV: 79 fL (ref 79–97)
Monocytes Absolute: 0.7 10*3/uL (ref 0.1–0.9)
Monocytes: 7 %
Neutrophils Absolute: 5.9 10*3/uL (ref 1.4–7.0)
Neutrophils: 62 %
Platelets: 265 10*3/uL (ref 150–450)
RBC: 4.62 x10E6/uL (ref 3.77–5.28)
RDW: 14.1 % (ref 11.7–15.4)
WBC: 9.5 10*3/uL (ref 3.4–10.8)

## 2021-02-24 LAB — VITAMIN D 25 HYDROXY (VIT D DEFICIENCY, FRACTURES): Vit D, 25-Hydroxy: 17.1 ng/mL — ABNORMAL LOW (ref 30.0–100.0)

## 2021-03-09 ENCOUNTER — Telehealth: Payer: Self-pay

## 2021-03-09 ENCOUNTER — Other Ambulatory Visit: Payer: Self-pay

## 2021-03-09 MED ORDER — ERGOCALCIFEROL 1.25 MG (50000 UT) PO CAPS: 50000.0000 [IU] | ORAL_CAPSULE | ORAL | 5 refills | Status: AC

## 2021-03-09 NOTE — Telephone Encounter (Signed)
-----   Message from Jonetta Osgood, NP sent at 03/09/2021  4:40 AM EST ----- .please call patient and let her know her lab results:  -thyroid levels and cholesterol levels are normal - CBC is grossly normal. - metabolic panel is grossly normal - vitamin D level is significantly low. I will send a prescription to the pharmacy for vitamin D 50,000 unit capsule, take 1 capsule by mouth weekly. We will repeat the vitamin D level in 3-6 months.

## 2021-03-09 NOTE — Telephone Encounter (Signed)
LMOM pt needs blood work results

## 2021-03-09 NOTE — Telephone Encounter (Signed)
Pt son notified for labs result and send vitamin d for once a week to phar

## 2021-03-09 NOTE — Progress Notes (Signed)
.  please call patient and let her know her lab results:  -thyroid levels and cholesterol levels are normal - CBC is grossly normal. - metabolic panel is grossly normal - vitamin D level is significantly low. I will send a prescription to the pharmacy for vitamin D 50,000 unit capsule, take 1 capsule by mouth weekly. We will repeat the vitamin D level in 3-6 months.

## 2021-03-27 ENCOUNTER — Encounter: Payer: Self-pay | Admitting: Nurse Practitioner

## 2021-04-01 ENCOUNTER — Other Ambulatory Visit: Payer: Self-pay

## 2021-04-01 MED ORDER — MECLIZINE HCL 25 MG PO TABS: 25.0000 mg | ORAL_TABLET | Freq: Two times a day (BID) | ORAL | 0 refills | Status: DC | PRN

## 2021-04-07 ENCOUNTER — Other Ambulatory Visit: Payer: Self-pay

## 2021-04-07 ENCOUNTER — Telehealth: Payer: Self-pay

## 2021-04-07 DIAGNOSIS — G8929 Other chronic pain: Secondary | ICD-10-CM

## 2021-04-07 DIAGNOSIS — M25569 Pain in unspecified knee: Secondary | ICD-10-CM

## 2021-04-07 DIAGNOSIS — M25562 Pain in left knee: Secondary | ICD-10-CM

## 2021-04-07 NOTE — Telephone Encounter (Signed)
Error

## 2021-04-07 NOTE — Addendum Note (Signed)
Addended by: Jonetta Osgood on: 04/07/2021 04:31 PM   Modules accepted: Orders

## 2021-04-07 NOTE — Telephone Encounter (Signed)
Notified son ortho referral sent to [facility]

## 2021-04-12 ENCOUNTER — Ambulatory Visit: Payer: Medicaid Other | Admitting: Internal Medicine

## 2021-04-28 ENCOUNTER — Other Ambulatory Visit: Payer: Self-pay | Admitting: Physician Assistant

## 2021-04-28 DIAGNOSIS — M79676 Pain in unspecified toe(s): Secondary | ICD-10-CM

## 2021-04-28 DIAGNOSIS — W19XXXA Unspecified fall, initial encounter: Secondary | ICD-10-CM

## 2021-04-29 ENCOUNTER — Ambulatory Visit: Payer: Medicaid Other | Admitting: Podiatry

## 2021-04-29 ENCOUNTER — Encounter: Payer: Self-pay | Admitting: Podiatry

## 2021-04-29 ENCOUNTER — Other Ambulatory Visit: Payer: Self-pay

## 2021-04-29 DIAGNOSIS — S91202A Unspecified open wound of left great toe with damage to nail, initial encounter: Secondary | ICD-10-CM | POA: Diagnosis not present

## 2021-04-29 DIAGNOSIS — R52 Pain, unspecified: Secondary | ICD-10-CM

## 2021-04-29 MED ORDER — GENTAMICIN SULFATE 0.1 % EX CREA: 1.0000 "application " | TOPICAL_CREAM | Freq: Two times a day (BID) | CUTANEOUS | 1 refills | Status: AC

## 2021-04-29 NOTE — Progress Notes (Signed)
? ?HPI: 71 y.o. female presenting today with her son for new complaint regarding pain and tenderness associated to the left great toe.  Patient states that she ran into another person in a wheelchair and stubbed her left great toe.  This occurred about 2 weeks ago.  She was prescribed some oral antibiotics cefixime for 5 days.  They present today for follow-up treatment and evaluation. ? ?Past Medical History:  ?Diagnosis Date  ? Asthma   ? Cardiomyopathy ([place])   ? Carpal tunnel syndrome   ? Carpal tunnel syndrome   ? Dizziness   ? Edema   ? Elevated lipids   ? GERD (gastroesophageal reflux disease)   ? Hypertension   ? Lower extremity edema   ? Seasonal allergies   ? Sleep apnea   ? Wheezing   ? ? ?Past Surgical History:  ?Procedure Laterality Date  ? CHOLECYSTECTOMY    ? HERNIA REPAIR    ? HERNIA REPAIR    ? HYSTEROSCOPY WITH D & C N/A 03/19/2015  ? Procedure: DILATATION AND CURETTAGE /HYSTEROSCOPY and polypectomy;  Surgeon: Honor Loh Ward, MD;  Location: [facility];  Service: Gynecology;  Laterality: N/A;  ? LAPAROSCOPIC HYSTERECTOMY N/A 06/08/2016  ? Procedure: HYSTERECTOMY TOTAL LAPAROSCOPIC;  Surgeon: Gae Dry, MD;  Location: [facility];  Service: Gynecology;  Laterality: N/A;  ? LAPAROSCOPIC SALPINGO OOPHERECTOMY Bilateral 06/08/2016  ? Procedure: LAPAROSCOPIC SALPINGO OOPHORECTOMY;  Surgeon: Gae Dry, MD;  Location: [facility];  Service: Gynecology;  Laterality: Bilateral;  ? LAPAROTOMY N/A 06/08/2016  ? Procedure: LAPAROTOMY;  Surgeon: Gae Dry, MD;  Location: [facility];  Service: Gynecology;  Laterality: N/A;  ? ? ?No Known Allergies ?  ?Physical Exam: ?General: The patient is alert and oriented x3 in no acute distress. ? ?Dermatology: The left hallux nail plate is loosely adhered with some subungual debris.  There is no erythema or edema around the toe.  No active bleeding ? ?Vascular: Palpable pedal pulses bilaterally. Capillary refill within normal limits.  Negative for any significant edema  or erythema ? ?Neurological: Light touch and protective threshold grossly intact ? ?Musculoskeletal Exam: No pedal deformities noted ? ?Assessment: ?1.  Injury of toenail plate left hallux ? ? ?Plan of Care:  ?1. Patient evaluated.  ?2.  After evaluating the patient and the toe I do believe it is appropriate and would be best for the patient to remove the toenail completely to allow the new nail to grow in.  The patient and son both agree ?3.  The nail was loosely adhered throughout the majority of the nail plate.  It was well adhered to the medial portion of the nail plate.  No anesthesia was utilized since the patient states that she did not have much sensitivity to the toe.  It was avulsed in its entirety and the patient tolerated it without anesthesia. ?4.  Light dressing applied.  Post care instructions provided. ?5.  Prescription for gentamicin cream applied daily ?6.  Return to clinic as needed ? ?*Came in today with her son, Lazarus Gowda ? ?  ?  ?Edrick Kins, DPM ?[place] ? ?Dr. Edrick Kins, DPM  ?  ?[address]                                        ?[place], [place] [address]                ?  Office [phone]  ?Fax [phone] ? ? ? ? ?

## 2021-05-17 IMAGING — CR DG CHEST 2V
1 series · 2 of 2 positions shown · non-contrast
Comparison: Prior chest radiograph 05/24/2016 and earlier.

CLINICAL DATA: Shortness of breath. Additional provided:
Hypertension, systolic blood pressure greater than 200, swelling in
feet

EXAM:
CHEST - 2 VIEW

[Series 1: dg chest 2 view · 0.14mm/px · 2 of 2 slices shown]
[im 1/2]
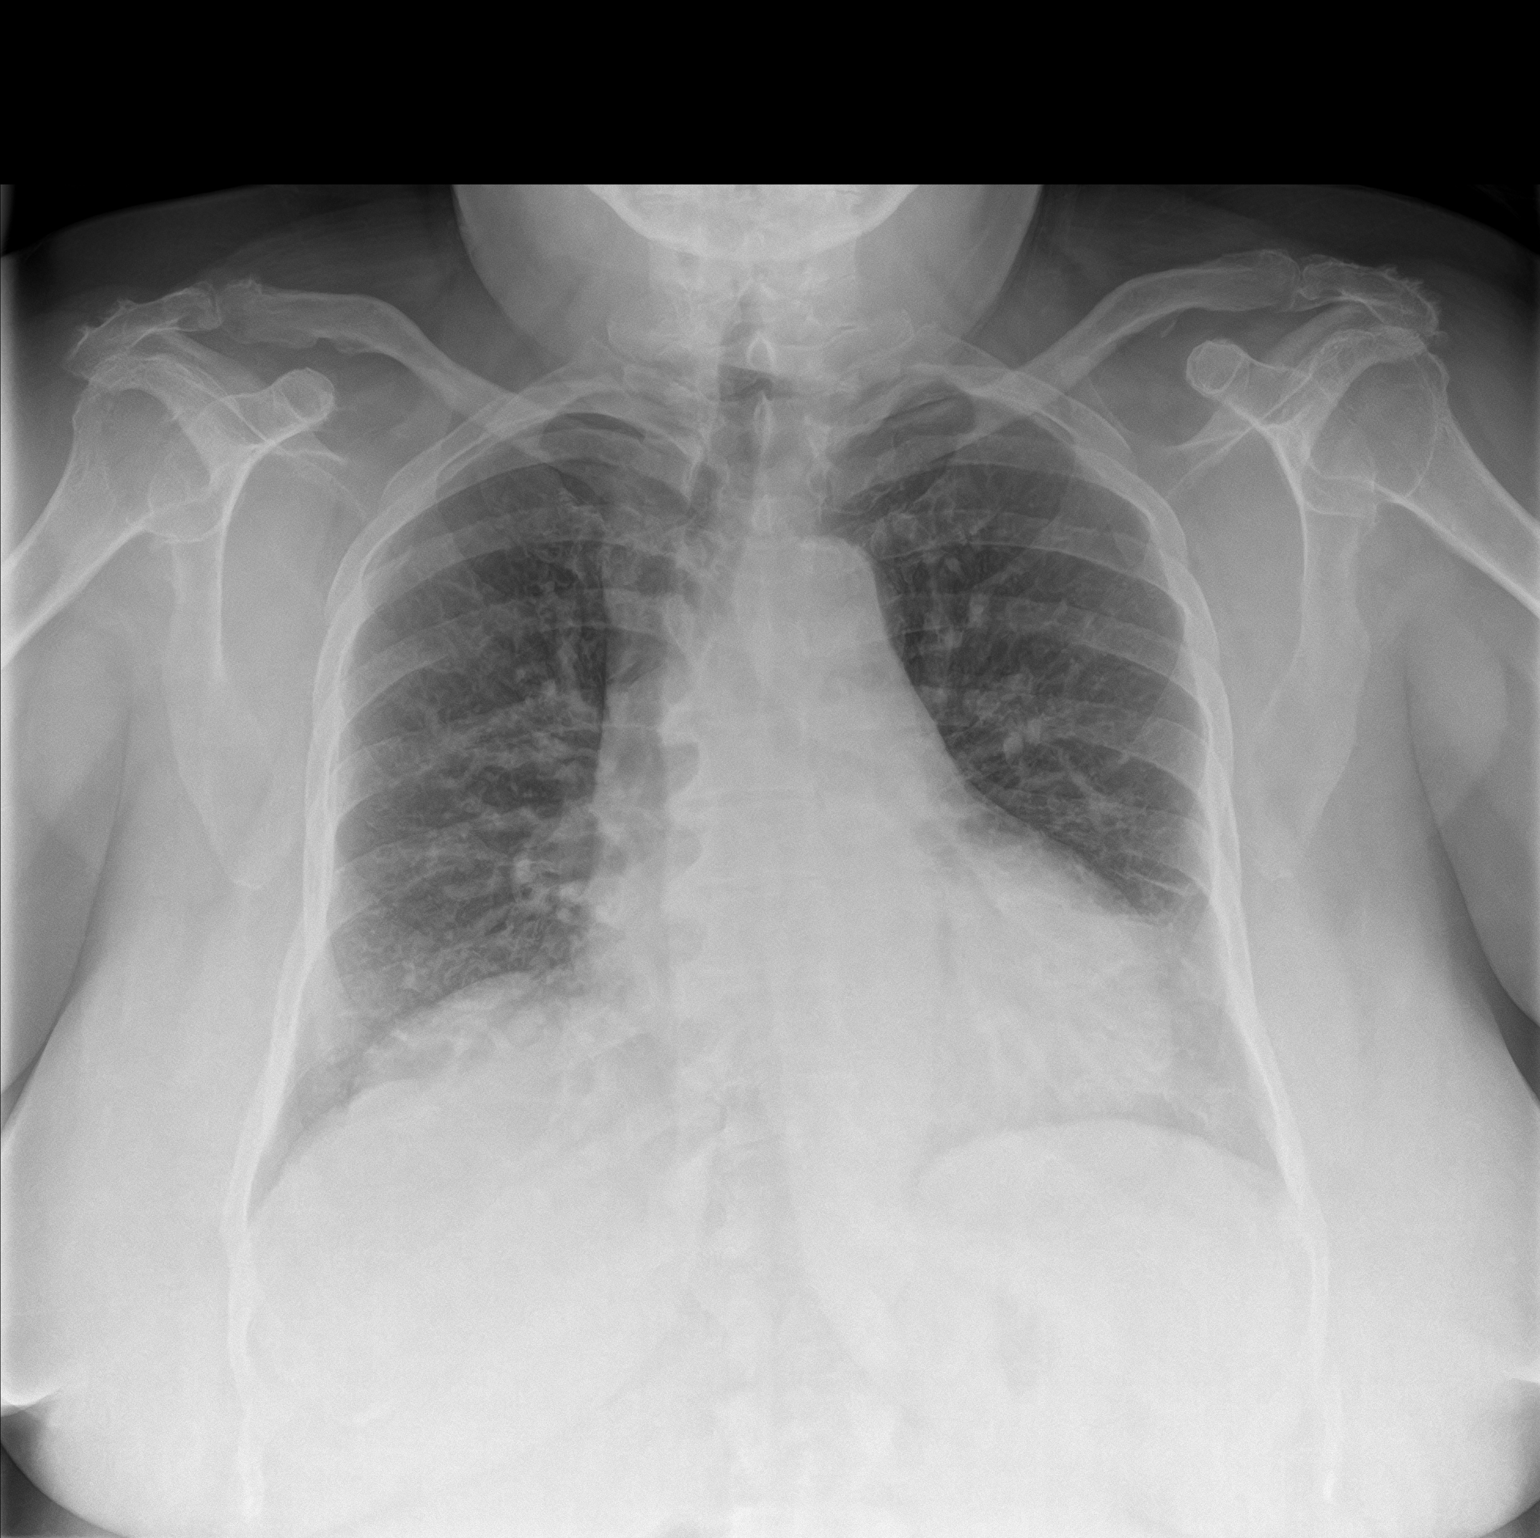
[im 2/2]
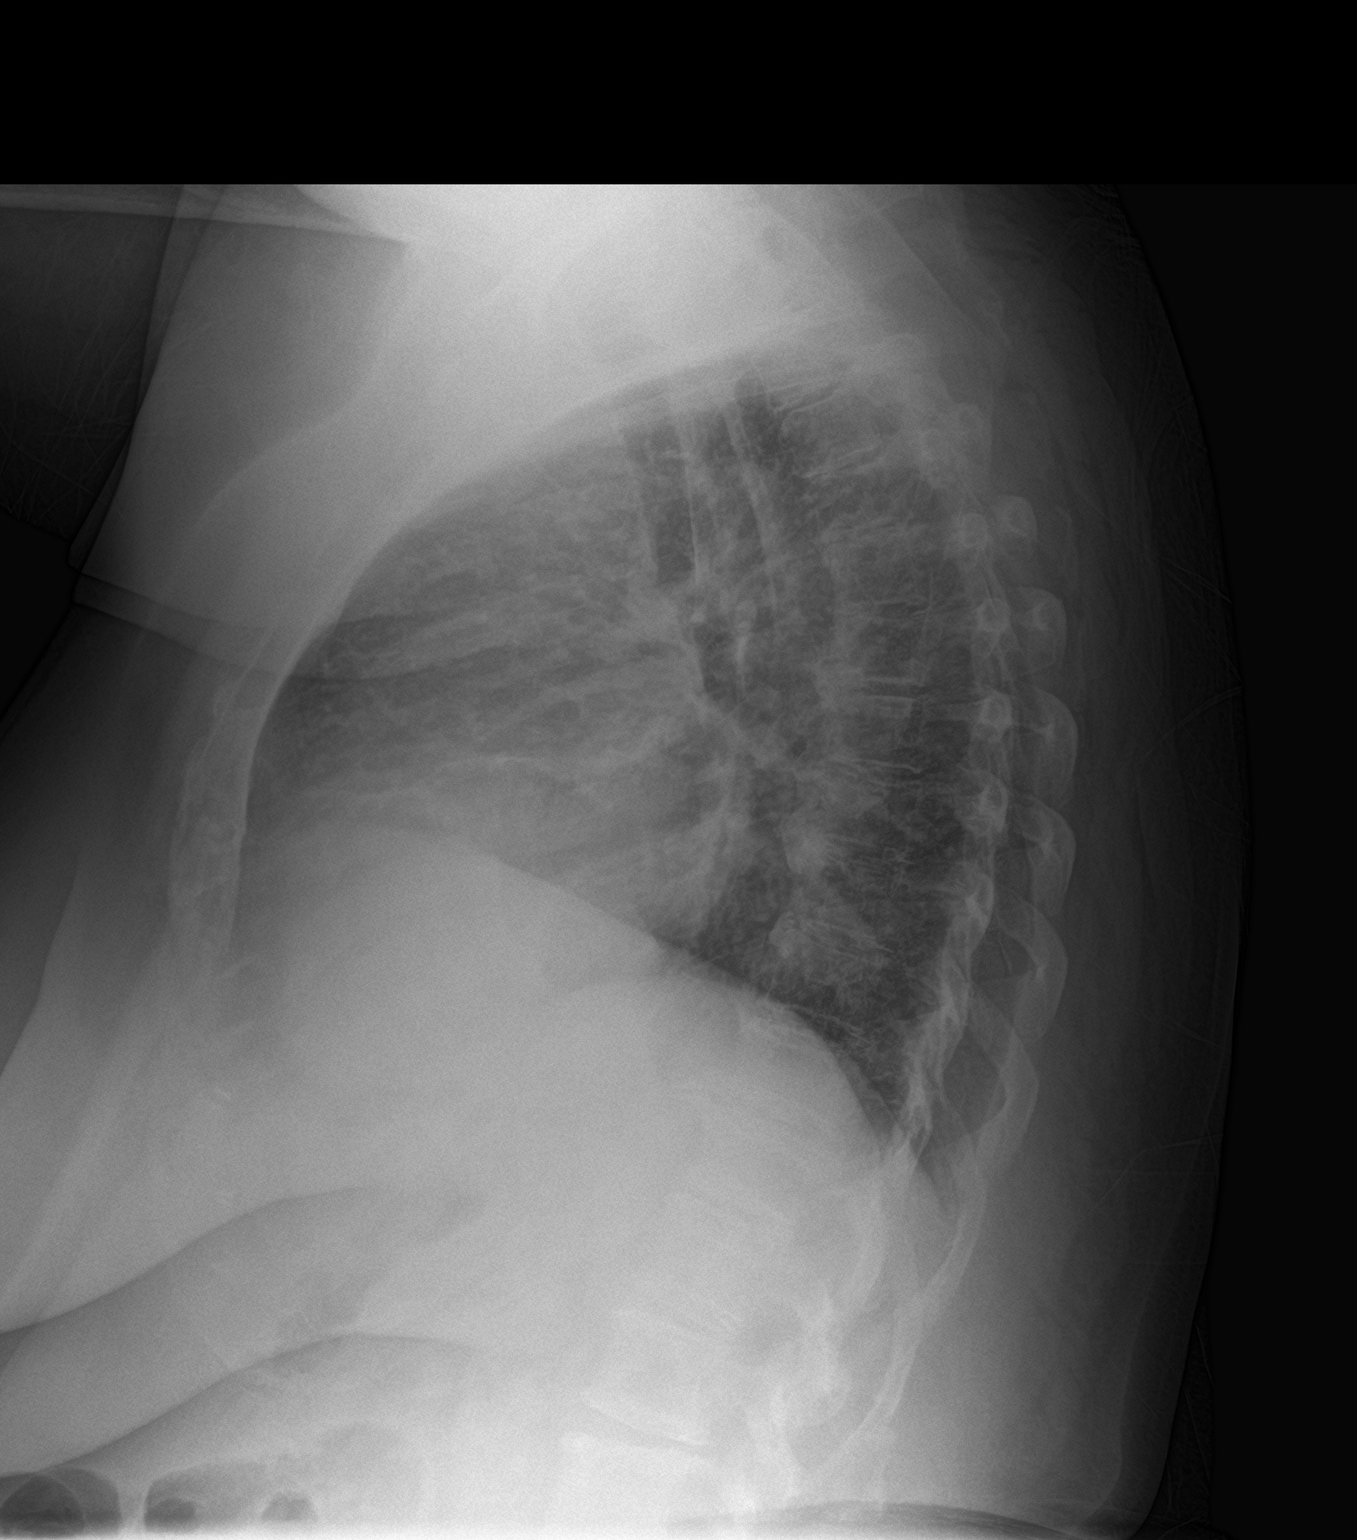

[2 of 2 positions shown; findings below may reference images not displayed]

FINDINGS: Heart size within normal limits. Aortic atherosclerosis. No
appreciable airspace consolidation within the lungs. No frank
pulmonary edema. No evidence of pleural effusion or pneumothorax. No
acute bony abnormality identified. Thoracic spondylosis.
IMPRESSION: No evidence of acute cardiopulmonary abnormality.

Aortic Atherosclerosis ([id number]).

Thoracic spondylosis.

## 2021-05-17 IMAGING — US US EXTREM LOW VENOUS
1 series · 14 of 24 positions shown · non-contrast
Comparison: None.

CLINICAL DATA: Leg swelling

EXAM:
BILATERAL LOWER EXTREMITY VENOUS DOPPLER ULTRASOUND
TECHNIQUE: Gray-scale sonography with compression, as well as color and duplex
ultrasound, were performed to evaluate the deep venous system(s)
from the level of the common femoral vein through the popliteal and
proximal calf veins.

[Series 1: us venous img lower bilat (dvt) · portal-venous · 14 of 58 slices shown]
[im 1/58]
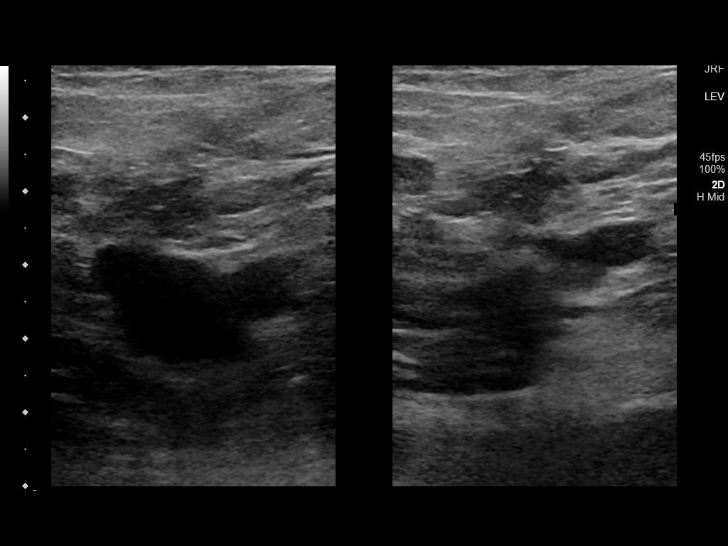
[im 5/58]
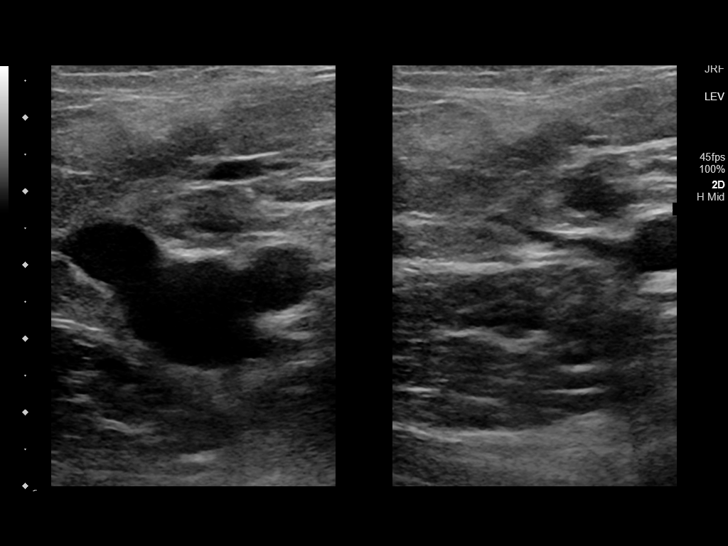
[im 10/58]
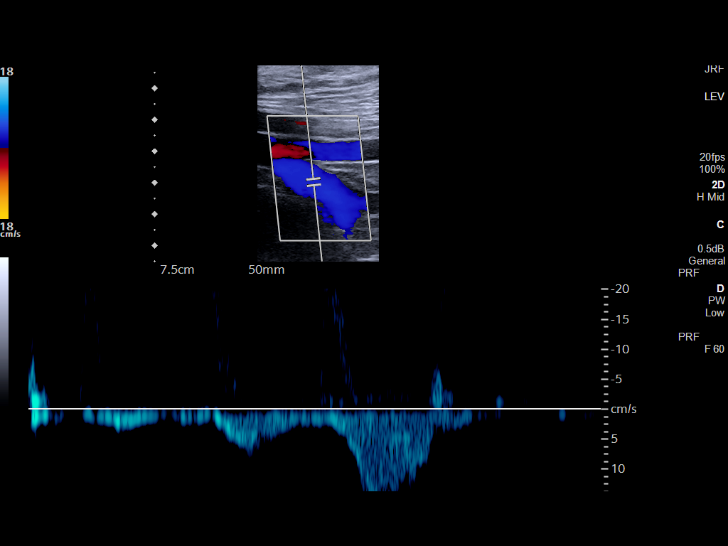
[im 15/58]
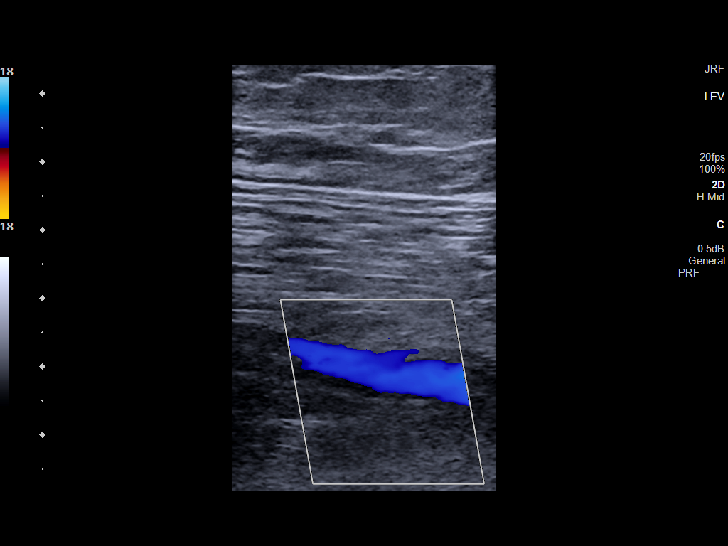
[im 18/58]
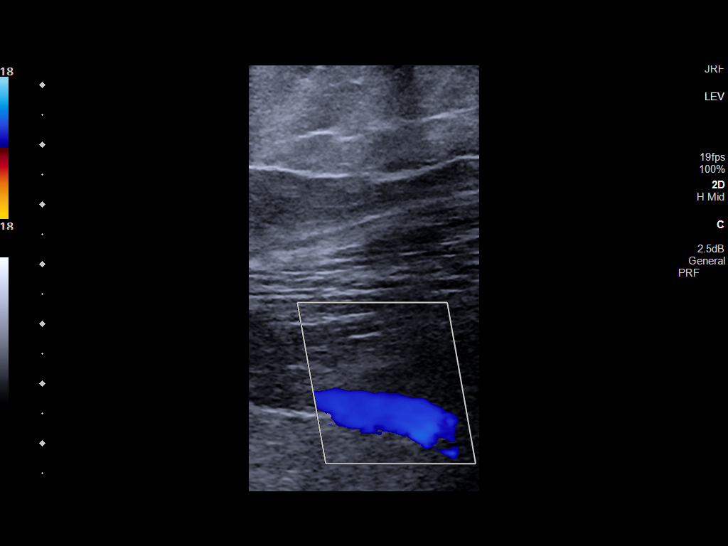
[im 23/58]
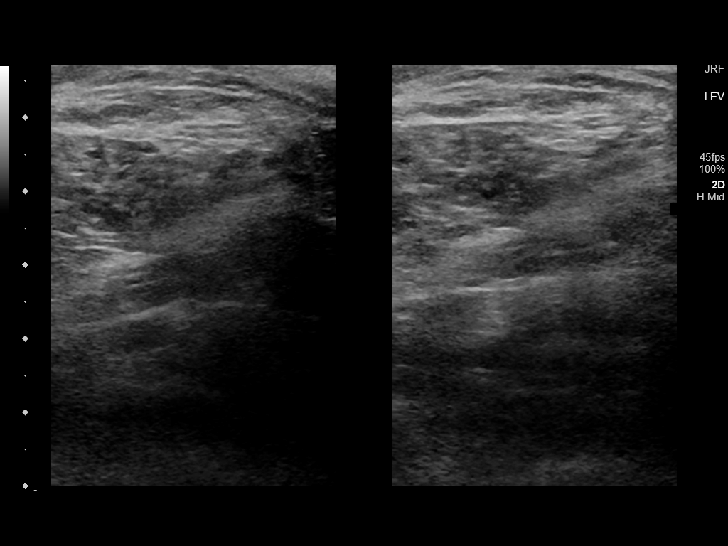
[im 28/58]
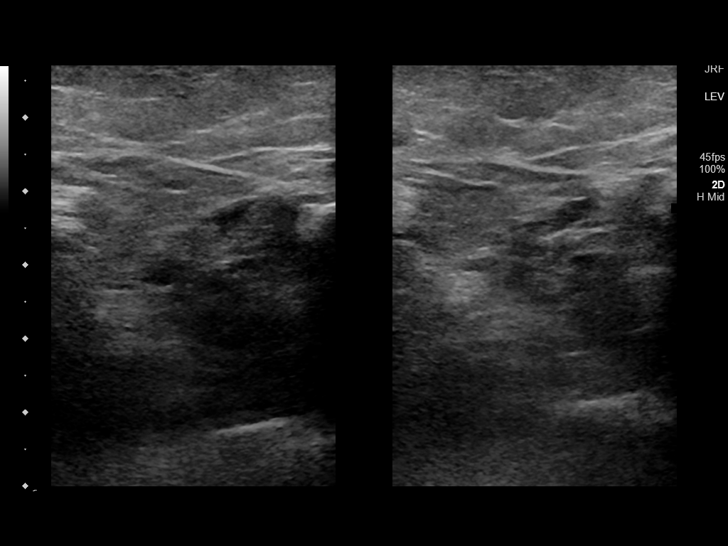
[im 30/58]
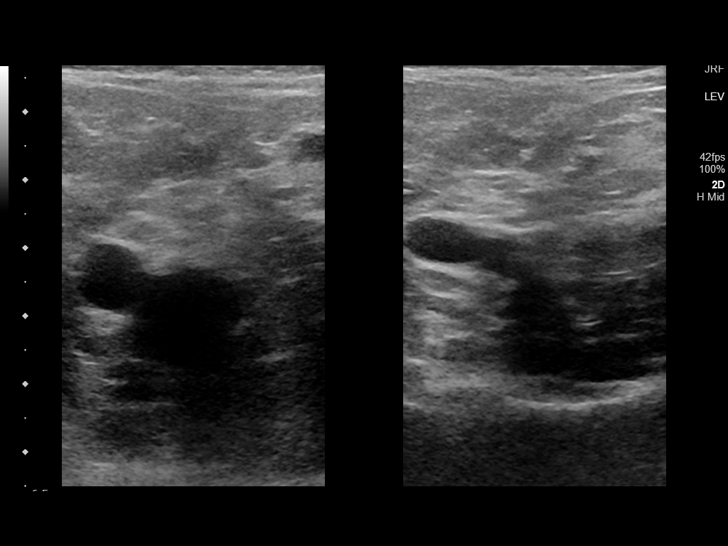
[im 35/58]
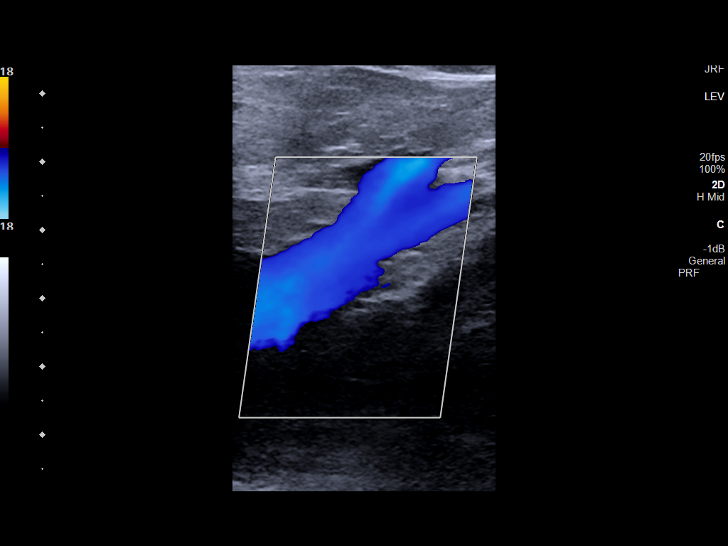
[im 40/58]
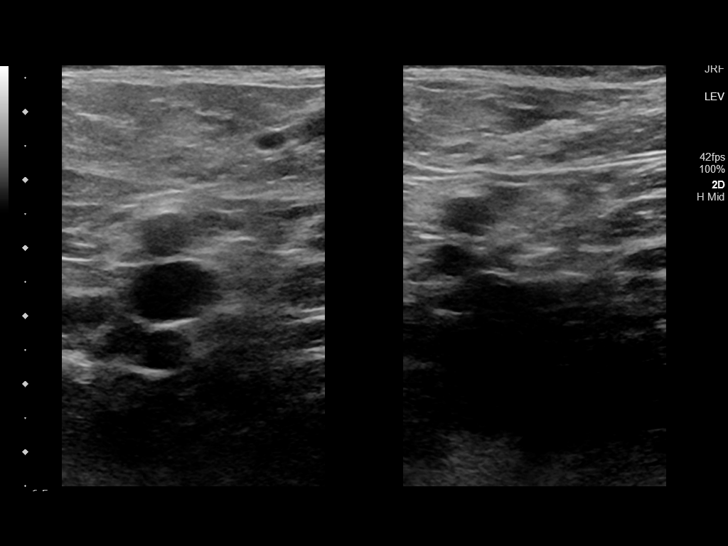
[im 45/58]
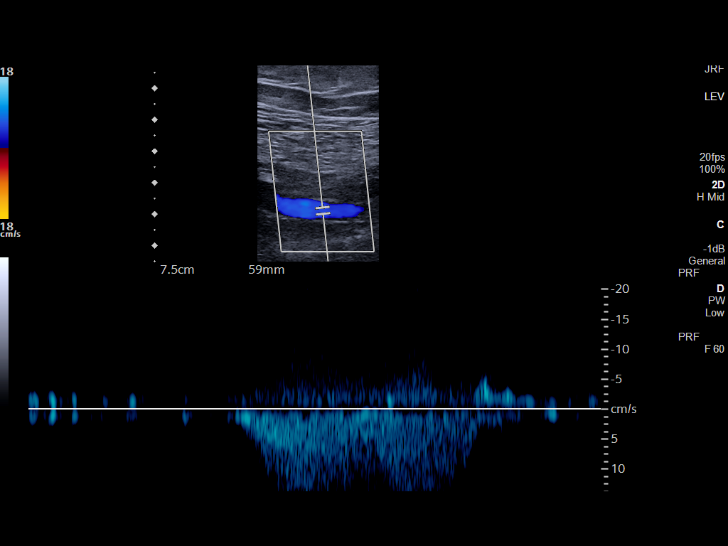
[im 48/58]
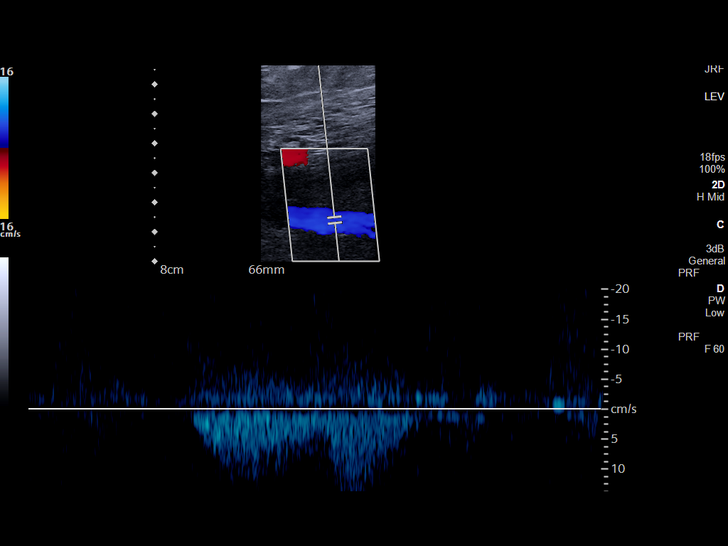
[im 53/58]
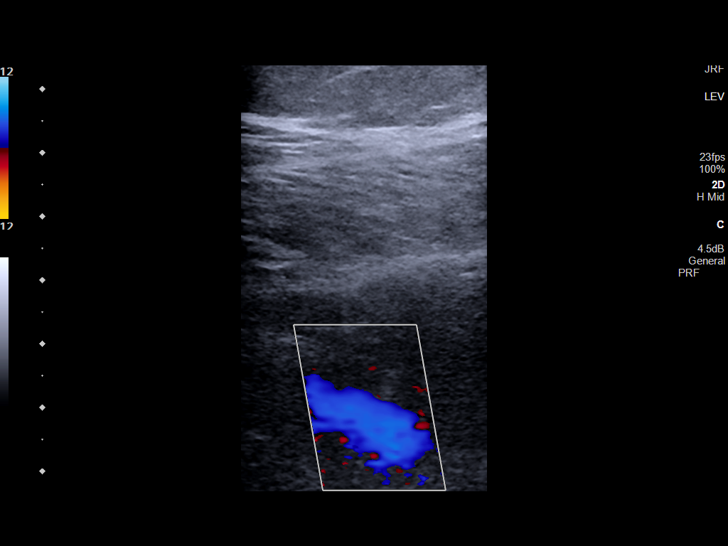
[im 58/58]
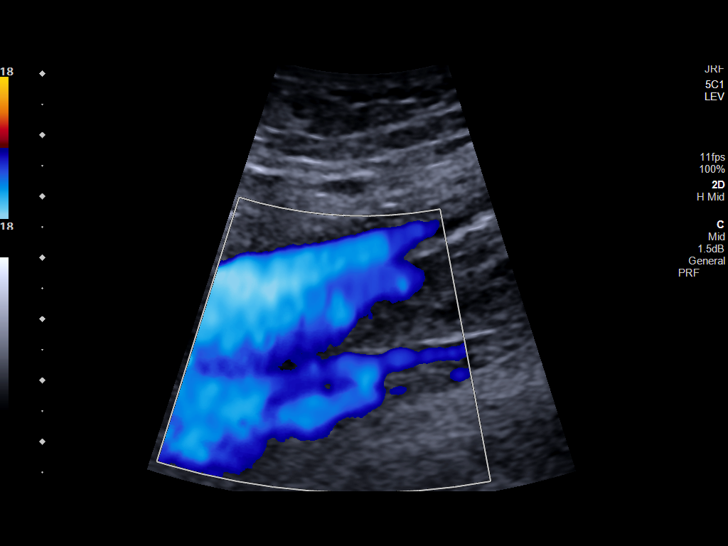

[14 of 24 positions shown; findings below may reference images not displayed]

FINDINGS: VENOUS

Normal compressibility of the common femoral, superficial femoral,
and popliteal veins, as well as the visualized calf veins.
Visualized portions of profunda femoral vein and great saphenous
vein unremarkable. No filling defects to suggest DVT on grayscale or
color Doppler imaging. Doppler waveforms show normal direction of
venous flow, normal respiratory plasticity and response to
augmentation.

OTHER

None.

Limitations: none
IMPRESSION: Negative.

## 2021-05-22 ENCOUNTER — Other Ambulatory Visit: Payer: Self-pay | Admitting: Internal Medicine

## 2021-05-22 DIAGNOSIS — J45909 Unspecified asthma, uncomplicated: Secondary | ICD-10-CM

## 2021-06-20 ENCOUNTER — Other Ambulatory Visit: Payer: Self-pay | Admitting: Nurse Practitioner

## 2021-06-20 ENCOUNTER — Ambulatory Visit: Payer: Medicaid Other | Admitting: Nurse Practitioner

## 2021-06-20 ENCOUNTER — Telehealth: Payer: Self-pay

## 2021-06-20 NOTE — Telephone Encounter (Signed)
Pt son advised  we can do HA1C in office next week and she don't need more labs for now  ?

## 2021-06-26 ENCOUNTER — Other Ambulatory Visit: Payer: Self-pay | Admitting: Internal Medicine

## 2021-06-27 ENCOUNTER — Ambulatory Visit: Payer: Medicaid Other | Admitting: Nurse Practitioner

## 2021-06-27 ENCOUNTER — Encounter: Payer: Self-pay | Admitting: Nurse Practitioner

## 2021-06-27 VITALS — BP 128/80 | HR 64 | Temp 98.3°F | Resp 16 | Ht 63.0 in | Wt 270.2 lb

## 2021-06-27 DIAGNOSIS — R7301 Impaired fasting glucose: Secondary | ICD-10-CM

## 2021-06-27 DIAGNOSIS — E1140 Type 2 diabetes mellitus with diabetic neuropathy, unspecified: Secondary | ICD-10-CM

## 2021-06-27 DIAGNOSIS — Z23 Encounter for immunization: Secondary | ICD-10-CM | POA: Diagnosis not present

## 2021-06-27 LAB — POCT GLYCOSYLATED HEMOGLOBIN (HGB A1C): Hemoglobin A1C: 6.7 % — AB (ref 4.0–5.6)

## 2021-06-27 MED ORDER — PNEUMOCOCCAL 20-VAL CONJ VACC 0.5 ML IM SUSY: 0.5000 mL | PREFILLED_SYRINGE | INTRAMUSCULAR | 0 refills | Status: AC

## 2021-06-27 NOTE — Progress Notes (Signed)
[facility] [place], [place] [address]  Internal MEDICINE  Office Visit Note  Patient Name: Brianna Gray  [id number]  [id number]  Date of Service: 06/27/2021  Chief Complaint  Patient presents with   Follow-up    Tingling and numbness on right leg   Hypertension   Asthma    HPI Brianna Gray presents for a follow-up visit for hypertension, asthma and neuropathy.  Patient has had routine labs drawn a couple of times in the last 12 months so we will wait for her next annual wellness visit to order those again. A1c checked today and is elevated at 6.7 which is in diabetic range.  Patient and family member would prefer to work on diet control first before starting any medications for it. Also patient does have mild peripheral neuropathy in her bilateral feet    Current Medication: Outpatient Encounter Medications as of 06/27/2021  Medication Sig   acetic acid-hydrocortisone (VOSOL-HC) OTIC solution Place 3 drops into both ears 2 (two) times daily. As needed for itching   ADVAIR DISKUS 250-50 MCG/ACT AEPB INHALE 1 PUFF INTO THE LUNGS 2 (TWO) TIMES DAILY AS NEEDED. FOR RESPIRATORY ISSUES.   amLODipine (NORVASC) 5 MG tablet Take 5 mg by mouth daily.   atorvastatin (LIPITOR) 10 MG tablet TAKE 1 TABLET BY MOUTH EVERYDAY AT BEDTIME   augmented betamethasone dipropionate (DIPROLENE) 0.05 % ointment Apply topically 2 (two) times daily.   ergocalciferol (VITAMIN D2) 1.25 MG (50000 UT) capsule Take 1 capsule (50,000 Units total) by mouth once a week.   esomeprazole (NEXIUM) 40 MG capsule TAKE 1 CAPSULE BY MOUTH EVERY DAY FOR STOMACH   fluticasone (FLONASE) 50 MCG/ACT nasal spray Place 1-2 sprays into both nostrils daily as needed. For nasal congestion.   gentamicin cream (GARAMYCIN) 0.1 % Apply 1 application topically 2 (two) times daily.   hydrALAZINE (APRESOLINE) 25 MG tablet Take 1 tablet (25 mg total) by mouth 3 (three) times daily.   ipratropium-albuterol (DUONEB) 0.5-2.5  (3) MG/3ML SOLN One vial tid prn for asthma, faillure to MDI   loratadine (CLARITIN) 10 MG tablet TAKE 1 TABLET BY MOUTH EVERY DAY   losartan (COZAAR) 100 MG tablet Take 1 tablet (100 mg total) by mouth daily.   meloxicam (MOBIC) 7.5 MG tablet TAKE 1 TABLET BY MOUTH TWICE A DAY FOR LOWER BACK AND JOINT PAINS   montelukast (SINGULAIR) 10 MG tablet Take 1 tablet (10 mg total) by mouth at bedtime. For sinus congestion and allergies   neomycin-polymyxin-hydrocortisone (CORTISPORIN) OTIC solution 3-4 drops in both ears bid prn   Potassium 99 MG TABS Take 99 mg by mouth daily at 12 noon.   torsemide (DEMADEX) 20 MG tablet Take 20 mg by mouth daily.   vitamin B-12 (CYANOCOBALAMIN) 1000 MCG tablet Take 1,000 mcg by mouth daily at 12 noon.   [DISCONTINUED] levothyroxine (SYNTHROID) 88 MCG tablet TAKE 1 TABLET BY MOUTH EVERY MORNING ON EMPTY STOMACH**OK TO CHANGE TO LANNETT PER MD**   [DISCONTINUED] pneumococcal 20-valent conjugate vaccine (PREVNAR 20) 0.5 ML injection Inject 0.5 mLs into the muscle tomorrow at 10 am.   [EXPIRED] pneumococcal 20-valent conjugate vaccine (PREVNAR 20) 0.5 ML injection Inject 0.5 mLs into the muscle tomorrow at 10 am for 1 dose.   No facility-administered encounter medications on file as of 06/27/2021.    Surgical History: Past Surgical History:  Procedure Laterality Date   CHOLECYSTECTOMY     HERNIA REPAIR     HERNIA REPAIR     HYSTEROSCOPY WITH D &  C N/A 03/19/2015   Procedure: DILATATION AND CURETTAGE /HYSTEROSCOPY and polypectomy;  Surgeon: Honor Loh Ward, MD;  Location: [facility];  Service: Gynecology;  Laterality: N/A;   LAPAROSCOPIC HYSTERECTOMY N/A 06/08/2016   Procedure: HYSTERECTOMY TOTAL LAPAROSCOPIC;  Surgeon: Gae Dry, MD;  Location: [facility];  Service: Gynecology;  Laterality: N/A;   LAPAROSCOPIC SALPINGO OOPHERECTOMY Bilateral 06/08/2016   Procedure: LAPAROSCOPIC SALPINGO OOPHORECTOMY;  Surgeon: Gae Dry, MD;  Location: [facility];  Service:  Gynecology;  Laterality: Bilateral;   LAPAROTOMY N/A 06/08/2016   Procedure: LAPAROTOMY;  Surgeon: Gae Dry, MD;  Location: [facility];  Service: Gynecology;  Laterality: N/A;    Medical History: Past Medical History:  Diagnosis Date   Asthma    Cardiomyopathy ([place])    Carpal tunnel syndrome    Carpal tunnel syndrome    Dizziness    Edema    Elevated lipids    GERD (gastroesophageal reflux disease)    Hypertension    Lower extremity edema    Seasonal allergies    Sleep apnea    Wheezing     Family History: Family History  Problem Relation Age of Onset   Diabetes Brother    Hypertension Brother    Breast cancer Neg Hx     Social History   Socioeconomic History   Marital status: Married    Spouse name: Not on file   Number of children: Not on file   Years of education: Not on file   Highest education level: Not on file  Occupational History   Not on file  Tobacco Use   Smoking status: Never   Smokeless tobacco: Current    Types: Chew  Substance and Sexual Activity   Alcohol use: No   Drug use: No   Sexual activity: Never  Other Topics Concern   Not on file  Social History Narrative   ** Merged History Encounter **       Social Determinants of Health   Financial Resource Strain: Not on file  Food Insecurity: Not on file  Transportation Needs: Not on file  Physical Activity: Not on file  Stress: Not on file  Social Connections: Not on file  Intimate Partner Violence: Not on file      Review of Systems  Constitutional:  Negative for chills, fatigue and unexpected weight change.  HENT:  Negative for congestion, rhinorrhea, sneezing and sore throat.   Eyes:  Negative for redness.  Respiratory:  Negative for cough, chest tightness and shortness of breath.   Cardiovascular:  Negative for chest pain and palpitations.  Gastrointestinal:  Negative for abdominal pain, constipation, diarrhea, nausea and vomiting.  Genitourinary:  Negative for dysuria  and frequency.  Musculoskeletal:  Negative for arthralgias, back pain, joint swelling and neck pain.       Pain, numbness and tingling in bilateral feet  Skin:  Negative for rash.  Neurological: Negative.  Negative for tremors and numbness.  Hematological:  Negative for adenopathy. Does not bruise/bleed easily.  Psychiatric/Behavioral:  Negative for behavioral problems (Depression), sleep disturbance and suicidal ideas. The patient is not nervous/anxious.     Vital Signs: BP 128/80   Pulse 64   Temp 98.3 F (36.8 C)   Resp 16   Ht '5\' 3"'$  (1.6 m)   Wt 270 lb 3.2 oz (122.6 kg)   SpO2 97%   BMI 47.86 kg/m    Physical Exam Vitals reviewed.  Constitutional:      General: She is not in acute  distress.    Appearance: Normal appearance. She is obese. She is not ill-appearing.  HENT:     Head: Normocephalic and atraumatic.  Eyes:     Pupils: Pupils are equal, round, and reactive to light.  Cardiovascular:     Rate and Rhythm: Normal rate and regular rhythm.  Pulmonary:     Effort: Pulmonary effort is normal. No respiratory distress.  Neurological:     Mental Status: She is alert and oriented to person, place, and time.     Cranial Nerves: No cranial nerve deficit.  Psychiatric:        Mood and Affect: Mood normal.        Behavior: Behavior normal.        Assessment/Plan: 1. IFG (impaired fasting glucose) A1c checked it was elevated at 6.7 - POCT HgB A1C  2. Type 2 diabetes mellitus with diabetic neuropathy, without long-term current use of insulin (HCC) A and A1c of 6.7 is considered diabetic and patient also has signs of peripheral neuropathy in her feet.  She is not on any medications currently for diabetes and would prefer to attempt to control her sugars with diet first.  She does have a way of checking her sugars at home at this time - POCT HgB A1C  3. Need for vaccination 90 due - pneumococcal 20-valent conjugate vaccine (PREVNAR 20) 0.5 ML injection; Inject 0.5  mLs into the muscle tomorrow at 10 am for 1 dose.  Dispense: 0.5 mL; Refill: 0   General Counseling: Othello verbalizes understanding of the findings of todays visit and agrees with plan of treatment. I have discussed any further diagnostic evaluation that may be needed or ordered today. We also reviewed her medications today. she has been encouraged to call the office with any questions or concerns that should arise related to todays visit.    Orders Placed This Encounter  Procedures   POCT HgB A1C    Meds ordered this encounter  Medications   pneumococcal 20-valent conjugate vaccine (PREVNAR 20) 0.5 ML injection    Sig: Inject 0.5 mLs into the muscle tomorrow at 10 am for 1 dose.    Dispense:  0.5 mL    Refill:  0    Return for previously scheduled in august.   Total time spent: 30 minutes Time spent includes review of chart, medications, test results, and follow up plan with the patient.   [place] Controlled Substance Database was reviewed by me.  This patient was seen by Jonetta Osgood, FNP-C in collaboration with Dr. Clayborn Bigness as a part of collaborative care agreement.   Luzmaria Devaux R. Valetta Fuller, MSN, FNP-C Internal medicine

## 2021-07-20 ENCOUNTER — Other Ambulatory Visit: Payer: Self-pay | Admitting: Internal Medicine

## 2021-09-02 ENCOUNTER — Other Ambulatory Visit: Payer: Self-pay

## 2021-09-02 MED ORDER — MECLIZINE HCL 25 MG PO TABS
25.0000 mg | ORAL_TABLET | Freq: Two times a day (BID) | ORAL | 1 refills | Status: AC | PRN
Start: 2021-09-02 — End: ?

## 2021-09-21 ENCOUNTER — Encounter: Payer: Medicaid Other | Admitting: Nurse Practitioner

## 2021-10-03 ENCOUNTER — Encounter: Payer: Medicaid Other | Admitting: Nurse Practitioner

## 2021-10-12 ENCOUNTER — Encounter: Payer: Self-pay | Admitting: Nurse Practitioner

## 2021-10-12 ENCOUNTER — Ambulatory Visit (INDEPENDENT_AMBULATORY_CARE_PROVIDER_SITE_OTHER): Payer: Medicaid Other | Admitting: Nurse Practitioner

## 2021-10-12 VITALS — BP 138/71 | HR 67 | Temp 97.9°F | Resp 16 | Ht 63.0 in | Wt 261.6 lb

## 2021-10-12 DIAGNOSIS — Z0001 Encounter for general adult medical examination with abnormal findings: Secondary | ICD-10-CM | POA: Diagnosis not present

## 2021-10-12 DIAGNOSIS — R1012 Left upper quadrant pain: Secondary | ICD-10-CM

## 2021-10-12 DIAGNOSIS — M1711 Unilateral primary osteoarthritis, right knee: Secondary | ICD-10-CM

## 2021-10-12 DIAGNOSIS — E1140 Type 2 diabetes mellitus with diabetic neuropathy, unspecified: Secondary | ICD-10-CM

## 2021-10-12 DIAGNOSIS — E2839 Other primary ovarian failure: Secondary | ICD-10-CM

## 2021-10-12 DIAGNOSIS — E559 Vitamin D deficiency, unspecified: Secondary | ICD-10-CM

## 2021-10-12 DIAGNOSIS — E782 Mixed hyperlipidemia: Secondary | ICD-10-CM

## 2021-10-12 DIAGNOSIS — E039 Hypothyroidism, unspecified: Secondary | ICD-10-CM

## 2021-10-12 DIAGNOSIS — Z1231 Encounter for screening mammogram for malignant neoplasm of breast: Secondary | ICD-10-CM

## 2021-10-12 DIAGNOSIS — R300 Dysuria: Secondary | ICD-10-CM

## 2021-10-12 DIAGNOSIS — Z780 Asymptomatic menopausal state: Secondary | ICD-10-CM

## 2021-10-12 LAB — POCT GLYCOSYLATED HEMOGLOBIN (HGB A1C): Hemoglobin A1C: 6.6 % — AB (ref 4.0–5.6)

## 2021-10-12 NOTE — Progress Notes (Signed)
Pt was given coupon for free Accu-Check Guide Blood glucose monitor to take to pharmacy.  Pt also given a Diabetes Meal Planning Guide to help keep blood sugars low.  Given to pt's son and went over the pamphlet with him.

## 2021-10-12 NOTE — Progress Notes (Signed)
[facility] [place], [place] [address]  Internal MEDICINE  Office Visit Note  Patient Name: Brianna Gray  [id number]  [id number]  Date of Service: 10/12/2021  Chief Complaint  Patient presents with   Annual Exam   Diabetes   Hypothyroidism    HPI Brianna Gray presents for an annual well visit and physical exam.  Well-appearing 71 year old female with hypertension, OSA, GERD, osteoarthritis, hyperlipidemia and diabetes. --overdue for mammogram and DEXA scan --declined colorectal cancer screening for now.  --due for routine labs --A1C slightly improved, 6.6 today. Has lost 9 lbs since previous office visit.  --Itching and pain LUQ-- started about a year ago. Had elevated enzymes on prior labs  --BP is controlled with current medications --denies any new or worsening pain   Current Medication: Outpatient Encounter Medications as of 10/12/2021  Medication Sig   Accu-Chek Softclix Lancets lancets in the morning and at bedtime. Use to check blood sugars twice a day.  E11.65   acetic acid-hydrocortisone (VOSOL-HC) OTIC solution Place 3 drops into both ears 2 (two) times daily. As needed for itching   ADVAIR DISKUS 250-50 MCG/ACT AEPB INHALE 1 PUFF INTO THE LUNGS 2 (TWO) TIMES DAILY AS NEEDED. FOR RESPIRATORY ISSUES.   amLODipine (NORVASC) 5 MG tablet Take 5 mg by mouth daily.   atorvastatin (LIPITOR) 10 MG tablet TAKE 1 TABLET BY MOUTH EVERYDAY AT BEDTIME   augmented betamethasone dipropionate (DIPROLENE) 0.05 % ointment Apply topically 2 (two) times daily.   Blood Glucose Monitoring Suppl (ACCU-CHEK GUIDE ME) w/Device KIT 1 each by Does not apply route. Use to check blood sugars twice a day.    E11.65   Continuous Blood Gluc Receiver (FREESTYLE LIBRE 14 DAY READER) DEVI 1 each by Does not apply route every 14 (fourteen) days. Use every 14 days to monitor blood sugars   E11.65   Continuous Blood Gluc Sensor (FREESTYLE LIBRE 14 DAY SENSOR) MISC 1 each by Does not  apply route every 14 (fourteen) days. Use every 14 days to monitor blood sugars   E11.65   ergocalciferol (VITAMIN D2) 1.25 MG (50000 UT) capsule Take 1 capsule (50,000 Units total) by mouth once a week.   esomeprazole (NEXIUM) 40 MG capsule TAKE 1 CAPSULE BY MOUTH EVERY DAY FOR STOMACH   fluticasone (FLONASE) 50 MCG/ACT nasal spray Place 1-2 sprays into both nostrils daily as needed. For nasal congestion.   gentamicin cream (GARAMYCIN) 0.1 % Apply 1 application topically 2 (two) times daily.   glucose blood (ACCU-CHEK GUIDE) test strip in the morning and at bedtime. Use as instructed to check blood sugars twice a day  E11.65   ipratropium-albuterol (DUONEB) 0.5-2.5 (3) MG/3ML SOLN One vial tid prn for asthma, faillure to MDI   levothyroxine (SYNTHROID) 88 MCG tablet TAKE 1 TABLET BY MOUTH EVERY MORNING ON EMPTY STOMACH**OK TO CHANGE TO LANNETT PER MD**   loratadine (CLARITIN) 10 MG tablet TAKE 1 TABLET BY MOUTH EVERY DAY   losartan (COZAAR) 100 MG tablet Take 1 tablet (100 mg total) by mouth daily.   meclizine (ANTIVERT) 25 MG tablet Take 1 tablet (25 mg total) by mouth 2 (two) times daily as needed for dizziness.   meloxicam (MOBIC) 7.5 MG tablet TAKE 1 TABLET BY MOUTH TWICE A DAY FOR LOWER BACK AND JOINT PAINS   montelukast (SINGULAIR) 10 MG tablet Take 1 tablet (10 mg total) by mouth at bedtime. For sinus congestion and allergies   neomycin-polymyxin-hydrocortisone (CORTISPORIN) OTIC solution 3-4 drops in both ears  bid prn   Potassium 99 MG TABS Take 99 mg by mouth daily at 12 noon.   torsemide (DEMADEX) 20 MG tablet Take 20 mg by mouth daily.   vitamin B-12 (CYANOCOBALAMIN) 1000 MCG tablet Take 1,000 mcg by mouth daily at 12 noon.   [DISCONTINUED] hydrALAZINE (APRESOLINE) 25 MG tablet Take 1 tablet (25 mg total) by mouth 3 (three) times daily.   No facility-administered encounter medications on file as of 10/12/2021.    Surgical History: Past Surgical History:  Procedure Laterality Date    CHOLECYSTECTOMY     HERNIA REPAIR     HERNIA REPAIR     HYSTEROSCOPY WITH D & C N/A 03/19/2015   Procedure: DILATATION AND CURETTAGE /HYSTEROSCOPY and polypectomy;  Surgeon: Honor Loh Ward, MD;  Location: [facility];  Service: Gynecology;  Laterality: N/A;   LAPAROSCOPIC HYSTERECTOMY N/A 06/08/2016   Procedure: HYSTERECTOMY TOTAL LAPAROSCOPIC;  Surgeon: Gae Dry, MD;  Location: [facility];  Service: Gynecology;  Laterality: N/A;   LAPAROSCOPIC SALPINGO OOPHERECTOMY Bilateral 06/08/2016   Procedure: LAPAROSCOPIC SALPINGO OOPHORECTOMY;  Surgeon: Gae Dry, MD;  Location: [facility];  Service: Gynecology;  Laterality: Bilateral;   LAPAROTOMY N/A 06/08/2016   Procedure: LAPAROTOMY;  Surgeon: Gae Dry, MD;  Location: [facility];  Service: Gynecology;  Laterality: N/A;    Medical History: Past Medical History:  Diagnosis Date   Asthma    Cardiomyopathy ([place])    Carpal tunnel syndrome    Carpal tunnel syndrome    Dizziness    Edema    Elevated lipids    GERD (gastroesophageal reflux disease)    Hypertension    Lower extremity edema    Seasonal allergies    Sleep apnea    Wheezing     Family History: Family History  Problem Relation Age of Onset   Diabetes Brother    Hypertension Brother    Breast cancer Neg Hx     Social History   Socioeconomic History   Marital status: Married    Spouse name: Not on file   Number of children: Not on file   Years of education: Not on file   Highest education level: Not on file  Occupational History   Not on file  Tobacco Use   Smoking status: Never   Smokeless tobacco: Current    Types: Chew  Substance and Sexual Activity   Alcohol use: No   Drug use: No   Sexual activity: Never  Other Topics Concern   Not on file  Social History Narrative   ** Merged History Encounter **       Social Determinants of Health   Financial Resource Strain: Not on file  Food Insecurity: Not on file  Transportation Needs: Not on file   Physical Activity: Not on file  Stress: Not on file  Social Connections: Not on file  Intimate Partner Violence: Not on file      Review of Systems  Constitutional:  Negative for activity change, appetite change, chills, fatigue, fever and unexpected weight change.  HENT: Negative.  Negative for congestion, ear pain, rhinorrhea, sore throat and trouble swallowing.   Eyes: Negative.   Respiratory: Negative.  Negative for cough, chest tightness, shortness of breath and wheezing.   Cardiovascular: Negative.  Negative for chest pain.  Gastrointestinal: Negative.  Negative for abdominal pain, blood in stool, constipation, diarrhea, nausea and vomiting.  Endocrine: Negative.   Genitourinary: Negative.  Negative for difficulty urinating, dysuria, frequency, hematuria and urgency.  Musculoskeletal: Negative.  Negative for arthralgias, back pain, joint swelling, myalgias and neck pain.  Skin: Negative.  Negative for rash and wound.  Allergic/Immunologic: Negative.  Negative for immunocompromised state.  Neurological: Negative.  Negative for dizziness, seizures, numbness and headaches.  Hematological: Negative.   Psychiatric/Behavioral: Negative.  Negative for behavioral problems, self-injury and suicidal ideas. The patient is not nervous/anxious.     Vital Signs: BP 138/71   Pulse 67   Temp 97.9 F (36.6 C)   Resp 16   Ht _0  (1.6 m)   Wt 261 lb 9.6 oz (118.7 kg)   SpO2 96%   BMI 46.34 kg/m    Physical Exam Vitals reviewed.  Constitutional:      General: She is not in acute distress.    Appearance: Normal appearance. She is obese. She is not ill-appearing.  HENT:     Head: Normocephalic and atraumatic.     Right Ear: Tympanic membrane, ear canal and external ear normal.     Left Ear: Tympanic membrane, ear canal and external ear normal.     Nose: Nose normal. No congestion or rhinorrhea.     Mouth/Throat:     Mouth: Mucous membranes are moist.     Pharynx: Oropharynx is  clear. No oropharyngeal exudate or posterior oropharyngeal erythema.  Eyes:     Extraocular Movements: Extraocular movements intact.     Conjunctiva/sclera: Conjunctivae normal.     Pupils: Pupils are equal, round, and reactive to light.  Cardiovascular:     Rate and Rhythm: Normal rate and regular rhythm.     Pulses: Normal pulses.     Heart sounds: Normal heart sounds. No murmur heard.    No friction rub. No gallop.  Pulmonary:     Effort: Pulmonary effort is normal. No respiratory distress.     Breath sounds: Normal breath sounds.  Abdominal:     General: Bowel sounds are normal. There is no distension.     Palpations: Abdomen is soft. There is no mass.     Tenderness: There is no abdominal tenderness. There is no guarding or rebound.     Hernia: No hernia is present.  Musculoskeletal:        General: Normal range of motion.     Cervical back: Normal range of motion and neck supple.  Skin:    General: Skin is warm and dry.     Capillary Refill: Capillary refill takes less than 2 seconds.  Neurological:     Mental Status: She is alert and oriented to person, place, and time.  Psychiatric:        Mood and Affect: Mood normal.        Behavior: Behavior normal.        Thought Content: Thought content normal.        Judgment: Judgment normal.        Assessment/Plan: 1. Encounter for general adult medical examination with abnormal findings Age-appropriate preventive screenings and vaccinations discussed, annual physical exam completed. Routine labs for health maintenance ordered, see below. PHM updated.  - CBC with Differential/Platelet - CMP14+EGFR - Lipid Profile - TSH + free T4 - Vitamin D (25 hydroxy) - B12 and Folate Panel  2. LUQ pain Liver function testing ordered with routine labs as well as ultrasound of LUQ, will follow up with results and discuss with patient in office  - US Abdomen Complete; Future  3. Type 2 diabetes mellitus with diabetic neuropathy,  without long-term current use of insulin (HCC) A1C 6.6, slight  improvement, refills ordered, routine labs ordered. Repeat a1c in 3 months. Patient provided with voucher for new glucose meter to take to pharmacy and also a diabetes meal planning guide to help her to get control of her glucose level.  - POCT glycosylated hemoglobin (Hb A1C) - Blood Glucose Monitoring Suppl (ACCU-CHEK GUIDE ME) w/Device KIT; 1 each by Does not apply route. Use to check blood sugars twice a day.    E11.65 - glucose blood (ACCU-CHEK GUIDE) test strip; in the morning and at bedtime. Use as instructed to check blood sugars twice a day  E11.65 - Accu-Chek Softclix Lancets lancets; in the morning and at bedtime. Use to check blood sugars twice a day.  E11.65 - Continuous Blood Gluc Receiver (FREESTYLE LIBRE 14 DAY READER) DEVI; 1 each by Does not apply route every 14 (fourteen) days. Use every 14 days to monitor blood sugars   E11.65 - Continuous Blood Gluc Sensor (FREESTYLE LIBRE 14 DAY SENSOR) MISC; 1 each by Does not apply route every 14 (fourteen) days. Use every 14 days to monitor blood sugars   E11.65 - CBC with Differential/Platelet - CMP14+EGFR - Lipid Profile - TSH + free T4  4. Acquired hypothyroidism Routine labs ordered - CBC with Differential/Platelet - CMP14+EGFR - Lipid Profile - TSH + free T4  5. Mixed hyperlipidemia Routine labs ordered - CMP14+EGFR - Lipid Profile - TSH + free T4  6. Vitamin D deficiency Routine labs ordered - Vitamin D (25 hydroxy)  7. Dysuria Routine urinalysis - UA/M w/rflx Culture, Routine - Microscopic Examination - Urine Culture, Reflex  8. Other primary ovarian failure DEXA scan ordered, patient's son will call and have scheduled with [facility] - DG Bone Density; Future  9. Encounter for screening mammogram for malignant neoplasm of breast Mammogram ordered, patient's son will call to schedule with [facility] - MM 3D SCREEN BREAST BILATERAL;  Future      General Counseling: Orissa verbalizes understanding of the findings of todays visit and agrees with plan of treatment. I have discussed any further diagnostic evaluation that may be needed or ordered today. We also reviewed her medications today. she has been encouraged to call the office with any questions or concerns that should arise related to todays visit.    Orders Placed This Encounter  Procedures   Microscopic Examination   Urine Culture, Reflex   US Abdomen Complete   DG Bone Density   MM 3D SCREEN BREAST BILATERAL   UA/M w/rflx Culture, Routine   CBC with Differential/Platelet   CMP14+EGFR   Lipid Profile   TSH + free T4   Vitamin D (25 hydroxy)   B12 and Folate Panel   POCT glycosylated hemoglobin (Hb A1C)    No orders of the defined types were placed in this encounter.   Return in about 3 months (around 01/12/2022) for F/U, Recheck A1C, Leilanny Fluitt PCP.   Total time spent:30 Minutes Time spent includes review of chart, medications, test results, and follow up plan with the patient.   [place] Controlled Substance Database was reviewed by me.  This patient was seen by Jonetta Osgood, FNP-C in collaboration with Dr. Clayborn Bigness as a part of collaborative care agreement.  Terril Chestnut R. Valetta Fuller, MSN, FNP-C Internal medicine

## 2021-10-17 LAB — UA/M W/RFLX CULTURE, ROUTINE
Bilirubin, UA: NEGATIVE
Glucose, UA: NEGATIVE
Ketones, UA: NEGATIVE
Leukocytes,UA: NEGATIVE
Nitrite, UA: NEGATIVE
RBC, UA: NEGATIVE
Specific Gravity, UA: 1.024 (ref 1.005–1.030)
Urobilinogen, Ur: 0.2 mg/dL (ref 0.2–1.0)
pH, UA: 5.5 (ref 5.0–7.5)

## 2021-10-17 LAB — MICROSCOPIC EXAMINATION
Casts: NONE SEEN /lpf
RBC, Urine: NONE SEEN /hpf (ref 0–2)

## 2021-10-17 LAB — URINE CULTURE, REFLEX

## 2021-10-19 LAB — CMP14+EGFR
ALT: 54 IU/L — ABNORMAL HIGH (ref 0–32)
AST: 60 IU/L — ABNORMAL HIGH (ref 0–40)
Albumin/Globulin Ratio: 1.2 (ref 1.2–2.2)
Albumin: 4.1 g/dL (ref 3.8–4.8)
Alkaline Phosphatase: 115 IU/L (ref 44–121)
BUN/Creatinine Ratio: 16 (ref 12–28)
BUN: 17 mg/dL (ref 8–27)
Bilirubin Total: 0.2 mg/dL (ref 0.0–1.2)
CO2: 22 mmol/L (ref 20–29)
Calcium: 9.9 mg/dL (ref 8.7–10.3)
Chloride: 102 mmol/L (ref 96–106)
Creatinine, Ser: 1.04 mg/dL — ABNORMAL HIGH (ref 0.57–1.00)
Globulin, Total: 3.3 g/dL (ref 1.5–4.5)
Glucose: 126 mg/dL — ABNORMAL HIGH (ref 70–99)
Potassium: 4 mmol/L (ref 3.5–5.2)
Sodium: 139 mmol/L (ref 134–144)
Total Protein: 7.4 g/dL (ref 6.0–8.5)
eGFR: 57 mL/min/{1.73_m2} — ABNORMAL LOW (ref >59)

## 2021-10-19 LAB — LIPID PANEL
Chol/HDL Ratio: 3.7 ratio (ref 0.0–4.4)
Cholesterol, Total: 161 mg/dL (ref 100–199)
HDL: 43 mg/dL (ref >39)
LDL Chol Calc (NIH): 76 mg/dL (ref 0–99)
Triglycerides: 260 mg/dL — ABNORMAL HIGH (ref 0–149)
VLDL Cholesterol Cal: 42 mg/dL — ABNORMAL HIGH (ref 5–40)

## 2021-10-19 LAB — CBC WITH DIFFERENTIAL/PLATELET
Basophils Absolute: 0 10*3/uL (ref 0.0–0.2)
Basos: 0 %
EOS (ABSOLUTE): 0.3 10*3/uL (ref 0.0–0.4)
Eos: 3 %
Hematocrit: 36.4 % (ref 34.0–46.6)
Hemoglobin: 11.5 g/dL (ref 11.1–15.9)
Immature Grans (Abs): 0 10*3/uL (ref 0.0–0.1)
Immature Granulocytes: 0 %
Lymphocytes Absolute: 2.2 10*3/uL (ref 0.7–3.1)
Lymphs: 22 %
MCH: 25.7 pg — ABNORMAL LOW (ref 26.6–33.0)
MCHC: 31.6 g/dL (ref 31.5–35.7)
MCV: 81 fL (ref 79–97)
Monocytes Absolute: 0.8 10*3/uL (ref 0.1–0.9)
Monocytes: 8 %
Neutrophils Absolute: 6.7 10*3/uL (ref 1.4–7.0)
Neutrophils: 67 %
Platelets: 301 10*3/uL (ref 150–450)
RBC: 4.47 x10E6/uL (ref 3.77–5.28)
RDW: 14.2 % (ref 11.7–15.4)
WBC: 10.1 10*3/uL (ref 3.4–10.8)

## 2021-10-19 LAB — TSH+FREE T4
Free T4: 1.25 ng/dL (ref 0.82–1.77)
TSH: 2.04 u[IU]/mL (ref 0.450–4.500)

## 2021-10-19 LAB — B12 AND FOLATE PANEL
Folate: 9.5 ng/mL (ref >3.0)
Vitamin B-12: 1585 pg/mL — ABNORMAL HIGH (ref 232–1245)

## 2021-10-19 LAB — VITAMIN D 25 HYDROXY (VIT D DEFICIENCY, FRACTURES): Vit D, 25-Hydroxy: 12.2 ng/mL — ABNORMAL LOW (ref 30.0–100.0)

## 2021-10-31 ENCOUNTER — Other Ambulatory Visit: Payer: Self-pay | Admitting: Internal Medicine

## 2021-11-07 ENCOUNTER — Ambulatory Visit: Payer: Medicaid Other

## 2021-11-07 DIAGNOSIS — R1012 Left upper quadrant pain: Secondary | ICD-10-CM | POA: Diagnosis not present

## 2021-11-22 ENCOUNTER — Ambulatory Visit: Payer: Medicaid Other | Admitting: Internal Medicine

## 2021-11-22 ENCOUNTER — Encounter: Payer: Self-pay | Admitting: Internal Medicine

## 2021-11-22 ENCOUNTER — Encounter: Payer: Self-pay | Admitting: Nurse Practitioner

## 2021-11-22 VITALS — BP 137/62 | HR 72 | Temp 97.5°F | Resp 16 | Ht 63.0 in | Wt 262.2 lb

## 2021-11-22 DIAGNOSIS — K7581 Nonalcoholic steatohepatitis (NASH): Secondary | ICD-10-CM | POA: Diagnosis not present

## 2021-11-22 DIAGNOSIS — E559 Vitamin D deficiency, unspecified: Secondary | ICD-10-CM

## 2021-11-22 DIAGNOSIS — E1165 Type 2 diabetes mellitus with hyperglycemia: Secondary | ICD-10-CM | POA: Diagnosis not present

## 2021-11-22 DIAGNOSIS — Z6841 Body Mass Index (BMI) 40.0 and over, adult: Secondary | ICD-10-CM | POA: Diagnosis not present

## 2021-11-22 NOTE — Progress Notes (Unsigned)
Pt given sample of OZEMPIC in office and was given instructions on how to administer the injection.  Pt's son was in the room also and was interpreting for the patient.  After explaining the instructions, I had patient give herself a injection of Ozempic 0.25 mg.  I also explained to pt and son that pt is to go up on the dose to .50 next Tuesday 11/22/21 until instructed by Dr Humphrey Rolls on next dose.

## 2021-11-22 NOTE — Progress Notes (Unsigned)
[facility] [place], [place] [address]  Internal MEDICINE  Office Visit Note  Patient Name: Brianna Gray  [id number]  [id number]  Date of Service: 11/23/2021  Chief Complaint  Patient presents with   Follow-up    Review U/S   Gastroesophageal Reflux   Hypertension    HPI Pt is in the room with her son Is abdominal ultrasound shows moderate to hepatomegaly, NASH.  LFTs are normal patient is also on Lipitor Glucose was also elevated recent hemoglobin A1c is higher than before 6.7 patient is a diabetic this point Blood pressure is under reasonable control She is watching her diet and able to lose about 10 pounds in the last 6 months    Current Medication: Outpatient Encounter Medications as of 11/22/2021  Medication Sig   Accu-Chek Softclix Lancets lancets in the morning and at bedtime. Use to check blood sugars twice a day.  E11.65   acetic acid-hydrocortisone (VOSOL-HC) OTIC solution Place 3 drops into both ears 2 (two) times daily. As needed for itching   ADVAIR DISKUS 250-50 MCG/ACT AEPB INHALE 1 PUFF INTO THE LUNGS 2 (TWO) TIMES DAILY AS NEEDED. FOR RESPIRATORY ISSUES.   amLODipine (NORVASC) 5 MG tablet Take 5 mg by mouth daily.   atorvastatin (LIPITOR) 10 MG tablet TAKE 1 TABLET BY MOUTH EVERYDAY AT BEDTIME   augmented betamethasone dipropionate (DIPROLENE) 0.05 % ointment Apply topically 2 (two) times daily.   Blood Glucose Monitoring Suppl (ACCU-CHEK GUIDE ME) w/Device KIT 1 each by Does not apply route. Use to check blood sugars twice a day.    E11.65   Continuous Blood Gluc Receiver (FREESTYLE LIBRE 14 DAY READER) DEVI 1 each by Does not apply route every 14 (fourteen) days. Use every 14 days to monitor blood sugars   E11.65   Continuous Blood Gluc Sensor (FREESTYLE LIBRE 14 DAY SENSOR) MISC 1 each by Does not apply route every 14 (fourteen) days. Use every 14 days to monitor blood sugars   E11.65   ergocalciferol (VITAMIN D2) 1.25 MG (50000 UT)  capsule Take 1 capsule (50,000 Units total) by mouth once a week.   esomeprazole (NEXIUM) 40 MG capsule TAKE 1 CAPSULE BY MOUTH EVERY DAY FOR STOMACH   fluticasone (FLONASE) 50 MCG/ACT nasal spray Place 1-2 sprays into both nostrils daily as needed. For nasal congestion.   gentamicin cream (GARAMYCIN) 0.1 % Apply 1 application topically 2 (two) times daily.   glucose blood (ACCU-CHEK GUIDE) test strip in the morning and at bedtime. Use as instructed to check blood sugars twice a day  E11.65   hydrALAZINE (APRESOLINE) 25 MG tablet TAKE 1 TABLET BY MOUTH THREE TIMES DAILY   ipratropium-albuterol (DUONEB) 0.5-2.5 (3) MG/3ML SOLN One vial tid prn for asthma, faillure to MDI   levothyroxine (SYNTHROID) 88 MCG tablet TAKE 1 TABLET BY MOUTH EVERY MORNING ON EMPTY STOMACH**OK TO CHANGE TO LANNETT PER MD**   loratadine (CLARITIN) 10 MG tablet TAKE 1 TABLET BY MOUTH EVERY DAY   losartan (COZAAR) 100 MG tablet Take 1 tablet (100 mg total) by mouth daily.   meclizine (ANTIVERT) 25 MG tablet Take 1 tablet (25 mg total) by mouth 2 (two) times daily as needed for dizziness.   meloxicam (MOBIC) 7.5 MG tablet TAKE 1 TABLET BY MOUTH TWICE A DAY FOR LOWER BACK AND JOINT PAINS   montelukast (SINGULAIR) 10 MG tablet Take 1 tablet (10 mg total) by mouth at bedtime. For sinus congestion and allergies   neomycin-polymyxin-hydrocortisone (CORTISPORIN) OTIC solution 3-4  drops in both ears bid prn   Potassium 99 MG TABS Take 99 mg by mouth daily at 12 noon.   torsemide (DEMADEX) 20 MG tablet Take 20 mg by mouth daily.   vitamin B-12 (CYANOCOBALAMIN) 1000 MCG tablet Take 1,000 mcg by mouth daily at 12 noon.   No facility-administered encounter medications on file as of 11/22/2021.    Surgical History: Past Surgical History:  Procedure Laterality Date   CHOLECYSTECTOMY     HERNIA REPAIR     HERNIA REPAIR     HYSTEROSCOPY WITH D & C N/A 03/19/2015   Procedure: DILATATION AND CURETTAGE /HYSTEROSCOPY and polypectomy;   Surgeon: Honor Loh Ward, MD;  Location: [facility] ORS;  Service: Gynecology;  Laterality: N/A;   LAPAROSCOPIC HYSTERECTOMY N/A 06/08/2016   Procedure: HYSTERECTOMY TOTAL LAPAROSCOPIC;  Surgeon: Gae Dry, MD;  Location: [facility] OR[facility];  Service: Gynecology;  Laterality: N/A;   LAPAROSCOPIC SALPINGO OOPHERECTOMY Bilateral 06/08/2016   Procedure: LAPAROSCOPIC SALPINGO OOPHORECTOMY;  Surgeon: Gae Dry, MD;  Location: [facility] ORS;  Service: Gynecology;  Laterality: Bilateral;   LAPAROTOMY N/A 06/08/2016   Procedure: LAPAROTOMY;  Surgeon: Gae Dry, MD;  Location: [facility] OR[facility];  Service: Gynecology;  Laterality: N/A;    Medical History: Past Medical History:  Diagnosis Date   Asthma    Cardiomyopathy ([place])    Carpal tunnel syndrome    Carpal tunnel syndrome    Dizziness    Edema    Elevated lipids    GERD (gastroesophageal reflux disease)    Hypertension    Lower extremity edema    Seasonal allergies    Sleep apnea    Wheezing     Family History: Family History  Problem Relation Age of Onset   Diabetes Brother    Hypertension Brother    Breast cancer Neg Hx     Social History   Socioeconomic History   Marital status: Married    Spouse name: Not on file   Number of children: Not on file   Years of education: Not on file   Highest education level: Not on file  Occupational History   Not on file  Tobacco Use   Smoking status: Never   Smokeless tobacco: Current    Types: Chew  Substance and Sexual Activity   Alcohol use: No   Drug use: No   Sexual activity: Never  Other Topics Concern   Not on file  Social History Narrative   ** Merged History Encounter **       Social Determinants of Health   Financial Resource Strain: Not on file  Food Insecurity: Not on file  Transportation Needs: Not on file  Physical Activity: Not on file  Stress: Not on file  Social Connections: Not on file  Intimate Partner Violence: Not on file      Review of Systems   Constitutional:  Negative for chills, fatigue and unexpected weight change.  HENT:  Positive for postnasal drip. Negative for congestion, rhinorrhea, sneezing and sore throat.   Eyes:  Negative for redness.  Respiratory:  Negative for cough, chest tightness and shortness of breath.   Cardiovascular:  Negative for chest pain and palpitations.  Gastrointestinal:  Negative for abdominal pain, constipation, diarrhea, nausea and vomiting.  Genitourinary:  Negative for dysuria and frequency.  Musculoskeletal:  Negative for arthralgias, back pain, joint swelling and neck pain.  Skin:  Negative for rash.  Neurological: Negative.  Negative for tremors and numbness.  Hematological:  Negative for adenopathy. Does not  bruise/bleed easily.  Psychiatric/Behavioral:  Negative for behavioral problems (Depression), sleep disturbance and suicidal ideas. The patient is not nervous/anxious.     Vital Signs: BP 137/62   Pulse 72   Temp (!) 97.5 F (36.4 C)   Resp 16   Ht '5\' 3"'  (1.6 m)   Wt 262 lb 3.2 oz (118.9 kg)   SpO2 98%   BMI 46.45 kg/m    Physical Exam Constitutional:      Appearance: Normal appearance.  HENT:     Head: Normocephalic and atraumatic.     Nose: Nose normal.     Mouth/Throat:     Mouth: Mucous membranes are moist.     Pharynx: No posterior oropharyngeal erythema.  Eyes:     Extraocular Movements: Extraocular movements intact.     Pupils: Pupils are equal, round, and reactive to light.  Cardiovascular:     Pulses: Normal pulses.     Heart sounds: Normal heart sounds.  Pulmonary:     Effort: Pulmonary effort is normal.     Breath sounds: Normal breath sounds.  Neurological:     General: No focal deficit present.     Mental Status: She is alert.  Psychiatric:        Mood and Affect: Mood normal.        Behavior: Behavior normal.        Assessment/Plan: 1. NASH (nonalcoholic steatohepatitis) Life style changes, diet adjustment with low CHO and high protein  intake   2. Type 2 diabetes mellitus with hyperglycemia, without long-term current use of insulin (HCC) Samples of Ozempic are given, 025 mg injected here and then increase to 0.5 mg q week, office will call to check   3. BMI 40.0-44.9, adult ([place]) Encouraged for better diet, applauded for effort and wt loss   4. Vitamin D deficiency  OTC intake of calcium and Vit D encouraged   General Counseling: Ahmira verbalizes understanding of the findings of todays visit and agrees with plan of treatment. I have discussed any further diagnostic evaluation that may be needed or ordered today. We also reviewed her medications today. she has been encouraged to call the office with any questions or concerns that should arise related to todays visit.      Total time spent 35 Minutes Time spent includes review of chart, medications, test results, and follow up plan with the patient.   [place] Controlled Substance Database was reviewed by me.   Dr Lavera Guise Internal medicine

## 2021-11-24 LAB — POCT GLYCOSYLATED HEMOGLOBIN (HGB A1C): Hemoglobin A1C: 6.6 % — AB (ref 4.0–5.6)

## 2021-11-24 NOTE — Addendum Note (Signed)
Addended by: Fannie Knee on: 11/24/2021 09:48 AM   Modules accepted: Orders

## 2021-12-06 ENCOUNTER — Ambulatory Visit
Admission: RE | Admit: 2021-12-06 | Discharge: 2021-12-06 | Disposition: A | Discharge status: Home / Self Care | Payer: Medicaid Other | Source: Ambulatory Visit | Attending: Nurse Practitioner | Admitting: Nurse Practitioner

## 2021-12-06 DIAGNOSIS — E2839 Other primary ovarian failure: Secondary | ICD-10-CM

## 2021-12-06 DIAGNOSIS — Z1231 Encounter for screening mammogram for malignant neoplasm of breast: Secondary | ICD-10-CM | POA: Diagnosis present

## 2021-12-07 ENCOUNTER — Other Ambulatory Visit: Payer: Medicaid Other

## 2021-12-12 ENCOUNTER — Other Ambulatory Visit: Payer: Self-pay | Admitting: Nurse Practitioner

## 2021-12-12 DIAGNOSIS — N6489 Other specified disorders of breast: Secondary | ICD-10-CM

## 2021-12-12 DIAGNOSIS — R928 Other abnormal and inconclusive findings on diagnostic imaging of breast: Secondary | ICD-10-CM

## 2021-12-14 ENCOUNTER — Telehealth: Payer: Self-pay

## 2021-12-15 ENCOUNTER — Telehealth: Payer: Self-pay

## 2021-12-15 NOTE — Telephone Encounter (Signed)
Done we are waiting for additional test added

## 2021-12-15 NOTE — Telephone Encounter (Signed)
Spoke with pt son advised that we still waiting for additional test added

## 2021-12-26 ENCOUNTER — Telehealth: Payer: Self-pay | Admitting: Nurse Practitioner

## 2021-12-26 ENCOUNTER — Other Ambulatory Visit: Payer: Self-pay | Admitting: Nurse Practitioner

## 2021-12-26 MED ORDER — SEMAGLUTIDE(0.25 OR 0.5MG/DOS) 2 MG/3ML ~~LOC~~ SOPN: 0.5000 mg | PEN_INJECTOR | SUBCUTANEOUS | 2 refills | Status: DC

## 2021-12-27 NOTE — Telephone Encounter (Signed)
Error

## 2021-12-28 ENCOUNTER — Telehealth: Payer: Self-pay

## 2021-12-30 ENCOUNTER — Ambulatory Visit
Admission: RE | Admit: 2021-12-30 | Discharge: 2021-12-30 | Disposition: A | Discharge status: Home / Self Care | Payer: Medicaid Other | Source: Ambulatory Visit | Attending: Nurse Practitioner | Admitting: Nurse Practitioner

## 2021-12-30 DIAGNOSIS — N6489 Other specified disorders of breast: Secondary | ICD-10-CM | POA: Diagnosis present

## 2021-12-30 DIAGNOSIS — R928 Other abnormal and inconclusive findings on diagnostic imaging of breast: Secondary | ICD-10-CM | POA: Insufficient documentation

## 2021-12-30 NOTE — Telephone Encounter (Signed)
Pt son was here at office today gave him samples and also advised him that pres was sent and check with phar if not covered discuss with dfk at next visit

## 2022-01-02 NOTE — Telephone Encounter (Signed)
PA was denied on 01-02-22 for Ozempic.

## 2022-01-04 ENCOUNTER — Telehealth: Payer: Self-pay

## 2022-01-09 ENCOUNTER — Ambulatory Visit: Payer: Medicaid Other | Admitting: Internal Medicine

## 2022-01-09 ENCOUNTER — Encounter: Payer: Self-pay | Admitting: Internal Medicine

## 2022-01-09 ENCOUNTER — Ambulatory Visit: Payer: Medicaid Other | Admitting: Nurse Practitioner

## 2022-01-09 VITALS — BP 145/79 | HR 67 | Temp 98.2°F | Resp 16 | Ht 63.0 in | Wt 253.6 lb

## 2022-01-09 DIAGNOSIS — Z23 Encounter for immunization: Secondary | ICD-10-CM | POA: Diagnosis not present

## 2022-01-09 DIAGNOSIS — E039 Hypothyroidism, unspecified: Secondary | ICD-10-CM | POA: Diagnosis not present

## 2022-01-09 DIAGNOSIS — I10 Essential (primary) hypertension: Secondary | ICD-10-CM

## 2022-01-09 DIAGNOSIS — E782 Mixed hyperlipidemia: Secondary | ICD-10-CM | POA: Diagnosis not present

## 2022-01-09 DIAGNOSIS — E1165 Type 2 diabetes mellitus with hyperglycemia: Secondary | ICD-10-CM | POA: Diagnosis not present

## 2022-01-09 MED ORDER — AZITHROMYCIN 250 MG PO TABS: ORAL_TABLET | ORAL | 0 refills | Status: DC

## 2022-01-09 NOTE — Progress Notes (Unsigned)
[facility] [place], [place] [address]  Internal MEDICINE  Office Visit Note  Patient Name: Brianna Gray  [id number]  [id number]  Date of Service: 01/10/2022  Chief Complaint  Patient presents with  . Follow-up  . Gastroesophageal Reflux  . Hypertension  . Quality Metric Gaps    Colonoscopy, TDAP, Pneumonia Vaccines    HPI Pt is here for routine follow up Pt was switched to [place] on previous visit due to intolerance to metformin ( was given in [place]), insurance company denied coverage for Ozempic in the patient has lost 10 pounds diabetic numbers are also improving She is also complaining of ongoing cough and congestion with sputum production denies any fever chills does have asthma and has been using Advair more frequently Small superficial abscess, clogged, sebaceous cyst leaking small amount of pus at the right breast mammogram has been normal Good blood pressure control    Current Medication: Outpatient Encounter Medications as of 01/09/2022  Medication Sig  . Accu-Chek Softclix Lancets lancets in the morning and at bedtime. Use to check blood sugars twice a day.  E11.65  . acetic acid-hydrocortisone (VOSOL-HC) OTIC solution Place 3 drops into both ears 2 (two) times daily. As needed for itching  . ADVAIR DISKUS 250-50 MCG/ACT AEPB INHALE 1 PUFF INTO THE LUNGS 2 (TWO) TIMES DAILY AS NEEDED. FOR RESPIRATORY ISSUES.  Marland Kitchen amLODipine (NORVASC) 5 MG tablet Take 5 mg by mouth daily.  Marland Kitchen atorvastatin (LIPITOR) 10 MG tablet TAKE 1 TABLET BY MOUTH EVERYDAY AT BEDTIME  . augmented betamethasone dipropionate (DIPROLENE) 0.05 % ointment Apply topically 2 (two) times daily.  Marland Kitchen azithromycin (ZITHROMAX) 250 MG tablet Take one tab a day for 10 days for uri  . Blood Glucose Monitoring Suppl (ACCU-CHEK GUIDE ME) w/Device KIT 1 each by Does not apply route. Use to check blood sugars twice a day.    E11.65  . Continuous Blood Gluc Receiver (FREESTYLE LIBRE 14 DAY  READER) DEVI 1 each by Does not apply route every 14 (fourteen) days. Use every 14 days to monitor blood sugars   E11.65  . Continuous Blood Gluc Sensor (FREESTYLE LIBRE 14 DAY SENSOR) MISC 1 each by Does not apply route every 14 (fourteen) days. Use every 14 days to monitor blood sugars   E11.65  . Dulaglutide (TRULICITY) 1.5 RF/1.6BW SOPN Inject 1.5 mg into the skin once a week.  . ergocalciferol (VITAMIN D2) 1.25 MG (50000 UT) capsule Take 1 capsule (50,000 Units total) by mouth once a week.  . esomeprazole (NEXIUM) 40 MG capsule TAKE 1 CAPSULE BY MOUTH EVERY DAY FOR STOMACH  . fluticasone (FLONASE) 50 MCG/ACT nasal spray Place 1-2 sprays into both nostrils daily as needed. For nasal congestion.  Marland Kitchen gentamicin cream (GARAMYCIN) 0.1 % Apply 1 application topically 2 (two) times daily.  Marland Kitchen glucose blood (ACCU-CHEK GUIDE) test strip in the morning and at bedtime. Use as instructed to check blood sugars twice a day  E11.65  . hydrALAZINE (APRESOLINE) 25 MG tablet TAKE 1 TABLET BY MOUTH THREE TIMES DAILY  . ipratropium-albuterol (DUONEB) 0.5-2.5 (3) MG/3ML SOLN One vial tid prn for asthma, faillure to MDI  . levothyroxine (SYNTHROID) 88 MCG tablet TAKE 1 TABLET BY MOUTH EVERY MORNING ON EMPTY STOMACH**OK TO CHANGE TO LANNETT PER MD**  . loratadine (CLARITIN) 10 MG tablet TAKE 1 TABLET BY MOUTH EVERY DAY  . losartan (COZAAR) 100 MG tablet Take 1 tablet (100 mg total) by mouth daily.  . meclizine (ANTIVERT) 25 MG tablet  Take 1 tablet (25 mg total) by mouth 2 (two) times daily as needed for dizziness.  . meloxicam (MOBIC) 7.5 MG tablet TAKE 1 TABLET BY MOUTH TWICE A DAY FOR LOWER BACK AND JOINT PAINS  . montelukast (SINGULAIR) 10 MG tablet Take 1 tablet (10 mg total) by mouth at bedtime. For sinus congestion and allergies  . neomycin-polymyxin-hydrocortisone (CORTISPORIN) OTIC solution 3-4 drops in both ears bid prn  . Potassium 99 MG TABS Take 99 mg by mouth daily at 12 noon.  . torsemide (DEMADEX) 20 MG  tablet Take 20 mg by mouth daily.  . vitamin B-12 (CYANOCOBALAMIN) 1000 MCG tablet Take 1,000 mcg by mouth daily at 12 noon.  . [DISCONTINUED] Semaglutide,0.25 or 0.5MG/DOS, 2 MG/3ML SOPN Inject 0.5 mg into the skin once a week.   No facility-administered encounter medications on file as of 01/09/2022.    Surgical History: Past Surgical History:  Procedure Laterality Date  . CHOLECYSTECTOMY    . HERNIA REPAIR    . HERNIA REPAIR    . HYSTEROSCOPY WITH D & C N/A 03/19/2015   Procedure: DILATATION AND CURETTAGE /HYSTEROSCOPY and polypectomy;  Surgeon: Honor Loh Ward, MD;  Location: [facility] ORS;  Service: Gynecology;  Laterality: N/A;  . LAPAROSCOPIC HYSTERECTOMY N/A 06/08/2016   Procedure: HYSTERECTOMY TOTAL LAPAROSCOPIC;  Surgeon: Gae Dry, MD;  Location: [facility];  Service: Gynecology;  Laterality: N/A;  . LAPAROSCOPIC SALPINGO OOPHERECTOMY Bilateral 06/08/2016   Procedure: LAPAROSCOPIC SALPINGO OOPHORECTOMY;  Surgeon: Gae Dry, MD;  Location: [facility];  Service: Gynecology;  Laterality: Bilateral;  . LAPAROTOMY N/A 06/08/2016   Procedure: LAPAROTOMY;  Surgeon: Gae Dry, MD;  Location: [facility] OR[facility];  Service: Gynecology;  Laterality: N/A;    Medical History: Past Medical History:  Diagnosis Date  . Asthma   . Cardiomyopathy ([place])   . Carpal tunnel syndrome   . Carpal tunnel syndrome   . Dizziness   . Edema   . Elevated lipids   . GERD (gastroesophageal reflux disease)   . Hypertension   . Lower extremity edema   . Seasonal allergies   . Sleep apnea   . Wheezing     Family History: Family History  Problem Relation Age of Onset  . Diabetes Brother   . Hypertension Brother   . Breast cancer Neg Hx     Social History   Socioeconomic History  . Marital status: Married    Spouse name: Not on file  . Number of children: Not on file  . Years of education: Not on file  . Highest education level: Not on file  Occupational History  . Not on file  Tobacco Use   . Smoking status: Never  . Smokeless tobacco: Current    Types: Chew  Substance and Sexual Activity  . Alcohol use: No  . Drug use: No  . Sexual activity: Never  Other Topics Concern  . Not on file  Social History Narrative   ** Merged History Encounter **       Social Determinants of Health   Financial Resource Strain: Not on file  Food Insecurity: Not on file  Transportation Needs: Not on file  Physical Activity: Not on file  Stress: Not on file  Social Connections: Not on file  Intimate Partner Violence: Not on file      Review of Systems  Constitutional:  Negative for fatigue and fever.  HENT:  Negative for congestion, mouth sores and postnasal drip.   Respiratory:  Positive for cough and  wheezing.   Cardiovascular:  Negative for chest pain.  Genitourinary:  Negative for flank pain.  Psychiatric/Behavioral: Negative.      Vital Signs: BP (!) 145/79   Pulse 67   Temp 98.2 F (36.8 C)   Resp 16   Ht _0  (1.6 m)   Wt 253 lb 9.6 oz (115 kg)   SpO2 96%   BMI 44.92 kg/m    Physical Exam Constitutional:      General: She is not in acute distress.    Appearance: She is well-developed. She is not diaphoretic.  HENT:     Head: Normocephalic and atraumatic.     Mouth/Throat:     Pharynx: No oropharyngeal exudate.  Eyes:     Pupils: Pupils are equal, round, and reactive to light.  Neck:     Thyroid: No thyromegaly.     Vascular: No JVD.     Trachea: No tracheal deviation.  Cardiovascular:     Rate and Rhythm: Normal rate and regular rhythm.     Pulses:          Dorsalis pedis pulses are 3+ on the right side and 3+ on the left side.       Posterior tibial pulses are 3+ on the right side and 3+ on the left side.     Heart sounds: Normal heart sounds. No murmur heard.    No friction rub. No gallop.  Pulmonary:     Effort: Pulmonary effort is normal. No respiratory distress.     Breath sounds: No wheezing or rales.  Chest:     Chest wall: No  tenderness.  Abdominal:     General: Bowel sounds are normal.     Palpations: Abdomen is soft.  Musculoskeletal:        General: Normal range of motion.     Cervical back: Normal range of motion and neck supple.     Right foot: Normal range of motion.     Left foot: Normal range of motion.  Feet:     Right foot:     Protective Sensation: 2 sites tested.  2 sites sensed.     Skin integrity: Skin integrity normal.     Toenail Condition: Right toenails are normal.     Left foot:     Protective Sensation: 2 sites tested.  2 sites sensed.     Skin integrity: Skin integrity normal.     Toenail Condition: Left toenails are normal.  Lymphadenopathy:     Cervical: No cervical adenopathy.  Skin:    General: Skin is warm and dry.  Neurological:     Mental Status: She is alert and oriented to person, place, and time.     Cranial Nerves: No cranial nerve deficit.  Psychiatric:        Behavior: Behavior normal.        Thought Content: Thought content normal.        Judgment: Judgment normal.       Assessment/Plan: 1. Type 2 diabetes mellitus with hyperglycemia, without long-term current use of insulin (HCC) Pt is doing very well on Ozempic however insurance denied  coverage, will send a prescription for Trulicity see if it is covered - Urine Microalbumin w/creat. ratio  2. Acquired hypothyroidism Continue Synthroid as before  3. Mixed hyperlipidemia Patient is on statin doing well  4. Flu vaccine need - Flu Vaccine MDCK QUAD PF  5. Benign hypertension Recently seen by cardiology, dose of losartan HCTZ was changed to however  patient has not started the medicine yet he was instructed that patient should stop her Demadex and potassium supplement if she is going to be taking increased dose of losartan/HCTZ and will also discuss it with the cardiologist  General Counseling: Trinitey verbalizes understanding of the findings of todays visit and agrees with plan of treatment. I have  discussed any further diagnostic evaluation that may be needed or ordered today. We also reviewed her medications today. she has been encouraged to call the office with any questions or concerns that should arise related to todays visit.    Orders Placed This Encounter  Procedures  . Flu Vaccine MDCK QUAD PF  . Urine Microalbumin w/creat. ratio    Meds ordered this encounter  Medications  . azithromycin (ZITHROMAX) 250 MG tablet    Sig: Take one tab a day for 10 days for uri    Dispense:  10 tablet    Refill:  0  . Dulaglutide (TRULICITY) 1.5 WH/6.7RF SOPN    Sig: Inject 1.5 mg into the skin once a week.    Dispense:  6 mL    Refill:  1    Total time spent:35 Minutes Time spent includes review of chart, medications, test results, and follow up plan with the patient.    Controlled Substance Database was reviewed by me.   Dr Lavera Guise Internal medicine

## 2022-01-10 LAB — MICROALBUMIN / CREATININE URINE RATIO
Creatinine, Urine: 255.6 mg/dL
Microalb/Creat Ratio: 21 mg/g creat (ref 0–29)
Microalbumin, Urine: 53.1 ug/mL

## 2022-01-10 MED ORDER — TRULICITY 1.5 MG/0.5ML ~~LOC~~ SOAJ: 1.5000 mg | SUBCUTANEOUS | 1 refills | Status: DC

## 2022-01-11 NOTE — Telephone Encounter (Signed)
Patient was approved for the Trulicity. Son was notified.

## 2022-02-14 ENCOUNTER — Telehealth: Payer: Self-pay

## 2022-02-27 IMAGING — US US BREAST*L* LIMITED INC AXILLA
1 series · 6 of 6 positions shown · non-contrast
Comparison: Previous exam(s).

CLINICAL DATA: Left axillary mass felt on recent physical
examination. The patient reports that she had a vaccine 2 months ago
and not sure which arm it was given in. Also had a small area of
brown skin discoloration in the left axilla which says has been
present for 1 month.

EXAM:
DIGITAL DIAGNOSTIC BILATERAL MAMMOGRAM WITH TOMOSYNTHESIS AND CAD;
ULTRASOUND LEFT BREAST LIMITED
TECHNIQUE: Bilateral digital diagnostic mammography and breast tomosynthesis
was performed. The images were evaluated with computer-aided
detection.; Targeted ultrasound examination of the left breast was
performed

[Series 1: us breast*left* limited inc axilla · 0.08mm/px · 6 of 6 slices shown]
[im 1/6]
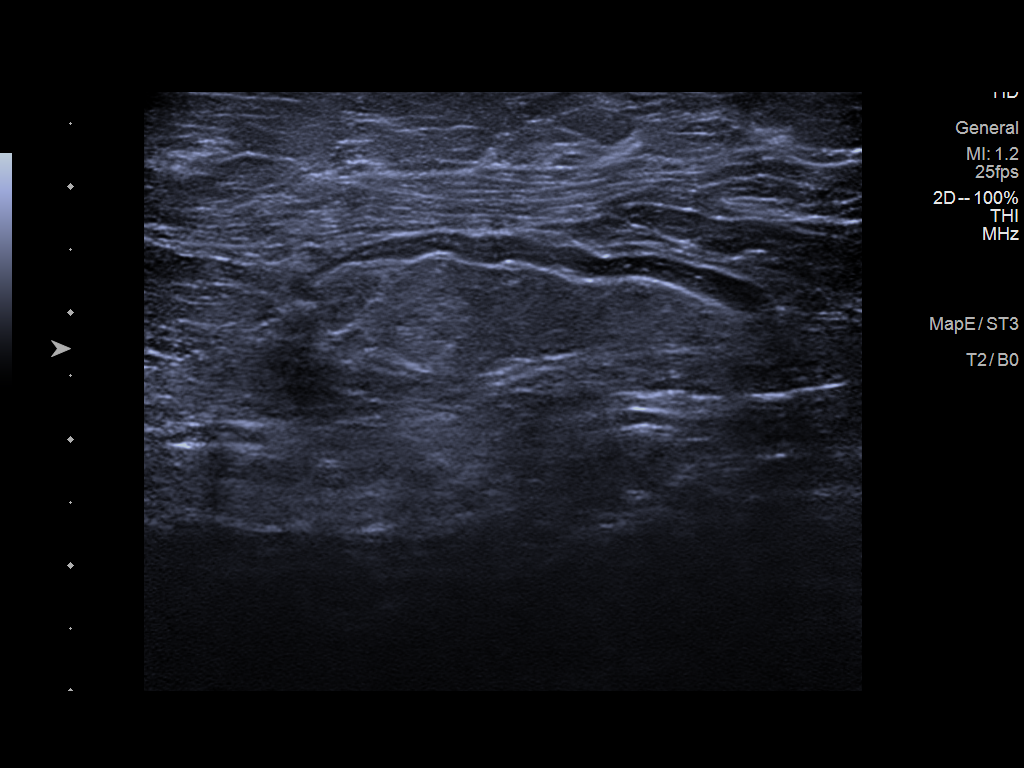
[im 2/6]
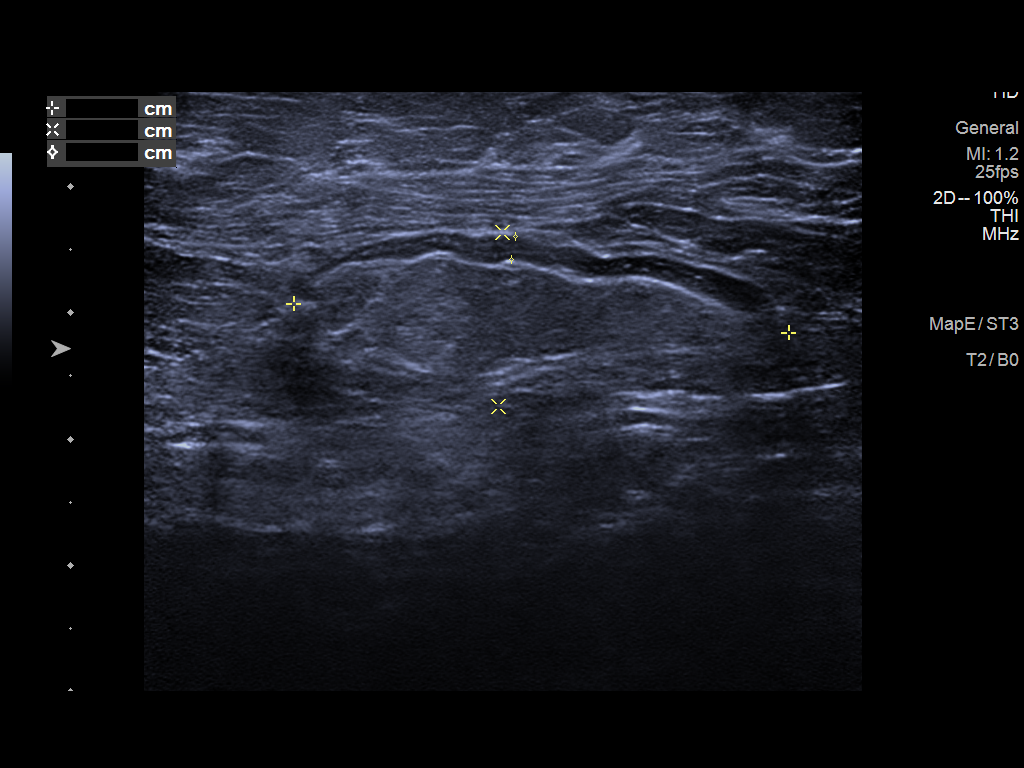
[im 3/6]
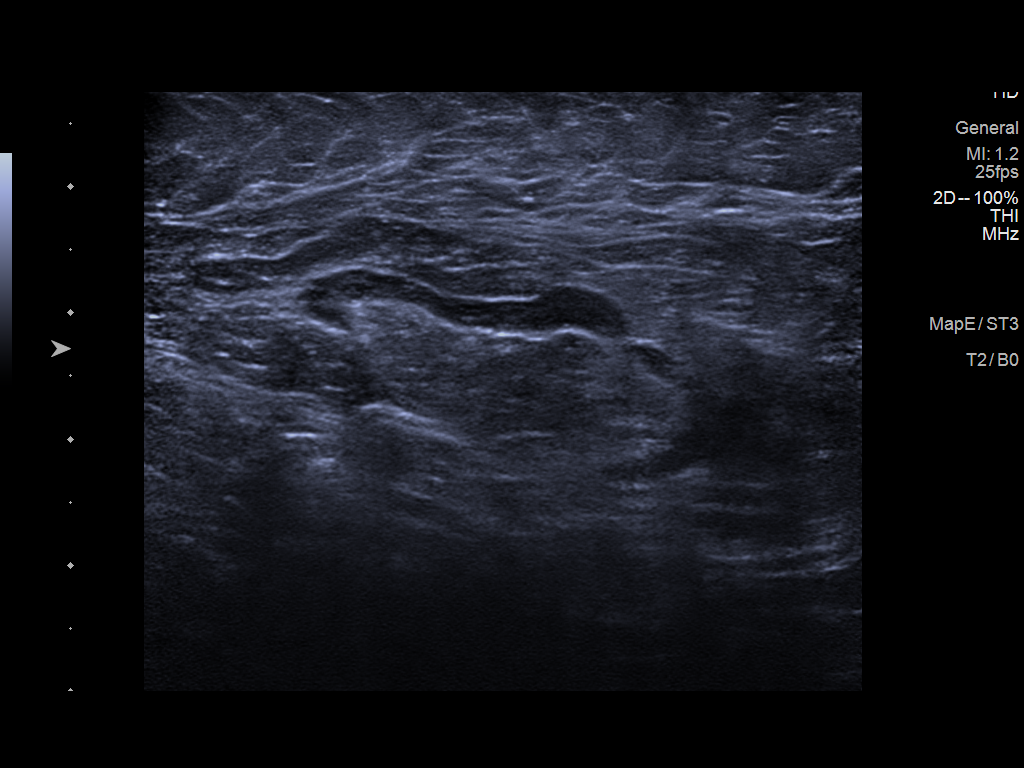
[im 4/6]
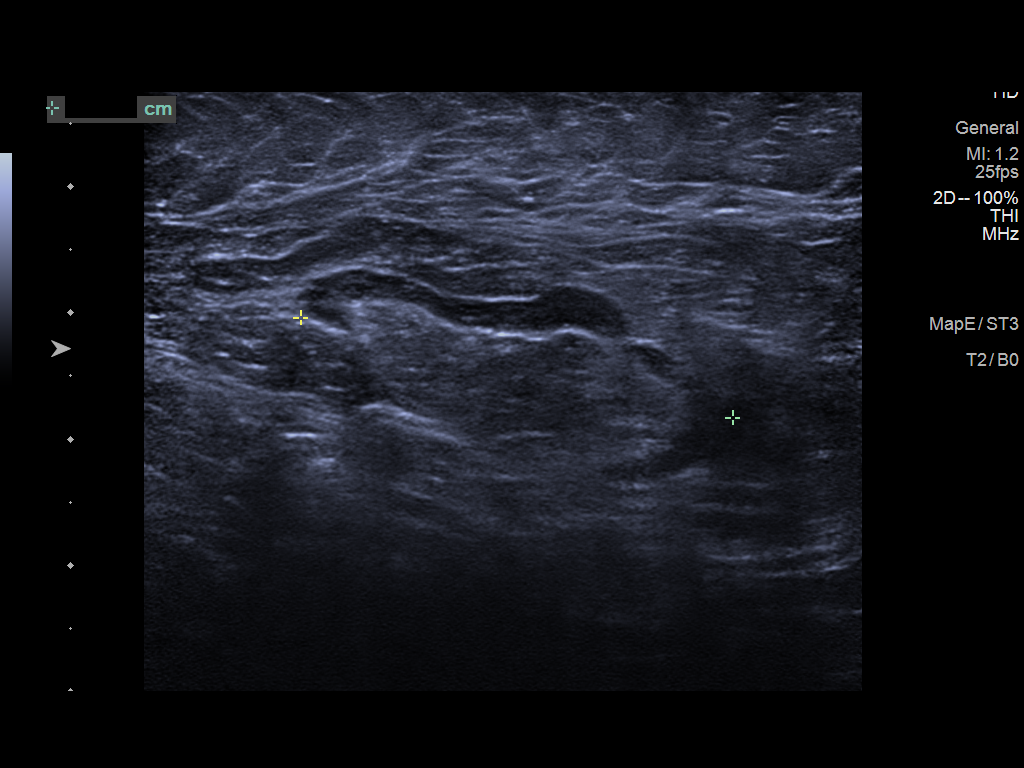
[im 5/6]
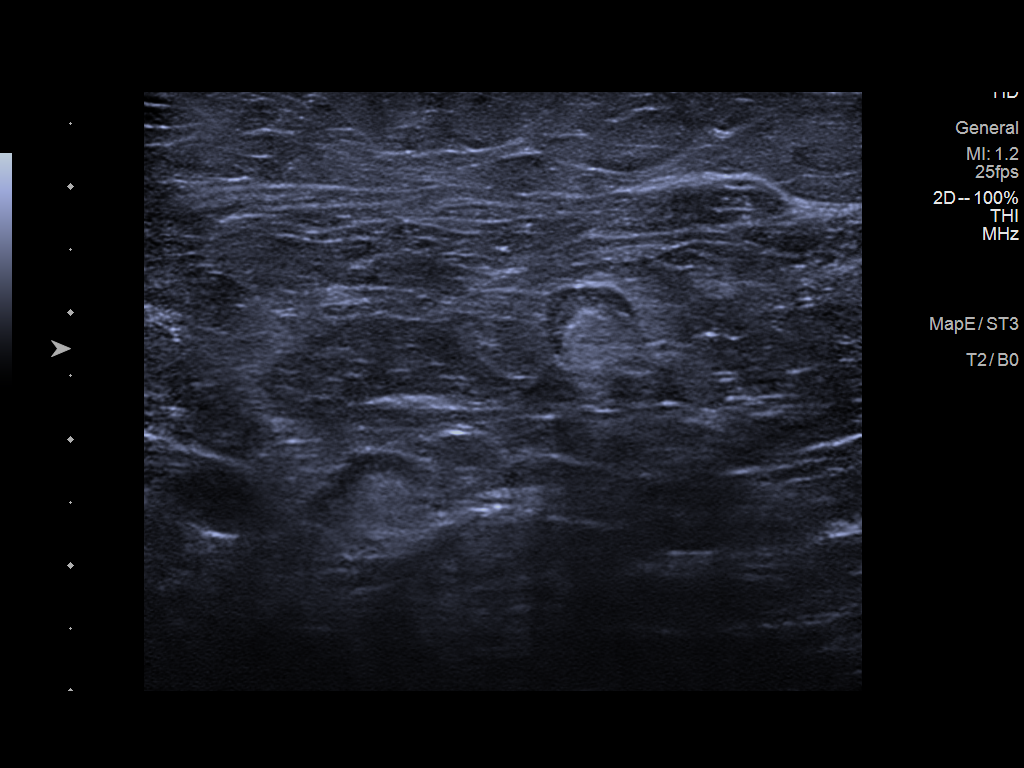
[im 6/6]
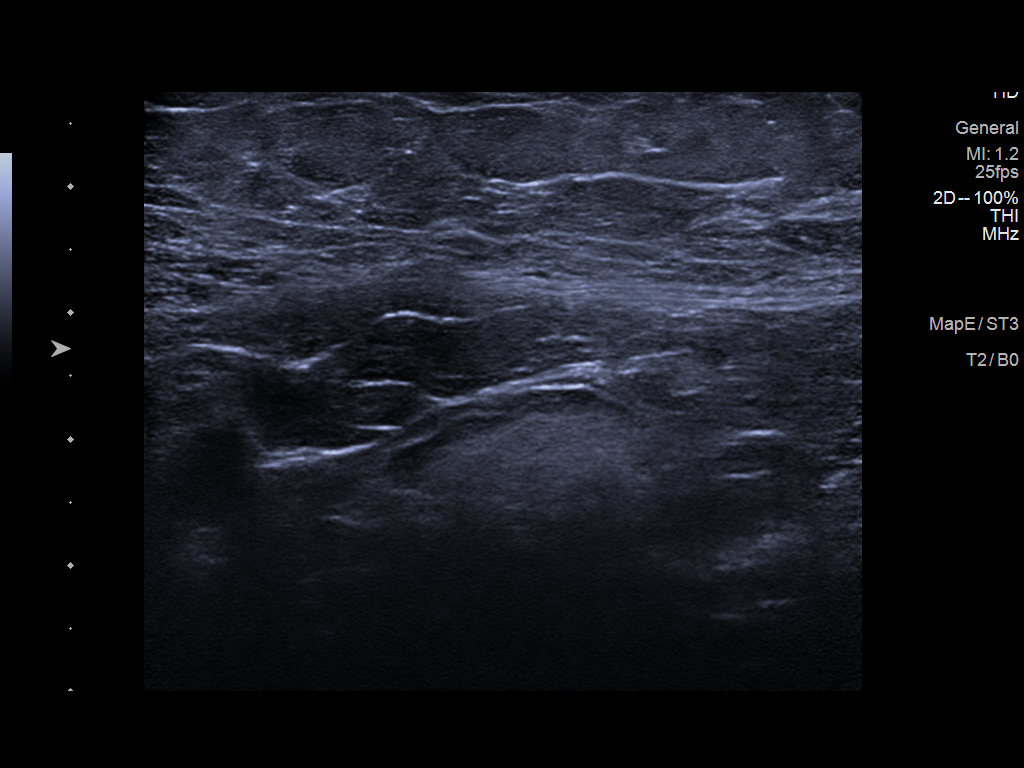

[6 of 6 positions shown; findings below may reference images not displayed]

ACR Breast Density Category b: There are scattered areas of
fibroglandular density.
FINDINGS: Stable mammographic appearance of the breasts with no interval
findings suspicious for malignancy in either breast. Normal
appearing left axillary lymph nodes at the location the left
axillary mass reported by the patient, marked with a metallic
marker.

On physical exam, the patient has an oval, faint, mass-like area in
the left axilla at the location of patient palpable concern. This is
not tender to palpation today.

Targeted ultrasound is performed, showing a normal left axillary
lymph node corresponding to the palpable mass. No abnormal lymph
nodes or other abnormalities seen.
IMPRESSION: No evidence of malignancy. The area of palpable concern in the left
axilla is a normal lymph node.

RECOMMENDATION:
Bilateral screening mammogram in 1 year.

I have discussed the findings and recommendations with the patient.
If applicable, a reminder letter will be sent to the patient
regarding the next appointment.

BI-RADS CATEGORY  1: Negative.

## 2022-02-28 ENCOUNTER — Other Ambulatory Visit: Payer: Self-pay | Admitting: Internal Medicine

## 2022-03-07 ENCOUNTER — Ambulatory Visit: Payer: Medicaid Other | Admitting: Internal Medicine

## 2022-03-07 ENCOUNTER — Encounter: Payer: Self-pay | Admitting: Internal Medicine

## 2022-03-07 VITALS — BP 142/74 | HR 93 | Temp 98.0°F | Resp 16 | Ht 63.0 in | Wt 253.0 lb

## 2022-03-07 DIAGNOSIS — E782 Mixed hyperlipidemia: Secondary | ICD-10-CM | POA: Diagnosis not present

## 2022-03-07 DIAGNOSIS — E1165 Type 2 diabetes mellitus with hyperglycemia: Secondary | ICD-10-CM

## 2022-03-07 DIAGNOSIS — J4540 Moderate persistent asthma, uncomplicated: Secondary | ICD-10-CM

## 2022-03-07 DIAGNOSIS — I10 Essential (primary) hypertension: Secondary | ICD-10-CM

## 2022-03-07 MED ORDER — UMECLIDINIUM-VILANTEROL 62.5-25 MCG/ACT IN AEPB: 1.0000 | INHALATION_SPRAY | Freq: Every day | RESPIRATORY_TRACT | 4 refills | Status: AC

## 2022-03-07 MED ORDER — TRULICITY 1.5 MG/0.5ML ~~LOC~~ SOAJ: 1.5000 mg | SUBCUTANEOUS | 1 refills | Status: DC

## 2022-03-07 NOTE — Progress Notes (Signed)
[facility] [place], [place] [address]  Internal MEDICINE  Office Visit Note  Patient Name: Brianna Gray  [id number]  [id number]  Date of Service: 03/07/2022  Chief Complaint  Patient presents with   Follow-up   Gastroesophageal Reflux   Hypertension    HPI Pt is here for routine follow up She is doing very well, diabetic control is not improving, has been taking samples of Trulicity, will like a new RX, has difficulty losing wt, she is not very active due to knee pain Advair is not available so will like to get something else  Has seen by cardiology OSA on CPAP Will like refills     Current Medication: Outpatient Encounter Medications as of 03/07/2022  Medication Sig   Accu-Chek Softclix Lancets lancets in the morning and at bedtime. Use to check blood sugars twice a day.  E11.65   acetic acid-hydrocortisone (VOSOL-HC) OTIC solution Place 3 drops into both ears 2 (two) times daily. As needed for itching   amLODipine (NORVASC) 5 MG tablet Take 5 mg by mouth daily.   atorvastatin (LIPITOR) 10 MG tablet TAKE 1 TABLET BY MOUTH EVERYDAY AT BEDTIME   augmented betamethasone dipropionate (DIPROLENE) 0.05 % ointment Apply topically 2 (two) times daily.   azithromycin (ZITHROMAX) 250 MG tablet Take one tab a day for 10 days for uri   Blood Glucose Monitoring Suppl (ACCU-CHEK GUIDE ME) w/Device KIT 1 each by Does not apply route. Use to check blood sugars twice a day.    E11.65   Continuous Blood Gluc Receiver (FREESTYLE LIBRE 14 DAY READER) DEVI 1 each by Does not apply route every 14 (fourteen) days. Use every 14 days to monitor blood sugars   E11.65   Continuous Blood Gluc Sensor (FREESTYLE LIBRE 14 DAY SENSOR) MISC 1 each by Does not apply route every 14 (fourteen) days. Use every 14 days to monitor blood sugars   E11.65   ergocalciferol (VITAMIN D2) 1.25 MG (50000 UT) capsule Take 1 capsule (50,000 Units total) by mouth once a week.   esomeprazole (NEXIUM)  40 MG capsule TAKE 1 CAPSULE BY MOUTH EVERY DAY FOR STOMACH   fluticasone (FLONASE) 50 MCG/ACT nasal spray Place 1-2 sprays into both nostrils daily as needed. For nasal congestion.   gentamicin cream (GARAMYCIN) 0.1 % Apply 1 application topically 2 (two) times daily.   glucose blood (ACCU-CHEK GUIDE) test strip in the morning and at bedtime. Use as instructed to check blood sugars twice a day  E11.65   hydrALAZINE (APRESOLINE) 25 MG tablet TAKE 1 TABLET BY MOUTH THREE TIMES DAILY   ipratropium-albuterol (DUONEB) 0.5-2.5 (3) MG/3ML SOLN One vial tid prn for asthma, faillure to MDI   levothyroxine (SYNTHROID) 88 MCG tablet TAKE 1 TABLET BY MOUTH EVERY MORNING ON EMPTY STOMACH**OK TO CHANGE TO LANNETT PER MD**   loratadine (CLARITIN) 10 MG tablet TAKE 1 TABLET BY MOUTH EVERY DAY   losartan (COZAAR) 100 MG tablet Take 1 tablet (100 mg total) by mouth daily.   meclizine (ANTIVERT) 25 MG tablet Take 1 tablet (25 mg total) by mouth 2 (two) times daily as needed for dizziness.   meloxicam (MOBIC) 7.5 MG tablet TAKE 1 TABLET BY MOUTH TWICE A DAY FOR LOWER BACK AND JOINT PAINS   montelukast (SINGULAIR) 10 MG tablet Take 1 tablet (10 mg total) by mouth at bedtime. For sinus congestion and allergies   neomycin-polymyxin-hydrocortisone (CORTISPORIN) OTIC solution 3-4 drops in both ears bid prn   Potassium 99 MG TABS  Take 99 mg by mouth daily at 12 noon.   torsemide (DEMADEX) 20 MG tablet Take 20 mg by mouth daily.   umeclidinium-vilanterol (ANORO ELLIPTA) 62.5-25 MCG/ACT AEPB Inhale 1 puff into the lungs daily at 6 (six) AM.   vitamin B-12 (CYANOCOBALAMIN) 1000 MCG tablet Take 1,000 mcg by mouth daily at 12 noon.   [DISCONTINUED] ADVAIR DISKUS 250-50 MCG/ACT AEPB INHALE 1 PUFF INTO THE LUNGS 2 (TWO) TIMES DAILY AS NEEDED. FOR RESPIRATORY ISSUES.   [DISCONTINUED] Dulaglutide (TRULICITY) 1.5 ZD/6.6YQ SOPN Inject 1.5 mg into the skin once a week.   Dulaglutide (TRULICITY) 1.5 IH/4.7QQ SOPN Inject 1.5 mg into  the skin once a week.   No facility-administered encounter medications on file as of 03/07/2022.    Surgical History: Past Surgical History:  Procedure Laterality Date   CHOLECYSTECTOMY     HERNIA REPAIR     HERNIA REPAIR     HYSTEROSCOPY WITH D & C N/A 03/19/2015   Procedure: DILATATION AND CURETTAGE /HYSTEROSCOPY and polypectomy;  Surgeon: Honor Loh Ward, MD;  Location: [facility];  Service: Gynecology;  Laterality: N/A;   LAPAROSCOPIC HYSTERECTOMY N/A 06/08/2016   Procedure: HYSTERECTOMY TOTAL LAPAROSCOPIC;  Surgeon: Gae Dry, MD;  Location: [facility];  Service: Gynecology;  Laterality: N/A;   LAPAROSCOPIC SALPINGO OOPHERECTOMY Bilateral 06/08/2016   Procedure: LAPAROSCOPIC SALPINGO OOPHORECTOMY;  Surgeon: Gae Dry, MD;  Location: [facility];  Service: Gynecology;  Laterality: Bilateral;   LAPAROTOMY N/A 06/08/2016   Procedure: LAPAROTOMY;  Surgeon: Gae Dry, MD;  Location: [facility];  Service: Gynecology;  Laterality: N/A;    Medical History: Past Medical History:  Diagnosis Date   Asthma    Cardiomyopathy ([place])    Carpal tunnel syndrome    Carpal tunnel syndrome    Dizziness    Edema    Elevated lipids    GERD (gastroesophageal reflux disease)    Hypertension    Lower extremity edema    Seasonal allergies    Sleep apnea    Wheezing     Family History: Family History  Problem Relation Age of Onset   Diabetes Brother    Hypertension Brother    Breast cancer Neg Hx     Social History   Socioeconomic History   Marital status: Married    Spouse name: Not on file   Number of children: Not on file   Years of education: Not on file   Highest education level: Not on file  Occupational History   Not on file  Tobacco Use   Smoking status: Never   Smokeless tobacco: Current    Types: Chew  Substance and Sexual Activity   Alcohol use: No   Drug use: No   Sexual activity: Never  Other Topics Concern   Not on file  Social History Narrative   **  Merged History Encounter **       Social Determinants of Health   Financial Resource Strain: Not on file  Food Insecurity: Not on file  Transportation Needs: Not on file  Physical Activity: Not on file  Stress: Not on file  Social Connections: Not on file  Intimate Partner Violence: Not on file      Review of Systems  Constitutional:  Negative for fatigue and fever.  HENT:  Negative for congestion, mouth sores and postnasal drip.   Respiratory:  Negative for cough.   Cardiovascular:  Negative for chest pain.  Genitourinary:  Negative for flank pain.  Musculoskeletal:  Positive for arthralgias.  Knee pain  Psychiatric/Behavioral: Negative.      Vital Signs: BP (!) 142/74   Pulse 93   Temp 98 F (36.7 C)   Resp 16   Ht '5\' 3"'$  (1.6 m)   Wt 253 lb (114.8 kg)   SpO2 97%   BMI 44.82 kg/m    Physical Exam Constitutional:      Appearance: Normal appearance.  HENT:     Head: Normocephalic and atraumatic.     Nose: Nose normal.     Mouth/Throat:     Mouth: Mucous membranes are moist.     Pharynx: No posterior oropharyngeal erythema.  Eyes:     Extraocular Movements: Extraocular movements intact.     Pupils: Pupils are equal, round, and reactive to light.  Cardiovascular:     Pulses: Normal pulses.     Heart sounds: Normal heart sounds.  Pulmonary:     Effort: Pulmonary effort is normal.     Breath sounds: Normal breath sounds.  Neurological:     General: No focal deficit present.     Mental Status: She is alert.  Psychiatric:        Mood and Affect: Mood normal.        Behavior: Behavior normal.        Assessment/Plan:  1. Poorly controlled diabetes mellitus ([place]) Will increase Trulicity as prescribed today  - Dulaglutide (TRULICITY) 1.5 AS/3.4HD SOPN; Inject 1.5 mg into the skin once a week.  Dispense: 6 mL; Refill: 1  2. Moderate persistent asthma without complication Change to Anoro since Advair is not available  - umeclidinium-vilanterol  (ANORO ELLIPTA) 62.5-25 MCG/ACT AEPB; Inhale 1 puff into the lungs daily at 6 (six) AM.  Dispense: 1 each; Refill: 4  3. Mixed hyperlipidemia Continue Lipitor   4. Benign hypertension Controlled    General Counseling: Brianna Gray verbalizes understanding of the findings of todays visit and agrees with plan of treatment. I have discussed any further diagnostic evaluation that may be needed or ordered today. We also reviewed her medications today. she has been encouraged to call the office with any questions or concerns that should arise related to todays visit.    No orders of the defined types were placed in this encounter.   Meds ordered this encounter  Medications   Dulaglutide (TRULICITY) 1.5 QQ/2.2LN SOPN    Sig: Inject 1.5 mg into the skin once a week.    Dispense:  6 mL    Refill:  1   umeclidinium-vilanterol (ANORO ELLIPTA) 62.5-25 MCG/ACT AEPB    Sig: Inhale 1 puff into the lungs daily at 6 (six) AM.    Dispense:  1 each    Refill:  4    Total time spent:35 Minutes Time spent includes review of chart, medications, test results, and follow up plan with the patient.   [place] Controlled Substance Database was reviewed by me.   Dr Lavera Guise Internal medicine

## 2022-03-10 NOTE — Telephone Encounter (Signed)
Pt was seen by DFK and she change inhalers

## 2022-04-06 ENCOUNTER — Other Ambulatory Visit: Payer: Self-pay | Admitting: Internal Medicine

## 2022-04-17 ENCOUNTER — Telehealth: Payer: Self-pay

## 2022-04-17 ENCOUNTER — Other Ambulatory Visit: Payer: Self-pay

## 2022-04-17 MED ORDER — TRULICITY 0.75 MG/0.5ML ~~LOC~~ SOAJ: 0.7500 mg | SUBCUTANEOUS | 3 refills | Status: DC

## 2022-04-17 NOTE — Telephone Encounter (Signed)
Pt son called that trulicity 1.5 mg is back order as per dr Humphrey Rolls change trulicity [id number] to [facility] and pt son aware

## 2022-04-25 ENCOUNTER — Ambulatory Visit: Payer: Medicaid Other | Admitting: Internal Medicine

## 2022-04-25 ENCOUNTER — Encounter: Payer: Self-pay | Admitting: Internal Medicine

## 2022-04-25 ENCOUNTER — Other Ambulatory Visit: Payer: Self-pay

## 2022-04-25 VITALS — BP 149/74 | HR 66 | Temp 98.0°F | Resp 16 | Ht 63.0 in | Wt 255.0 lb

## 2022-04-25 DIAGNOSIS — J4540 Moderate persistent asthma, uncomplicated: Secondary | ICD-10-CM

## 2022-04-25 DIAGNOSIS — E782 Mixed hyperlipidemia: Secondary | ICD-10-CM | POA: Diagnosis not present

## 2022-04-25 DIAGNOSIS — E1140 Type 2 diabetes mellitus with diabetic neuropathy, unspecified: Secondary | ICD-10-CM

## 2022-04-25 DIAGNOSIS — Z6841 Body Mass Index (BMI) 40.0 and over, adult: Secondary | ICD-10-CM

## 2022-04-25 DIAGNOSIS — E1165 Type 2 diabetes mellitus with hyperglycemia: Secondary | ICD-10-CM | POA: Diagnosis not present

## 2022-04-25 MED ORDER — ACCU-CHEK SOFTCLIX LANCETS MISC: 1 refills | Status: DC

## 2022-04-25 MED ORDER — ACCU-CHEK GUIDE VI STRP: ORAL_STRIP | 1 refills | Status: DC

## 2022-04-25 MED ORDER — ACCU-CHEK GUIDE ME W/DEVICE KIT: PACK | 0 refills | Status: AC

## 2022-04-25 MED ORDER — ATORVASTATIN CALCIUM 10 MG PO TABS: 10.0000 mg | ORAL_TABLET | Freq: Every day | ORAL | 3 refills | Status: DC

## 2022-04-25 NOTE — Progress Notes (Signed)
[facility] [place], [place] [address]  Internal MEDICINE  Office Visit Note  Patient Name: Brianna Gray  [id number]  [id number]  Date of Service: 05/17/2022  Chief Complaint  Patient presents with   Follow-up   Gastroesophageal Reflux   Hypertension   Quality Metric Gaps    TDAP, Pneumonia Vaccines    HPI  Pt is here for routine follow up She has ben out of her Trulicity due to shortage, she is using samples  BP is slightly elevated but home readings are normal  Watching her diet  OSA on CPAP, not seen in sleep clinic    Current Medication: Outpatient Encounter Medications as of 04/25/2022  Medication Sig   acetic acid-hydrocortisone (VOSOL-HC) OTIC solution Place 3 drops into both ears 2 (two) times daily. As needed for itching   amLODipine (NORVASC) 5 MG tablet Take 5 mg by mouth daily.   atorvastatin (LIPITOR) 10 MG tablet TAKE 1 TABLET BY MOUTH EVERYDAY AT BEDTIME   atorvastatin (LIPITOR) 10 MG tablet Take 1 tablet (10 mg total) by mouth daily.   augmented betamethasone dipropionate (DIPROLENE) 0.05 % ointment Apply topically 2 (two) times daily.   azithromycin (ZITHROMAX) 250 MG tablet Take one tab a day for 10 days for uri   Continuous Blood Gluc Receiver (FREESTYLE LIBRE 14 DAY READER) DEVI 1 each by Does not apply route every 14 (fourteen) days. Use every 14 days to monitor blood sugars   E11.65   Continuous Blood Gluc Sensor (FREESTYLE LIBRE 14 DAY SENSOR) MISC 1 each by Does not apply route every 14 (fourteen) days. Use every 14 days to monitor blood sugars   E11.65   Dulaglutide (TRULICITY) 6.57 QI/6.9GE SOPN Inject 0.75 mg into the skin once a week.   ergocalciferol (VITAMIN D2) 1.25 MG (50000 UT) capsule Take 1 capsule (50,000 Units total) by mouth once a week.   esomeprazole (NEXIUM) 40 MG capsule TAKE 1 CAPSULE BY MOUTH EVERY DAY FOR STOMACH   fluticasone (FLONASE) 50 MCG/ACT nasal spray Place 1-2 sprays into both nostrils daily as  needed. For nasal congestion.   gentamicin cream (GARAMYCIN) 0.1 % Apply 1 application topically 2 (two) times daily.   hydrALAZINE (APRESOLINE) 25 MG tablet TAKE 1 TABLET BY MOUTH THREE TIMES DAILY   ipratropium-albuterol (DUONEB) 0.5-2.5 (3) MG/3ML SOLN One vial tid prn for asthma, faillure to MDI   levothyroxine (SYNTHROID) 88 MCG tablet TAKE 1 TABLET BY MOUTH EVERY MORNING ON EMPTY STOMACH**OK TO CHANGE TO LANNETT PER MD**   loratadine (CLARITIN) 10 MG tablet TAKE 1 TABLET BY MOUTH EVERY DAY   losartan (COZAAR) 100 MG tablet TAKE 1 TABLET BY MOUTH EVERY DAY   meclizine (ANTIVERT) 25 MG tablet Take 1 tablet (25 mg total) by mouth 2 (two) times daily as needed for dizziness.   meloxicam (MOBIC) 7.5 MG tablet TAKE 1 TABLET BY MOUTH TWICE A DAY FOR LOWER BACK AND JOINT PAINS   montelukast (SINGULAIR) 10 MG tablet Take 1 tablet (10 mg total) by mouth at bedtime. For sinus congestion and allergies   neomycin-polymyxin-hydrocortisone (CORTISPORIN) OTIC solution 3-4 drops in both ears bid prn   Potassium 99 MG TABS Take 99 mg by mouth daily at 12 noon.   torsemide (DEMADEX) 20 MG tablet Take 20 mg by mouth daily.   umeclidinium-vilanterol (ANORO ELLIPTA) 62.5-25 MCG/ACT AEPB Inhale 1 puff into the lungs daily at 6 (six) AM.   vitamin B-12 (CYANOCOBALAMIN) 1000 MCG tablet Take 1,000 mcg by mouth daily at  12 noon.   [DISCONTINUED] Accu-Chek Softclix Lancets lancets in the morning and at bedtime. Use to check blood sugars twice a day.  E11.65   [DISCONTINUED] Blood Glucose Monitoring Suppl (ACCU-CHEK GUIDE ME) w/Device KIT 1 each by Does not apply route. Use to check blood sugars twice a day.    E11.65   [DISCONTINUED] glucose blood (ACCU-CHEK GUIDE) test strip in the morning and at bedtime. Use as instructed to check blood sugars twice a day  E11.65   No facility-administered encounter medications on file as of 04/25/2022.    Surgical History: Past Surgical History:  Procedure Laterality Date    CHOLECYSTECTOMY     HERNIA REPAIR     HERNIA REPAIR     HYSTEROSCOPY WITH D & C N/A 03/19/2015   Procedure: DILATATION AND CURETTAGE /HYSTEROSCOPY and polypectomy;  Surgeon: Honor Loh Ward, MD;  Location: [facility];  Service: Gynecology;  Laterality: N/A;   LAPAROSCOPIC HYSTERECTOMY N/A 06/08/2016   Procedure: HYSTERECTOMY TOTAL LAPAROSCOPIC;  Surgeon: Gae Dry, MD;  Location: [facility];  Service: Gynecology;  Laterality: N/A;   LAPAROSCOPIC SALPINGO OOPHERECTOMY Bilateral 06/08/2016   Procedure: LAPAROSCOPIC SALPINGO OOPHORECTOMY;  Surgeon: Gae Dry, MD;  Location: [facility];  Service: Gynecology;  Laterality: Bilateral;   LAPAROTOMY N/A 06/08/2016   Procedure: LAPAROTOMY;  Surgeon: Gae Dry, MD;  Location: [facility];  Service: Gynecology;  Laterality: N/A;    Medical History: Past Medical History:  Diagnosis Date   Asthma    Cardiomyopathy ([place])    Carpal tunnel syndrome    Carpal tunnel syndrome    Dizziness    Edema    Elevated lipids    GERD (gastroesophageal reflux disease)    Hypertension    Lower extremity edema    Seasonal allergies    Sleep apnea    Wheezing     Family History: Family History  Problem Relation Age of Onset   Diabetes Brother    Hypertension Brother    Breast cancer Neg Hx     Social History   Socioeconomic History   Marital status: Married    Spouse name: Not on file   Number of children: Not on file   Years of education: Not on file   Highest education level: Not on file  Occupational History   Not on file  Tobacco Use   Smoking status: Never   Smokeless tobacco: Current    Types: Chew  Substance and Sexual Activity   Alcohol use: No   Drug use: No   Sexual activity: Never  Other Topics Concern   Not on file  Social History Narrative   ** Merged History Encounter **       Social Determinants of Health   Financial Resource Strain: Not on file  Food Insecurity: Not on file  Transportation Needs: Not on file   Physical Activity: Not on file  Stress: Not on file  Social Connections: Not on file  Intimate Partner Violence: Not on file      Review of Systems  Constitutional:  Negative for chills, fatigue and unexpected weight change.  HENT:  Positive for postnasal drip. Negative for congestion, rhinorrhea, sneezing and sore throat.   Eyes:  Negative for redness.  Respiratory:  Negative for cough, chest tightness and shortness of breath.   Cardiovascular:  Negative for chest pain and palpitations.  Gastrointestinal:  Negative for abdominal pain, constipation, diarrhea, nausea and vomiting.  Genitourinary:  Negative for dysuria and frequency.  Musculoskeletal:  Negative  for arthralgias, back pain, joint swelling and neck pain.  Skin:  Negative for rash.  Neurological: Negative.  Negative for tremors and numbness.  Hematological:  Negative for adenopathy. Does not bruise/bleed easily.  Psychiatric/Behavioral:  Negative for behavioral problems (Depression), sleep disturbance and suicidal ideas. The patient is not nervous/anxious.     Vital Signs: BP (!) 149/74   Pulse 66   Temp 98 F (36.7 C)   Resp 16   Ht 5\' 3"  (1.6 m)   Wt 255 lb (115.7 kg)   SpO2 97%   BMI 45.17 kg/m    Physical Exam Constitutional:      Appearance: Normal appearance.  HENT:     Head: Normocephalic and atraumatic.     Nose: Nose normal.     Mouth/Throat:     Mouth: Mucous membranes are moist.     Pharynx: No posterior oropharyngeal erythema.  Eyes:     Extraocular Movements: Extraocular movements intact.     Pupils: Pupils are equal, round, and reactive to light.  Cardiovascular:     Pulses: Normal pulses.     Heart sounds: Normal heart sounds.  Pulmonary:     Effort: Pulmonary effort is normal.     Breath sounds: Normal breath sounds.  Neurological:     General: No focal deficit present.     Mental Status: She is alert.  Psychiatric:        Mood and Affect: Mood normal.        Behavior: Behavior  normal.        Assessment/Plan: 1. Poorly controlled diabetes mellitus ([place]) Will continue trulicity as before  - Ambulatory referral to Ophthalmology  2. Mixed hyperlipidemia Continue Lipitor as before   3. Moderate persistent asthma without complication Controlled   4. Morbid obesity with BMI of 45.0-49.9, adult (HCC) Obesity Counseling: Risk Assessment: An assessment of behavioral risk factors was made today and includes lack of exercise sedentary lifestyle, lack of portion control and poor dietary habits.  Risk Modification Advice: She was counseled on portion control guidelines. Restricting daily caloric intake to 1500. The detrimental long term effects of obesity on her health and ongoing poor compliance was also discussed with the patient.     General Counseling: Detrice verbalizes understanding of the findings of todays visit and agrees with plan of treatment. I have discussed any further diagnostic evaluation that may be needed or ordered today. We also reviewed her medications today. she has been encouraged to call the office with any questions or concerns that should arise related to todays visit.    Orders Placed This Encounter  Procedures   Ambulatory referral to Ophthalmology    Meds ordered this encounter  Medications   atorvastatin (LIPITOR) 10 MG tablet    Sig: Take 1 tablet (10 mg total) by mouth daily.    Dispense:  90 tablet    Refill:  3    Total time spent:35 Minutes Time spent includes review of chart, medications, test results, and follow up plan with the patient.   [place] Controlled Substance Database was reviewed by me.   Dr Lavera Guise Internal medicine

## 2022-04-27 ENCOUNTER — Telehealth: Payer: Self-pay | Admitting: Internal Medicine

## 2022-04-27 NOTE — Telephone Encounter (Signed)
Awaiting 04/25/22 office notes for Ophthalmology referral-Toni

## 2022-05-19 ENCOUNTER — Telehealth: Payer: Self-pay | Admitting: Internal Medicine

## 2022-05-19 NOTE — Telephone Encounter (Signed)
Ophthalmology referral sent via [facility] to [facility]-Toni 

## 2022-05-22 ENCOUNTER — Telehealth: Payer: Self-pay | Admitting: Internal Medicine

## 2022-05-22 NOTE — Telephone Encounter (Signed)
[place] with patient's insurance. Ophthalmology referral faxed to [facility]; [phone]

## 2022-06-10 ENCOUNTER — Other Ambulatory Visit: Payer: Self-pay | Admitting: Internal Medicine

## 2022-06-10 DIAGNOSIS — J45909 Unspecified asthma, uncomplicated: Secondary | ICD-10-CM

## 2022-06-24 ENCOUNTER — Other Ambulatory Visit: Payer: Self-pay | Admitting: Internal Medicine

## 2022-06-24 DIAGNOSIS — J45909 Unspecified asthma, uncomplicated: Secondary | ICD-10-CM

## 2022-07-17 ENCOUNTER — Other Ambulatory Visit: Payer: Self-pay | Admitting: Internal Medicine

## 2022-08-02 ENCOUNTER — Other Ambulatory Visit: Payer: Self-pay | Admitting: Internal Medicine

## 2022-08-28 ENCOUNTER — Encounter: Payer: Self-pay | Admitting: Nurse Practitioner

## 2022-08-28 ENCOUNTER — Telehealth: Payer: Self-pay | Admitting: Nurse Practitioner

## 2022-08-28 ENCOUNTER — Ambulatory Visit: Payer: Medicaid Other | Admitting: Nurse Practitioner

## 2022-08-28 VITALS — BP 139/70 | HR 80 | Temp 97.8°F | Resp 16 | Ht 63.0 in | Wt 254.0 lb

## 2022-08-28 DIAGNOSIS — G5603 Carpal tunnel syndrome, bilateral upper limbs: Secondary | ICD-10-CM | POA: Diagnosis not present

## 2022-08-28 DIAGNOSIS — M79642 Pain in left hand: Secondary | ICD-10-CM

## 2022-08-28 DIAGNOSIS — E782 Mixed hyperlipidemia: Secondary | ICD-10-CM

## 2022-08-28 DIAGNOSIS — E1165 Type 2 diabetes mellitus with hyperglycemia: Secondary | ICD-10-CM

## 2022-08-28 DIAGNOSIS — E039 Hypothyroidism, unspecified: Secondary | ICD-10-CM

## 2022-08-28 DIAGNOSIS — E538 Deficiency of other specified B group vitamins: Secondary | ICD-10-CM

## 2022-08-28 DIAGNOSIS — E559 Vitamin D deficiency, unspecified: Secondary | ICD-10-CM

## 2022-08-28 DIAGNOSIS — M79641 Pain in right hand: Secondary | ICD-10-CM | POA: Diagnosis not present

## 2022-08-28 LAB — POCT GLYCOSYLATED HEMOGLOBIN (HGB A1C): Hemoglobin A1C: 5.9 % — AB (ref 4.0–5.6)

## 2022-08-28 NOTE — Telephone Encounter (Signed)
Orthopedic Surgery referral sent via [facility] to [facility]. -Toni 

## 2022-08-28 NOTE — Telephone Encounter (Signed)
Awaiting 08/28/22 office notes for Orthopedic referral-Toni

## 2022-08-28 NOTE — Progress Notes (Signed)
[facility] [address] [place], [place] [address]  Internal MEDICINE  Office Visit Note  Patient Name: Brianna Gray  [id number]  [id number]  Date of Service: 08/28/2022  Chief Complaint  Patient presents with   Follow-up    Carpel tunnel   Diabetes    HPI Brianna Gray presents for a follow-up visit for diabetes, hypothyroidism, hand pain and CTS bilaterally.  Diabetes -- improving down to A1c 5.9 from 6.6.  Due for labs for upcoming AWV in august Hypothyroidism -- lab has been stable on current dose of levothyroxine.  Bilateral hand pain and symptoms of carpal tunnel -- has been going on for a while. Reports numbness in hands and hand and wrist pain bilaterally, decreased grip strength and some itching of her palms.  High cholesterol -- taking atorvastatin 10 mg daily.  Low vitamin D -- was very low last year, will repeat lab and reassess.     Current Medication: Outpatient Encounter Medications as of 08/28/2022  Medication Sig   Accu-Chek Softclix Lancets lancets Use to check blood sugars twice a day.  E11.65   acetic acid-hydrocortisone (VOSOL-HC) OTIC solution Place 3 drops into both ears 2 (two) times daily. As needed for itching   ADVAIR DISKUS 250-50 MCG/ACT AEPB INHALE 1 PUFF INTO THE LUNGS 2 (TWO) TIMES DAILY AS NEEDED. FOR RESPIRATORY ISSUES.   amLODipine (NORVASC) 5 MG tablet Take 5 mg by mouth daily.   atorvastatin (LIPITOR) 10 MG tablet TAKE 1 TABLET BY MOUTH EVERYDAY AT BEDTIME   atorvastatin (LIPITOR) 10 MG tablet Take 1 tablet (10 mg total) by mouth daily.   augmented betamethasone dipropionate (DIPROLENE) 0.05 % ointment Apply topically 2 (two) times daily.   azithromycin (ZITHROMAX) 250 MG tablet Take one tab a day for 10 days for uri   Blood Glucose Monitoring Suppl (ACCU-CHEK GUIDE ME) w/Device KIT Use to check blood sugars twice a day.    E11.65   Continuous Blood Gluc Receiver (FREESTYLE LIBRE 14 DAY READER) DEVI 1 each by Does not apply route every  14 (fourteen) days. Use every 14 days to monitor blood sugars   E11.65   Continuous Blood Gluc Sensor (FREESTYLE LIBRE 14 DAY SENSOR) MISC 1 each by Does not apply route every 14 (fourteen) days. Use every 14 days to monitor blood sugars   E11.65   ergocalciferol (VITAMIN D2) 1.25 MG (50000 UT) capsule Take 1 capsule (50,000 Units total) by mouth once a week.   esomeprazole (NEXIUM) 40 MG capsule TAKE 1 CAPSULE BY MOUTH EVERY DAY FOR STOMACH   fluticasone (FLONASE) 50 MCG/ACT nasal spray Place 1-2 sprays into both nostrils daily as needed. For nasal congestion.   gentamicin cream (GARAMYCIN) 0.1 % Apply 1 application topically 2 (two) times daily.   glucose blood (ACCU-CHEK GUIDE) test strip Use as instructed to check blood sugars twice a day  E11.65   hydrALAZINE (APRESOLINE) 25 MG tablet TAKE 1 TABLET BY MOUTH THREE TIMES DAILY   ipratropium-albuterol (DUONEB) 0.5-2.5 (3) MG/3ML SOLN One vial tid prn for asthma, faillure to MDI   levothyroxine (SYNTHROID) 88 MCG tablet TAKE 1 TABLET BY MOUTH EVERY MORNING ON EMPTY STOMACH**OK TO CHANGE TO LANNETT PER MD**   loratadine (CLARITIN) 10 MG tablet TAKE 1 TABLET BY MOUTH EVERY DAY   losartan (COZAAR) 100 MG tablet TAKE 1 TABLET BY MOUTH EVERY DAY   meclizine (ANTIVERT) 25 MG tablet Take 1 tablet (25 mg total) by mouth 2 (two) times daily as needed for dizziness.   meloxicam (  MOBIC) 7.5 MG tablet TAKE 1 TABLET BY MOUTH TWICE A DAY FOR LOWER BACK AND JOINT PAINS   montelukast (SINGULAIR) 10 MG tablet Take 1 tablet (10 mg total) by mouth at bedtime. For sinus congestion and allergies   neomycin-polymyxin-hydrocortisone (CORTISPORIN) OTIC solution 3-4 drops in both ears bid prn   Potassium 99 MG TABS Take 99 mg by mouth daily at 12 noon.   torsemide (DEMADEX) 20 MG tablet Take 20 mg by mouth daily.   TRULICITY 0.75 MG/0.5ML SOPN ADMINISTER 0.75 MG UNDER THE SKIN 1 TIME A WEEK   umeclidinium-vilanterol (ANORO ELLIPTA) 62.5-25 MCG/ACT AEPB Inhale 1 puff into  the lungs daily at 6 (six) AM.   vitamin B-12 (CYANOCOBALAMIN) 1000 MCG tablet Take 1,000 mcg by mouth daily at 12 noon.   No facility-administered encounter medications on file as of 08/28/2022.    Surgical History: Past Surgical History:  Procedure Laterality Date   CHOLECYSTECTOMY     HERNIA REPAIR     HERNIA REPAIR     HYSTEROSCOPY WITH D & C N/A 03/19/2015   Procedure: DILATATION AND CURETTAGE /HYSTEROSCOPY and polypectomy;  Surgeon: Elenora Fender Ward, MD;  Location: [facility] ORS;  Service: Gynecology;  Laterality: N/A;   LAPAROSCOPIC HYSTERECTOMY N/A 06/08/2016   Procedure: HYSTERECTOMY TOTAL LAPAROSCOPIC;  Surgeon: Nadara Mustard, MD;  Location: [facility] OR[facility];  Service: Gynecology;  Laterality: N/A;   LAPAROSCOPIC SALPINGO OOPHERECTOMY Bilateral 06/08/2016   Procedure: LAPAROSCOPIC SALPINGO OOPHORECTOMY;  Surgeon: Nadara Mustard, MD;  Location: [facility];  Service: Gynecology;  Laterality: Bilateral;   LAPAROTOMY N/A 06/08/2016   Procedure: LAPAROTOMY;  Surgeon: Nadara Mustard, MD;  Location: [facility];  Service: Gynecology;  Laterality: N/A;    Medical History: Past Medical History:  Diagnosis Date   Asthma    Cardiomyopathy (HCC)    Carpal tunnel syndrome    Carpal tunnel syndrome    Dizziness    Edema    Elevated lipids    GERD (gastroesophageal reflux disease)    Hypertension    Lower extremity edema    Seasonal allergies    Sleep apnea    Wheezing     Family History: Family History  Problem Relation Age of Onset   Diabetes Brother    Hypertension Brother    Breast cancer Neg Hx     Social History   Socioeconomic History   Marital status: Married    Spouse name: Not on file   Number of children: Not on file   Years of education: Not on file   Highest education level: Not on file  Occupational History   Not on file  Tobacco Use   Smoking status: Never   Smokeless tobacco: Current    Types: Chew  Substance and Sexual Activity   Alcohol use: No   Drug use: No    Sexual activity: Never  Other Topics Concern   Not on file  Social History Narrative   ** Merged History Encounter **       Social Determinants of Health   Financial Resource Strain: Not on file  Food Insecurity: Not on file  Transportation Needs: Not on file  Physical Activity: Not on file  Stress: Not on file  Social Connections: Not on file  Intimate Partner Violence: Not on file      Review of Systems  Constitutional: Negative.  Negative for fatigue.  HENT: Negative.    Respiratory: Negative.  Negative for cough, chest tightness, shortness of breath and wheezing.   Cardiovascular:  Negative.  Negative for chest pain and palpitations.  Gastrointestinal: Negative.  Negative for vomiting.  Genitourinary: Negative.   Musculoskeletal:  Positive for arthralgias and myalgias.  Neurological:  Positive for weakness (in bilateral hands) and numbness (bilateral hands).  Psychiatric/Behavioral: Negative.      Vital Signs: BP 139/70   Pulse 80   Temp 97.8 F (36.6 C)   Resp 16   Ht 5\' 3"  (1.6 m)   Wt 254 lb (115.2 kg)   SpO2 97%   BMI 44.99 kg/m    Physical Exam Vitals reviewed.  Constitutional:      General: She is not in acute distress.    Appearance: Normal appearance. She is obese. She is not ill-appearing.  HENT:     Head: Normocephalic and atraumatic.  Eyes:     Pupils: Pupils are equal, round, and reactive to light.  Cardiovascular:     Rate and Rhythm: Normal rate and regular rhythm.  Pulmonary:     Effort: Pulmonary effort is normal. No respiratory distress.  Musculoskeletal:     Right wrist: Swelling and tenderness present. Decreased range of motion.     Left wrist: Swelling and tenderness present. Decreased range of motion.     Right hand: Swelling present. Decreased range of motion. Decreased strength. Decreased sensation (numbness comes and goes).     Left hand: Swelling present. Decreased range of motion. Decreased strength. Decreased sensation  (numbness comes and goes).  Neurological:     Mental Status: She is alert and oriented to person, place, and time.  Psychiatric:        Mood and Affect: Mood normal.        Behavior: Behavior normal.        Assessment/Plan: 1. Type 2 diabetes mellitus with hyperglycemia, without long-term current use of insulin (HCC) A1c improving, stable at this time, routine labs ordered for upcoming annual wellness visit.  - POCT HgB A1C - CBC with Differential/Platelet - CMP14+EGFR - Lipid Profile - B12 and Folate Panel - TSH + free T4  2. Carpal tunnel syndrome, bilateral Referred to orthopedic  - Ambulatory referral to Orthopedic Surgery  3. Bilateral hand pain Referred to orthopedic - Ambulatory referral to Orthopedic Surgery  4. Acquired hypothyroidism Routine lab ordered  - CMP14+EGFR - Lipid Profile - Vitamin D (25 hydroxy) - TSH + free T4  5. Mixed hyperlipidemia Routine labs ordered  - CMP14+EGFR - Lipid Profile - TSH + free T4  6. B12 deficiency Routine lab ordered  - CBC with Differential/Platelet - B12 and Folate Panel  7. Vitamin D deficiency Routine lab ordered  - Vitamin D (25 hydroxy)   General Counseling: Brianna Gray verbalizes understanding of the findings of todays visit and agrees with plan of treatment. I have discussed any further diagnostic evaluation that may be needed or ordered today. We also reviewed her medications today. she has been encouraged to call the office with any questions or concerns that should arise related to todays visit.    Orders Placed This Encounter  Procedures   CBC with Differential/Platelet   CMP14+EGFR   Lipid Profile   B12 and Folate Panel   Vitamin D (25 hydroxy)   TSH + free T4   Ambulatory referral to Orthopedic Surgery   POCT HgB A1C    No orders of the defined types were placed in this encounter.   Return for previously scheduled, AWV, Brianna Gray PCP on 10/18/22.   Total time spent:30 Minutes Time spent  includes review of chart,  medications, test results, and follow up plan with the patient.   [place] Controlled Substance Database was reviewed by me.  This patient was seen by Jonetta Osgood, FNP-C in collaboration with Dr. Clayborn Bigness as a part of collaborative care agreement.   Brianna Bennett R. Valetta Fuller, MSN, FNP-C Internal medicine

## 2022-08-30 ENCOUNTER — Telehealth: Payer: Self-pay | Admitting: Nurse Practitioner

## 2022-08-30 NOTE — Telephone Encounter (Signed)
Orthopedic appointment 08/30/2022 @ [facility]

## 2022-09-26 ENCOUNTER — Other Ambulatory Visit: Payer: Self-pay | Admitting: Nurse Practitioner

## 2022-10-11 LAB — CBC WITH DIFFERENTIAL/PLATELET
Basophils Absolute: 0.1 10*3/uL (ref 0.0–0.2)
Basos: 1 %
EOS (ABSOLUTE): 0.4 10*3/uL (ref 0.0–0.4)
Eos: 4 %
Hematocrit: 35.9 % (ref 34.0–46.6)
Hemoglobin: 11.5 g/dL (ref 11.1–15.9)
Immature Grans (Abs): 0.1 10*3/uL (ref 0.0–0.1)
Immature Granulocytes: 1 %
Lymphocytes Absolute: 2.5 10*3/uL (ref 0.7–3.1)
Lymphs: 27 %
MCH: 25.3 pg — ABNORMAL LOW (ref 26.6–33.0)
MCHC: 32 g/dL (ref 31.5–35.7)
MCV: 79 fL (ref 79–97)
Monocytes Absolute: 0.6 10*3/uL (ref 0.1–0.9)
Monocytes: 6 %
Neutrophils Absolute: 5.8 10*3/uL (ref 1.4–7.0)
Neutrophils: 61 %
Platelets: 313 10*3/uL (ref 150–450)
RBC: 4.54 x10E6/uL (ref 3.77–5.28)
RDW: 14.3 % (ref 11.7–15.4)
WBC: 9.4 10*3/uL (ref 3.4–10.8)

## 2022-10-11 LAB — CMP14+EGFR
ALT: 20 IU/L (ref 0–32)
AST: 20 IU/L (ref 0–40)
Albumin: 4 g/dL (ref 3.8–4.8)
Alkaline Phosphatase: 86 IU/L (ref 44–121)
BUN/Creatinine Ratio: 18 (ref 12–28)
BUN: 16 mg/dL (ref 8–27)
Bilirubin Total: 0.3 mg/dL (ref 0.0–1.2)
CO2: 25 mmol/L (ref 20–29)
Calcium: 10.4 mg/dL — ABNORMAL HIGH (ref 8.7–10.3)
Chloride: 102 mmol/L (ref 96–106)
Creatinine, Ser: 0.89 mg/dL (ref 0.57–1.00)
Globulin, Total: 2.6 g/dL (ref 1.5–4.5)
Glucose: 98 mg/dL (ref 70–99)
Potassium: 4.5 mmol/L (ref 3.5–5.2)
Sodium: 140 mmol/L (ref 134–144)
Total Protein: 6.6 g/dL (ref 6.0–8.5)
eGFR: 68 mL/min/{1.73_m2} (ref >59)

## 2022-10-11 LAB — B12 AND FOLATE PANEL
Folate: 7.3 ng/mL (ref >3.0)
Vitamin B-12: 911 pg/mL (ref 232–1245)

## 2022-10-11 LAB — TSH+FREE T4
Free T4: 1.06 ng/dL (ref 0.82–1.77)
TSH: 2.29 u[IU]/mL (ref 0.450–4.500)

## 2022-10-11 LAB — LIPID PANEL
Chol/HDL Ratio: 3.5 ratio (ref 0.0–4.4)
Cholesterol, Total: 174 mg/dL (ref 100–199)
HDL: 50 mg/dL (ref >39)
LDL Chol Calc (NIH): 95 mg/dL (ref 0–99)
Triglycerides: 168 mg/dL — ABNORMAL HIGH (ref 0–149)
VLDL Cholesterol Cal: 29 mg/dL (ref 5–40)

## 2022-10-11 LAB — VITAMIN D 25 HYDROXY (VIT D DEFICIENCY, FRACTURES): Vit D, 25-Hydroxy: 31.7 ng/mL (ref 30.0–100.0)

## 2022-10-18 ENCOUNTER — Encounter: Payer: Medicaid Other | Admitting: Nurse Practitioner

## 2022-10-19 ENCOUNTER — Ambulatory Visit (INDEPENDENT_AMBULATORY_CARE_PROVIDER_SITE_OTHER): Payer: Medicaid Other | Admitting: Nurse Practitioner

## 2022-10-19 ENCOUNTER — Encounter: Payer: Self-pay | Admitting: Nurse Practitioner

## 2022-10-19 VITALS — BP 130/72 | HR 73 | Temp 98.2°F | Resp 16 | Ht 63.0 in | Wt 259.2 lb

## 2022-10-19 DIAGNOSIS — Z0001 Encounter for general adult medical examination with abnormal findings: Secondary | ICD-10-CM

## 2022-10-19 DIAGNOSIS — R300 Dysuria: Secondary | ICD-10-CM

## 2022-10-19 DIAGNOSIS — E782 Mixed hyperlipidemia: Secondary | ICD-10-CM

## 2022-10-19 DIAGNOSIS — E039 Hypothyroidism, unspecified: Secondary | ICD-10-CM | POA: Diagnosis not present

## 2022-10-19 DIAGNOSIS — E1165 Type 2 diabetes mellitus with hyperglycemia: Secondary | ICD-10-CM | POA: Diagnosis not present

## 2022-10-19 DIAGNOSIS — G5603 Carpal tunnel syndrome, bilateral upper limbs: Secondary | ICD-10-CM | POA: Diagnosis not present

## 2022-10-19 NOTE — Progress Notes (Signed)
[facility] [address] [place], [place] [address]  Internal MEDICINE  Office Visit Note  Patient Name: Brianna Gray  [id number]  [id number]  Date of Service: 10/19/2022  Chief Complaint  Patient presents with   Gastroesophageal Reflux   Hypertension   Annual Exam   Follow-up    HPI Brianna Gray presents for an annual well visit and physical exam.  Well-appearing 72 y.o. female with diabetes, high cholesterol, OSA, GERD, osteoarthritis, carpal tunnel and hypertension.  Routine CRC screening: not done, family member said will discuss at home.  Routine mammogram: done in October 2023 DEXA scan: done in October last year  Labs: done last week and discuss with patient and family today New or worsening pain: chronic back pain, knee pain and carpal tunnel, seeing orthopedic specialist.  A1c improved last month Triglycerides significantly improved.  Vitamin D level improved to normal range. Kidney function is normal now. Liver enzymes have improved to normal range as well. Fasting glucose of 98 on CMP also.       10/12/2021   10:51 AM  MMSE - Mini Mental State Exam  Orientation to time 5  Orientation to Place 5  Registration 3  Attention/ Calculation 5  Recall 3  Language- name 2 objects 2  Language- repeat 1  Language- follow 3 step command 3  Language- read & follow direction 1  Write a sentence 1  Copy design 1  Total score 30    Functional Status Survey: Is the patient deaf or have difficulty hearing?: No Does the patient have difficulty seeing, even when wearing glasses/contacts?: No Does the patient have difficulty concentrating, remembering, or making decisions?: No Does the patient have difficulty walking or climbing stairs?: Yes Does the patient have difficulty dressing or bathing?: Yes Does the patient have difficulty doing errands alone such as visiting a doctor's office or shopping?: No     06/27/2021    1:38 PM 01/09/2022    2:14 PM 04/25/2022    11:06 AM 08/28/2022   11:08 AM 10/19/2022   10:26 AM  Fall Risk  Falls in the past year? 0 0 0 0 0  Was there an injury with Fall?     0  Fall Risk Category Calculator     0  (RETIRED) Patient Fall Risk Level Low fall risk      Patient at Risk for Falls Due to No Fall Risks   No Fall Risks No Fall Risks  Fall risk Follow up Falls evaluation completed   Falls evaluation completed Falls evaluation completed       10/19/2022   10:26 AM  Depression screen PHQ 2/9  Decreased Interest 0  Down, Depressed, Hopeless 0  PHQ - 2 Score 0       Current Medication: Outpatient Encounter Medications as of 10/19/2022  Medication Sig   Accu-Chek Softclix Lancets lancets Use to check blood sugars twice a day.  E11.65   acetic acid-hydrocortisone (VOSOL-HC) OTIC solution Place 3 drops into both ears 2 (two) times daily. As needed for itching   ADVAIR DISKUS 250-50 MCG/ACT AEPB INHALE 1 PUFF INTO THE LUNGS 2 (TWO) TIMES DAILY AS NEEDED. FOR RESPIRATORY ISSUES.   amLODipine (NORVASC) 5 MG tablet Take 5 mg by mouth daily.   atorvastatin (LIPITOR) 10 MG tablet TAKE 1 TABLET BY MOUTH EVERYDAY AT BEDTIME   atorvastatin (LIPITOR) 10 MG tablet Take 1 tablet (10 mg total) by mouth daily.   augmented betamethasone dipropionate (DIPROLENE) 0.05 % ointment Apply topically 2 (  two) times daily.   azithromycin (ZITHROMAX) 250 MG tablet Take one tab a day for 10 days for uri   Blood Glucose Monitoring Suppl (ACCU-CHEK GUIDE ME) w/Device KIT Use to check blood sugars twice a day.    E11.65   Continuous Blood Gluc Receiver (FREESTYLE LIBRE 14 DAY READER) DEVI 1 each by Does not apply route every 14 (fourteen) days. Use every 14 days to monitor blood sugars   E11.65   Continuous Blood Gluc Sensor (FREESTYLE LIBRE 14 DAY SENSOR) MISC 1 each by Does not apply route every 14 (fourteen) days. Use every 14 days to monitor blood sugars   E11.65   ergocalciferol (VITAMIN D2) 1.25 MG (50000 UT) capsule Take 1 capsule (50,000  Units total) by mouth once a week.   esomeprazole (NEXIUM) 40 MG capsule TAKE 1 CAPSULE BY MOUTH EVERY DAY FOR STOMACH   fluticasone (FLONASE) 50 MCG/ACT nasal spray Place 1-2 sprays into both nostrils daily as needed. For nasal congestion.   gentamicin cream (GARAMYCIN) 0.1 % Apply 1 application topically 2 (two) times daily.   glucose blood (ACCU-CHEK GUIDE) test strip Use as instructed to check blood sugars twice a day  E11.65   hydrALAZINE (APRESOLINE) 25 MG tablet TAKE 1 TABLET BY MOUTH THREE TIMES DAILY   ipratropium-albuterol (DUONEB) 0.5-2.5 (3) MG/3ML SOLN One vial tid prn for asthma, faillure to MDI   levothyroxine (SYNTHROID) 88 MCG tablet TAKE 1 TABLET BY MOUTH EVERY MORNING ON EMPTY STOMACH**OK TO CHANGE TO LANNETT PER MD**   loratadine (CLARITIN) 10 MG tablet TAKE 1 TABLET BY MOUTH EVERY DAY   losartan (COZAAR) 100 MG tablet TAKE 1 TABLET BY MOUTH EVERY DAY   meclizine (ANTIVERT) 25 MG tablet Take 1 tablet (25 mg total) by mouth 2 (two) times daily as needed for dizziness.   meloxicam (MOBIC) 7.5 MG tablet TAKE 1 TABLET BY MOUTH TWICE A DAY FOR LOWER BACK AND JOINT PAINS   montelukast (SINGULAIR) 10 MG tablet Take 1 tablet (10 mg total) by mouth at bedtime. For sinus congestion and allergies   neomycin-polymyxin-hydrocortisone (CORTISPORIN) OTIC solution 3-4 drops in both ears bid prn   Potassium 99 MG TABS Take 99 mg by mouth daily at 12 noon.   torsemide (DEMADEX) 20 MG tablet Take 20 mg by mouth daily.   TRULICITY 0.75 MG/0.5ML SOPN ADMINISTER 0.75 MG UNDER THE SKIN 1 TIME A WEEK   umeclidinium-vilanterol (ANORO ELLIPTA) 62.5-25 MCG/ACT AEPB Inhale 1 puff into the lungs daily at 6 (six) AM.   vitamin B-12 (CYANOCOBALAMIN) 1000 MCG tablet Take 1,000 mcg by mouth daily at 12 noon.   No facility-administered encounter medications on file as of 10/19/2022.    Surgical History: Past Surgical History:  Procedure Laterality Date   CHOLECYSTECTOMY     HERNIA REPAIR     HERNIA  REPAIR     HYSTEROSCOPY WITH D & C N/A 03/19/2015   Procedure: DILATATION AND CURETTAGE /HYSTEROSCOPY and polypectomy;  Surgeon: Elenora Fender Ward, MD;  Location: [facility] ORS;  Service: Gynecology;  Laterality: N/A;   LAPAROSCOPIC HYSTERECTOMY N/A 06/08/2016   Procedure: HYSTERECTOMY TOTAL LAPAROSCOPIC;  Surgeon: Nadara Mustard, MD;  Location: [facility];  Service: Gynecology;  Laterality: N/A;   LAPAROSCOPIC SALPINGO OOPHERECTOMY Bilateral 06/08/2016   Procedure: LAPAROSCOPIC SALPINGO OOPHORECTOMY;  Surgeon: Nadara Mustard, MD;  Location: [facility];  Service: Gynecology;  Laterality: Bilateral;   LAPAROTOMY N/A 06/08/2016   Procedure: LAPAROTOMY;  Surgeon: Nadara Mustard, MD;  Location: [facility];  Service: Gynecology;  Laterality: N/A;    Medical History: Past Medical History:  Diagnosis Date   Asthma    Cardiomyopathy (HCC)    Carpal tunnel syndrome    Carpal tunnel syndrome    Dizziness    Edema    Elevated lipids    GERD (gastroesophageal reflux disease)    Hypertension    Lower extremity edema    Seasonal allergies    Sleep apnea    Wheezing     Family History: Family History  Problem Relation Age of Onset   Diabetes Brother    Hypertension Brother    Breast cancer Neg Hx     Social History   Socioeconomic History   Marital status: Married    Spouse name: Not on file   Number of children: Not on file   Years of education: Not on file   Highest education level: Not on file  Occupational History   Not on file  Tobacco Use   Smoking status: Never   Smokeless tobacco: Current    Types: Chew  Substance and Sexual Activity   Alcohol use: No   Drug use: No   Sexual activity: Never  Other Topics Concern   Not on file  Social History Narrative   ** Merged History Encounter **       Social Determinants of Health   Financial Resource Strain: Not on file  Food Insecurity: Not on file  Transportation Needs: Not on file  Physical Activity: Not on file  Stress: Not on  file  Social Connections: Not on file  Intimate Partner Violence: Not on file      Review of Systems  Constitutional:  Negative for activity change, appetite change, chills, fatigue, fever and unexpected weight change.  HENT: Negative.  Negative for congestion, ear pain, rhinorrhea, sore throat and trouble swallowing.   Eyes: Negative.   Respiratory: Negative.  Negative for cough, chest tightness, shortness of breath and wheezing.   Cardiovascular: Negative.  Negative for chest pain and palpitations.  Gastrointestinal: Negative.  Negative for abdominal pain, blood in stool, constipation, diarrhea, nausea and vomiting.  Endocrine: Negative.   Genitourinary: Negative.  Negative for difficulty urinating, dysuria, frequency, hematuria and urgency.  Musculoskeletal:  Positive for arthralgias and back pain. Negative for joint swelling, myalgias and neck pain.  Skin: Negative.  Negative for rash and wound.  Allergic/Immunologic: Negative.  Negative for immunocompromised state.  Neurological: Negative.  Negative for dizziness, seizures, numbness and headaches.  Hematological: Negative.   Psychiatric/Behavioral: Negative.  Negative for behavioral problems, self-injury and suicidal ideas. The patient is not nervous/anxious.     Vital Signs: BP 130/72   Pulse 73   Temp 98.2 F (36.8 C)   Resp 16   Ht 5\' 3"  (1.6 m)   Wt 259 lb 3.2 oz (117.6 kg)   SpO2 98%   BMI 45.92 kg/m    Physical Exam Vitals reviewed.  Constitutional:      General: She is not in acute distress.    Appearance: Normal appearance. She is obese. She is not ill-appearing.  HENT:     Head: Normocephalic and atraumatic.     Right Ear: Tympanic membrane, ear canal and external ear normal.     Left Ear: Tympanic membrane, ear canal and external ear normal.     Nose: Nose normal. No congestion or rhinorrhea.     Mouth/Throat:     Mouth: Mucous membranes are moist.     Pharynx: Oropharynx is clear. No oropharyngeal  exudate  or posterior oropharyngeal erythema.  Eyes:     Extraocular Movements: Extraocular movements intact.     Conjunctiva/sclera: Conjunctivae normal.     Pupils: Pupils are equal, round, and reactive to light.  Cardiovascular:     Rate and Rhythm: Normal rate and regular rhythm.     Pulses: Normal pulses.     Heart sounds: Normal heart sounds. No murmur heard.    No friction rub. No gallop.  Pulmonary:     Effort: Pulmonary effort is normal. No respiratory distress.     Breath sounds: Normal breath sounds.  Abdominal:     General: Bowel sounds are normal. There is no distension.     Palpations: Abdomen is soft. There is no mass.     Tenderness: There is no abdominal tenderness. There is no guarding or rebound.     Hernia: No hernia is present.  Musculoskeletal:        General: Normal range of motion.     Cervical back: Normal range of motion and neck supple.  Skin:    General: Skin is warm and dry.     Capillary Refill: Capillary refill takes less than 2 seconds.  Neurological:     Mental Status: She is alert and oriented to person, place, and time.  Psychiatric:        Mood and Affect: Mood normal.        Behavior: Behavior normal.        Thought Content: Thought content normal.        Judgment: Judgment normal.        Assessment/Plan: 1. Encounter for routine adult health examination with abnormal findings Age-appropriate preventive screenings and vaccinations discussed, annual physical exam completed. Routine labs for health maintenance lab results discussed with patient and caregiver today. PHM updated.   2. Type 2 diabetes mellitus with hyperglycemia, without long-term current use of insulin (HCC) Stable and controlled, continue trulicity as prescribed.   3. Acquired hypothyroidism Continue levothyroxine as prescribed .  4. Mixed hyperlipidemia Improving on labs, continue atorvastatin as prescribed.   5. Carpal tunnel syndrome, bilateral Followed by  orthopedic surgery   6. Dysuria Routine urinalysis done  - UA/M w/rflx Culture, Routine     General Counseling: Shatira verbalizes understanding of the findings of todays visit and agrees with plan of treatment. I have discussed any further diagnostic evaluation that may be needed or ordered today. We also reviewed her medications today. she has been encouraged to call the office with any questions or concerns that should arise related to todays visit.    Orders Placed This Encounter  Procedures   Microscopic Examination   Urine Culture, Reflex   UA/M w/rflx Culture, Routine    No orders of the defined types were placed in this encounter.   Return in about 3 months (around 01/19/2023) for F/U, Recheck A1C, Birl Lobello PCP.   Total time spent:30 Minutes Time spent includes review of chart, medications, test results, and follow up plan with the patient.   [place] Controlled Substance Database was reviewed by me.  This patient was seen by Sallyanne Kuster, FNP-C in collaboration with Dr. Beverely Risen as a part of collaborative care agreement.  Osmani Kersten R. Tedd Sias, MSN, FNP-C Internal medicine

## 2022-10-22 ENCOUNTER — Encounter: Payer: Self-pay | Admitting: Nurse Practitioner

## 2022-10-23 LAB — UA/M W/RFLX CULTURE, ROUTINE
Bilirubin, UA: NEGATIVE
Glucose, UA: NEGATIVE
Ketones, UA: NEGATIVE
Leukocytes,UA: NEGATIVE
Nitrite, UA: NEGATIVE
Protein,UA: NEGATIVE
RBC, UA: NEGATIVE
Specific Gravity, UA: 1.013 (ref 1.005–1.030)
Urobilinogen, Ur: 0.2 mg/dL (ref 0.2–1.0)
pH, UA: 7 (ref 5.0–7.5)

## 2022-10-23 LAB — URINE CULTURE, REFLEX

## 2022-10-23 LAB — MICROSCOPIC EXAMINATION
Casts: NONE SEEN /lpf
Epithelial Cells (non renal): NONE SEEN /HPF (ref 0–10)
RBC, Urine: NONE SEEN /HPF (ref 0–2)

## 2022-12-04 ENCOUNTER — Other Ambulatory Visit: Payer: Self-pay | Admitting: Internal Medicine

## 2023-01-10 ENCOUNTER — Other Ambulatory Visit: Payer: Self-pay | Admitting: Internal Medicine

## 2023-01-10 MED ORDER — TRULICITY 1.5 MG/0.5ML ~~LOC~~ SOAJ: 1.5000 mg | SUBCUTANEOUS | 1 refills | Status: DC

## 2023-01-10 NOTE — Telephone Encounter (Signed)
Spoke with son she taking trulicity 1.5 mg sent pt had appt next with dfk

## 2023-01-16 ENCOUNTER — Encounter: Payer: Self-pay | Admitting: Internal Medicine

## 2023-01-16 ENCOUNTER — Ambulatory Visit (INDEPENDENT_AMBULATORY_CARE_PROVIDER_SITE_OTHER): Payer: Medicaid Other | Admitting: Internal Medicine

## 2023-01-16 VITALS — BP 112/70 | HR 71 | Temp 97.8°F | Resp 16 | Ht 63.0 in | Wt 258.6 lb

## 2023-01-16 DIAGNOSIS — E782 Mixed hyperlipidemia: Secondary | ICD-10-CM

## 2023-01-16 DIAGNOSIS — N390 Urinary tract infection, site not specified: Secondary | ICD-10-CM

## 2023-01-16 DIAGNOSIS — R300 Dysuria: Secondary | ICD-10-CM | POA: Diagnosis not present

## 2023-01-16 DIAGNOSIS — E1165 Type 2 diabetes mellitus with hyperglycemia: Secondary | ICD-10-CM

## 2023-01-16 DIAGNOSIS — M170 Bilateral primary osteoarthritis of knee: Secondary | ICD-10-CM | POA: Diagnosis not present

## 2023-01-16 DIAGNOSIS — K5901 Slow transit constipation: Secondary | ICD-10-CM

## 2023-01-16 LAB — POCT URINALYSIS DIPSTICK

## 2023-01-16 LAB — POCT GLYCOSYLATED HEMOGLOBIN (HGB A1C): Hemoglobin A1C: 6.1 % — AB (ref 4.0–5.6)

## 2023-01-16 MED ORDER — LINACLOTIDE 72 MCG PO CAPS: ORAL_CAPSULE | ORAL | 12 refills | Status: AC

## 2023-01-16 NOTE — Progress Notes (Signed)
[facility] [address] [place], [place] [address]  Internal MEDICINE  Office Visit Note  Patient Name: Brianna Gray  [id number]  [id number]  Date of Service: 01/16/2023  Chief Complaint  Patient presents with   Follow-up   Gastroesophageal Reflux   Hypertension   Quality Metric Gaps    Flu, Shingles, TDAP and Pneumonia Vaccines due    HPI Pt is seen for routine follow up Pt is C/O pain in her knees, has seen ortho for I/A injection She is not very active  Will like to lose wt  Diabetes and HTN are well controlled     Current Medication: Outpatient Encounter Medications as of 01/16/2023  Medication Sig   Accu-Chek Softclix Lancets lancets Use to check blood sugars twice a day.  E11.65   acetic acid-hydrocortisone (VOSOL-HC) OTIC solution Place 3 drops into both ears 2 (two) times daily. As needed for itching   ADVAIR DISKUS 250-50 MCG/ACT AEPB INHALE 1 PUFF INTO THE LUNGS 2 (TWO) TIMES DAILY AS NEEDED. FOR RESPIRATORY ISSUES.   amLODipine (NORVASC) 5 MG tablet Take 5 mg by mouth daily.   atorvastatin (LIPITOR) 10 MG tablet TAKE 1 TABLET BY MOUTH EVERYDAY AT BEDTIME   atorvastatin (LIPITOR) 10 MG tablet Take 1 tablet (10 mg total) by mouth daily.   augmented betamethasone dipropionate (DIPROLENE) 0.05 % ointment Apply topically 2 (two) times daily.   Blood Glucose Monitoring Suppl (ACCU-CHEK GUIDE ME) w/Device KIT Use to check blood sugars twice a day.    E11.65   Continuous Blood Gluc Receiver (FREESTYLE LIBRE 14 DAY READER) DEVI 1 each by Does not apply route every 14 (fourteen) days. Use every 14 days to monitor blood sugars   E11.65   Continuous Blood Gluc Sensor (FREESTYLE LIBRE 14 DAY SENSOR) MISC 1 each by Does not apply route every 14 (fourteen) days. Use every 14 days to monitor blood sugars   E11.65   Dulaglutide (TRULICITY) 1.5 MG/0.5ML SOAJ Inject 1.5 mg into the skin once a week.   ergocalciferol (VITAMIN D2) 1.25 MG (50000 UT) capsule Take 1  capsule (50,000 Units total) by mouth once a week.   esomeprazole (NEXIUM) 40 MG capsule TAKE 1 CAPSULE BY MOUTH EVERY DAY FOR STOMACH   fluticasone (FLONASE) 50 MCG/ACT nasal spray Place 1-2 sprays into both nostrils daily as needed. For nasal congestion.   gentamicin cream (GARAMYCIN) 0.1 % Apply 1 application topically 2 (two) times daily.   glucose blood (ACCU-CHEK GUIDE) test strip Use as instructed to check blood sugars twice a day  E11.65   hydrALAZINE (APRESOLINE) 25 MG tablet TAKE 1 TABLET BY MOUTH THREE TIMES DAILY   ipratropium-albuterol (DUONEB) 0.5-2.5 (3) MG/3ML SOLN One vial tid prn for asthma, faillure to MDI   levothyroxine (SYNTHROID) 88 MCG tablet TAKE 1 TABLET BY MOUTH EVERY MORNING ON EMPTY STOMACH**OK TO CHANGE TO LANNETT PER MD**   linaclotide (LINZESS) 72 MCG capsule Take one tab po every day for constipation   loratadine (CLARITIN) 10 MG tablet TAKE 1 TABLET BY MOUTH EVERY DAY   losartan (COZAAR) 100 MG tablet TAKE 1 TABLET BY MOUTH EVERY DAY   meclizine (ANTIVERT) 25 MG tablet Take 1 tablet (25 mg total) by mouth 2 (two) times daily as needed for dizziness.   meloxicam (MOBIC) 7.5 MG tablet TAKE 1 TABLET BY MOUTH TWICE A DAY FOR LOWER BACK AND JOINT PAINS   montelukast (SINGULAIR) 10 MG tablet Take 1 tablet (10 mg total) by mouth at bedtime. For sinus congestion  and allergies   neomycin-polymyxin-hydrocortisone (CORTISPORIN) OTIC solution 3-4 drops in both ears bid prn   Potassium 99 MG TABS Take 99 mg by mouth daily at 12 noon.   torsemide (DEMADEX) 20 MG tablet Take 20 mg by mouth daily.   umeclidinium-vilanterol (ANORO ELLIPTA) 62.5-25 MCG/ACT AEPB Inhale 1 puff into the lungs daily at 6 (six) AM.   vitamin B-12 (CYANOCOBALAMIN) 1000 MCG tablet Take 1,000 mcg by mouth daily at 12 noon.   [DISCONTINUED] azithromycin (ZITHROMAX) 250 MG tablet Take one tab a day for 10 days for uri   No facility-administered encounter medications on file as of 01/16/2023.    Surgical  History: Past Surgical History:  Procedure Laterality Date   CHOLECYSTECTOMY     HERNIA REPAIR     HERNIA REPAIR     HYSTEROSCOPY WITH D & C N/A 03/19/2015   Procedure: DILATATION AND CURETTAGE /HYSTEROSCOPY and polypectomy;  Surgeon: Elenora Fender Ward, MD;  Location: [facility];  Service: Gynecology;  Laterality: N/A;   LAPAROSCOPIC HYSTERECTOMY N/A 06/08/2016   Procedure: HYSTERECTOMY TOTAL LAPAROSCOPIC;  Surgeon: Nadara Mustard, MD;  Location: [facility];  Service: Gynecology;  Laterality: N/A;   LAPAROSCOPIC SALPINGO OOPHERECTOMY Bilateral 06/08/2016   Procedure: LAPAROSCOPIC SALPINGO OOPHORECTOMY;  Surgeon: Nadara Mustard, MD;  Location: [facility];  Service: Gynecology;  Laterality: Bilateral;   LAPAROTOMY N/A 06/08/2016   Procedure: LAPAROTOMY;  Surgeon: Nadara Mustard, MD;  Location: [facility];  Service: Gynecology;  Laterality: N/A;    Medical History: Past Medical History:  Diagnosis Date   Asthma    Cardiomyopathy (HCC)    Carpal tunnel syndrome    Carpal tunnel syndrome    Dizziness    Edema    Elevated lipids    GERD (gastroesophageal reflux disease)    Hypertension    Lower extremity edema    Seasonal allergies    Sleep apnea    Wheezing     Family History: Family History  Problem Relation Age of Onset   Diabetes Brother    Hypertension Brother    Breast cancer Neg Hx     Social History   Socioeconomic History   Marital status: Married    Spouse name: Not on file   Number of children: Not on file   Years of education: Not on file   Highest education level: Not on file  Occupational History   Not on file  Tobacco Use   Smoking status: Never   Smokeless tobacco: Current    Types: Chew  Substance and Sexual Activity   Alcohol use: No   Drug use: No   Sexual activity: Never  Other Topics Concern   Not on file  Social History Narrative   ** Merged History Encounter **       Social Determinants of Health   Financial Resource Strain: Not on file  Food  Insecurity: Not on file  Transportation Needs: Not on file  Physical Activity: Not on file  Stress: Not on file  Social Connections: Not on file  Intimate Partner Violence: Not on file      Review of Systems  Constitutional:  Negative for fatigue and fever.  HENT:  Negative for congestion, mouth sores and postnasal drip.   Respiratory:  Negative for cough.   Cardiovascular:  Negative for chest pain.  Genitourinary:  Negative for flank pain.  Musculoskeletal:  Positive for gait problem.  Psychiatric/Behavioral: Negative.      Vital Signs: BP 112/70   Pulse 71  Temp 97.8 F (36.6 C)   Resp 16   Ht 5\' 3"  (1.6 m)   Wt 258 lb 9.6 oz (117.3 kg)   SpO2 97%   BMI 45.81 kg/m    Physical Exam Constitutional:      Appearance: Normal appearance.  HENT:     Head: Normocephalic and atraumatic.     Nose: Nose normal.     Mouth/Throat:     Mouth: Mucous membranes are moist.     Pharynx: No posterior oropharyngeal erythema.  Eyes:     Extraocular Movements: Extraocular movements intact.     Pupils: Pupils are equal, round, and reactive to light.  Cardiovascular:     Pulses: Normal pulses.     Heart sounds: Normal heart sounds.  Pulmonary:     Effort: Pulmonary effort is normal.     Breath sounds: Normal breath sounds.  Neurological:     General: No focal deficit present.     Mental Status: She is alert.  Psychiatric:        Mood and Affect: Mood normal.        Behavior: Behavior normal.    Assessment/Plan: 1. Type 2 diabetes mellitus with hyperglycemia, without long-term current use of insulin (HCC) Continue Trulicity  - POCT HgB A1C - Urine Microalbumin w/creat. ratio  2. Primary osteoarthritis of both knees She will see ortho, continue mobic   3. Urinary tract infection without hematuria, site unspecified Repeat c/s  - POCT Urinalysis Dipstick - CULTURE, URINE COMPREHENSIVE  4. Mixed hyperlipidemia Controlled    5. Slow transit constipation Start  Linzess - linaclotide (LINZESS) 72 MCG capsule; Take one tab po every day for constipation  Dispense: 30 capsule; Refill: 12   General Counseling: Myrella verbalizes understanding of the findings of todays visit and agrees with plan of treatment. I have discussed any further diagnostic evaluation that may be needed or ordered today. We also reviewed her medications today. she has been encouraged to call the office with any questions or concerns that should arise related to todays visit.    Orders Placed This Encounter  Procedures   CULTURE, URINE COMPREHENSIVE   Urine Microalbumin w/creat. ratio   POCT HgB A1C   POCT Urinalysis Dipstick    Meds ordered this encounter  Medications   linaclotide (LINZESS) 72 MCG capsule    Sig: Take one tab po every day for constipation    Dispense:  30 capsule    Refill:  12    Total time spent:35 Minutes Time spent includes review of chart, medications, test results, and follow up plan with the patient.   [place] Controlled Substance Database was reviewed by me.   Dr Lyndon Code Internal medicine

## 2023-01-18 ENCOUNTER — Other Ambulatory Visit: Payer: Self-pay | Admitting: Internal Medicine

## 2023-01-18 ENCOUNTER — Ambulatory Visit: Payer: Medicaid Other | Admitting: Internal Medicine

## 2023-01-18 LAB — MICROALBUMIN / CREATININE URINE RATIO
Creatinine, Urine: 88.7 mg/dL
Microalb/Creat Ratio: 17 mg/g{creat} (ref 0–29)
Microalbumin, Urine: 15.3 ug/mL

## 2023-01-18 MED ORDER — NITROFURANTOIN MONOHYD MACRO 100 MG PO CAPS: ORAL_CAPSULE | ORAL | 0 refills | Status: DC

## 2023-01-18 NOTE — Progress Notes (Signed)
I sent RX for Macrobid 100 mg po bid for 10 days, please let the pt know that she has UTI and needs to drink more water and finish abx

## 2023-01-19 ENCOUNTER — Telehealth: Payer: Self-pay

## 2023-01-19 NOTE — Telephone Encounter (Signed)
-----   Message from [facility] sent at 01/18/2023 10:37 PM EST ----- I sent RX for Macrobid 100 mg po bid for 10 days, please let the pt know that she has UTI and needs to drink more water and finish abx

## 2023-01-19 NOTE — Telephone Encounter (Signed)
Notified patient's son that Macrobid was sent for UTI, per DFK.

## 2023-01-22 LAB — CULTURE, URINE COMPREHENSIVE

## 2023-01-22 NOTE — Progress Notes (Signed)
Let me know pt has been advised

## 2023-01-31 ENCOUNTER — Telehealth: Payer: Self-pay | Admitting: Nurse Practitioner

## 2023-01-31 ENCOUNTER — Other Ambulatory Visit: Payer: Self-pay | Admitting: Internal Medicine

## 2023-01-31 DIAGNOSIS — M170 Bilateral primary osteoarthritis of knee: Secondary | ICD-10-CM

## 2023-01-31 NOTE — Telephone Encounter (Signed)
done

## 2023-01-31 NOTE — Telephone Encounter (Signed)
Patient's son, Jerolyn Center, called stating they were are [facility]'s office. He was told we needed to send referral. I explained to him that provider has not put in referral for one. I sent message to [facility] and [facility]. I told Gwenyth Bouillon he will need to take patient home and wait until referral has been sent-Toni

## 2023-02-01 ENCOUNTER — Telehealth: Payer: Self-pay | Admitting: Nurse Practitioner

## 2023-02-01 NOTE — Telephone Encounter (Signed)
Per son's request, Chiropractic referral faxed to [facility]; [phone]. Notified son-Ton

## 2023-03-02 ENCOUNTER — Other Ambulatory Visit: Payer: Self-pay | Admitting: Internal Medicine

## 2023-03-02 ENCOUNTER — Other Ambulatory Visit: Payer: Self-pay | Admitting: Nurse Practitioner

## 2023-04-02 ENCOUNTER — Other Ambulatory Visit: Payer: Self-pay | Admitting: Nurse Practitioner

## 2023-04-03 ENCOUNTER — Other Ambulatory Visit: Payer: Self-pay

## 2023-04-03 MED ORDER — TRULICITY 1.5 MG/0.5ML ~~LOC~~ SOAJ: 1.5000 mg | SUBCUTANEOUS | 1 refills | Status: DC

## 2023-04-09 ENCOUNTER — Other Ambulatory Visit: Payer: Self-pay | Admitting: Nurse Practitioner

## 2023-04-10 ENCOUNTER — Other Ambulatory Visit: Payer: Self-pay

## 2023-04-10 MED ORDER — TRULICITY 1.5 MG/0.5ML ~~LOC~~ SOAJ: 1.5000 mg | SUBCUTANEOUS | 1 refills | Status: DC

## 2023-04-17 ENCOUNTER — Ambulatory Visit: Payer: Medicaid Other | Admitting: Nurse Practitioner

## 2023-04-17 ENCOUNTER — Encounter: Payer: Self-pay | Admitting: Nurse Practitioner

## 2023-04-17 VITALS — BP 135/70 | HR 61 | Temp 98.3°F | Resp 16 | Ht 63.0 in | Wt 263.2 lb

## 2023-04-17 DIAGNOSIS — M79642 Pain in left hand: Secondary | ICD-10-CM

## 2023-04-17 DIAGNOSIS — R3911 Hesitancy of micturition: Secondary | ICD-10-CM | POA: Diagnosis not present

## 2023-04-17 DIAGNOSIS — H608X3 Other otitis externa, bilateral: Secondary | ICD-10-CM

## 2023-04-17 DIAGNOSIS — R300 Dysuria: Secondary | ICD-10-CM

## 2023-04-17 DIAGNOSIS — M79641 Pain in right hand: Secondary | ICD-10-CM

## 2023-04-17 DIAGNOSIS — Z23 Encounter for immunization: Secondary | ICD-10-CM

## 2023-04-17 DIAGNOSIS — E1169 Type 2 diabetes mellitus with other specified complication: Secondary | ICD-10-CM

## 2023-04-17 LAB — POCT URINALYSIS DIPSTICK
Bilirubin, UA: NEGATIVE
Blood, UA: NEGATIVE
Glucose, UA: NEGATIVE
Ketones, UA: NEGATIVE
Leukocytes, UA: NEGATIVE
Nitrite, UA: NEGATIVE
Protein, UA: NEGATIVE
Spec Grav, UA: 1.015 (ref 1.010–1.025)
Urobilinogen, UA: 0.2 U/dL
pH, UA: 5 (ref 5.0–8.0)

## 2023-04-17 LAB — POCT GLYCOSYLATED HEMOGLOBIN (HGB A1C): Hemoglobin A1C: 6.2 % — AB (ref 4.0–5.6)

## 2023-04-17 MED ORDER — ZOSTER VAC RECOMB ADJUVANTED 50 MCG/0.5ML IM SUSR: 0.5000 mL | Freq: Once | INTRAMUSCULAR | 1 refills | Status: DC | PRN

## 2023-04-17 MED ORDER — CELECOXIB 200 MG PO CAPS: 200.0000 mg | ORAL_CAPSULE | Freq: Two times a day (BID) | ORAL | 3 refills | Status: DC

## 2023-04-17 MED ORDER — HYDROCORTISONE-ACETIC ACID 1-2 % OT SOLN: 3.0000 [drp] | Freq: Two times a day (BID) | OTIC | 0 refills | Status: AC | PRN

## 2023-04-17 MED ORDER — TETANUS-DIPHTH-ACELL PERTUSSIS 5-2-15.5 LF-MCG/0.5 IM SUSP
0.5000 mL | Freq: Once | INTRAMUSCULAR | 0 refills | Status: AC
Start: 2023-04-17 — End: 2023-04-17

## 2023-04-17 MED ORDER — PNEUMOCOCCAL 20-VAL CONJ VACC 0.5 ML IM SUSY: 0.5000 mL | PREFILLED_SYRINGE | Freq: Once | INTRAMUSCULAR | 0 refills | Status: DC | PRN

## 2023-04-17 MED ORDER — AREXVY 120 MCG/0.5ML IM SUSR: 0.5000 mL | Freq: Once | INTRAMUSCULAR | 0 refills | Status: DC | PRN

## 2023-04-17 MED ORDER — INFLUENZA VAC SPLIT HIGH-DOSE 0.5 ML IM SUSY: 0.5000 mL | PREFILLED_SYRINGE | Freq: Once | INTRAMUSCULAR | 0 refills | Status: AC

## 2023-04-17 NOTE — Progress Notes (Signed)
 [facility] [address] [place], [place] [address]  Internal MEDICINE  Office Visit Note  Patient Name: Brianna Gray  [id number]  [id number]  Date of Service: 04/17/2023  Chief Complaint  Patient presents with   Gastroesophageal Reflux   Hypertension   Follow-up    HPI Brianna Gray presents for a follow-up visit for itchy ears, diabetes, arthritis, and decreased urination, due for vaccinations.  Dry itchy ears -- had a prescription for vosol in 2021, this would help to try again.  Diabetes -- has been taking lower dose of 0.75 due to stock issues at pharmacy, recently got the 1.5 mg dose again and will be starting this now. A1c checked today.  Weight gain --  Arthritis in hands and possible trigger finger developing -- has been taking mloexicam 7.5 twice daily but this is not helping. Switch to different medication.  Slow with urination, hesitancy, dribbling -- no UTI, urinalysis normal, may need urology referral at some point.  Due for multiple vaccinations  -- prevnar 20, shingles 2-dose series, Tdap, RSV and flu vaccine.    Current Medication: Outpatient Encounter Medications as of 04/17/2023  Medication Sig   celecoxib (CELEBREX) 200 MG capsule Take 1 capsule (200 mg total) by mouth 2 (two) times daily.   Influenza vac split quadrivalent PF (FLUZONE HIGH-DOSE) 0.5 ML injection Inject 0.5 mLs into the muscle once for 1 dose.   pneumococcal 20-valent conjugate vaccine (PREVNAR 20) 0.5 ML injection Inject 0.5 mLs into the muscle once as needed for up to 1 dose for immunization.   RSV vaccine recomb adjuvanted (AREXVY) 120 MCG/0.5ML injection Inject 0.5 mLs into the muscle once as needed for up to 1 dose (RSV vaccination).   Tdap (ADACEL) 06-28-13.5 LF-MCG/0.5 injection Inject 0.5 mLs into the muscle once for 1 dose.   Zoster Vaccine Adjuvanted [facility]) injection Inject 0.5 mLs into the muscle once as needed for up to 1 dose (shingles vaccination).   Accu-Chek Softclix  Lancets lancets Use to check blood sugars twice a day.  E11.65   acetic acid-hydrocortisone (VOSOL-HC) OTIC solution Place 3 drops into both ears 2 (two) times daily as needed (itchy dry ears).   ADVAIR DISKUS 250-50 MCG/ACT AEPB INHALE 1 PUFF INTO THE LUNGS 2 (TWO) TIMES DAILY AS NEEDED. FOR RESPIRATORY ISSUES.   amLODipine (NORVASC) 5 MG tablet Take 5 mg by mouth daily.   atorvastatin (LIPITOR) 10 MG tablet Take 1 tablet (10 mg total) by mouth daily.   augmented betamethasone dipropionate (DIPROLENE) 0.05 % ointment Apply topically 2 (two) times daily.   Blood Glucose Monitoring Suppl (ACCU-CHEK GUIDE ME) w/Device KIT Use to check blood sugars twice a day.    E11.65   Continuous Blood Gluc Receiver (FREESTYLE LIBRE 14 DAY READER) DEVI 1 each by Does not apply route every 14 (fourteen) days. Use every 14 days to monitor blood sugars   E11.65   Continuous Blood Gluc Sensor (FREESTYLE LIBRE 14 DAY SENSOR) MISC 1 each by Does not apply route every 14 (fourteen) days. Use every 14 days to monitor blood sugars   E11.65   Dulaglutide (TRULICITY) 1.5 MG/0.5ML SOAJ Inject 1.5 mg into the skin once a week.   ergocalciferol (VITAMIN D2) 1.25 MG (50000 UT) capsule Take 1 capsule (50,000 Units total) by mouth once a week.   esomeprazole (NEXIUM) 40 MG capsule TAKE 1 CAPSULE BY MOUTH EVERY DAY FOR STOMACH   fluticasone (FLONASE) 50 MCG/ACT nasal spray Place 1-2 sprays into both nostrils daily as needed. For nasal  congestion.   gentamicin cream (GARAMYCIN) 0.1 % Apply 1 application topically 2 (two) times daily.   glucose blood (ACCU-CHEK GUIDE) test strip Use as instructed to check blood sugars twice a day  E11.65   hydrALAZINE (APRESOLINE) 25 MG tablet TAKE 1 TABLET BY MOUTH THREE TIMES A DAY   ipratropium-albuterol (DUONEB) 0.5-2.5 (3) MG/3ML SOLN One vial tid prn for asthma, faillure to MDI   levothyroxine (SYNTHROID) 88 MCG tablet TAKE 1 TABLET BY MOUTH EVERY MORNING ON EMPTY STOMACH**OK TO CHANGE TO  LANNETT PER MD**   linaclotide (LINZESS) 72 MCG capsule Take one tab po every day for constipation   loratadine (CLARITIN) 10 MG tablet TAKE 1 TABLET BY MOUTH EVERY DAY   losartan (COZAAR) 100 MG tablet TAKE 1 TABLET BY MOUTH EVERY DAY   meclizine (ANTIVERT) 25 MG tablet Take 1 tablet (25 mg total) by mouth 2 (two) times daily as needed for dizziness.   montelukast (SINGULAIR) 10 MG tablet Take 1 tablet (10 mg total) by mouth at bedtime. For sinus congestion and allergies   neomycin-polymyxin-hydrocortisone (CORTISPORIN) OTIC solution 3-4 drops in both ears bid prn   Potassium 99 MG TABS Take 99 mg by mouth daily at 12 noon.   torsemide (DEMADEX) 20 MG tablet Take 20 mg by mouth daily.   umeclidinium-vilanterol (ANORO ELLIPTA) 62.5-25 MCG/ACT AEPB Inhale 1 puff into the lungs daily at 6 (six) AM.   vitamin B-12 (CYANOCOBALAMIN) 1000 MCG tablet Take 1,000 mcg by mouth daily at 12 noon.   [DISCONTINUED] acetic acid-hydrocortisone (VOSOL-HC) OTIC solution Place 3 drops into both ears 2 (two) times daily. As needed for itching   [DISCONTINUED] atorvastatin (LIPITOR) 10 MG tablet TAKE 1 TABLET BY MOUTH EVERYDAY AT BEDTIME   [DISCONTINUED] meloxicam (MOBIC) 7.5 MG tablet TAKE 1 TABLET BY MOUTH TWICE A DAY FOR LOWER BACK AND JOINT PAINS   [DISCONTINUED] nitrofurantoin, macrocrystal-monohydrate, (MACROBID) 100 MG capsule Use as instructed   No facility-administered encounter medications on file as of 04/17/2023.    Surgical History: Past Surgical History:  Procedure Laterality Date   CHOLECYSTECTOMY     HERNIA REPAIR     HERNIA REPAIR     HYSTEROSCOPY WITH D & C N/A 03/19/2015   Procedure: DILATATION AND CURETTAGE /HYSTEROSCOPY and polypectomy;  Surgeon: Elenora Fender Ward, MD;  Location: [facility];  Service: Gynecology;  Laterality: N/A;   LAPAROSCOPIC HYSTERECTOMY N/A 06/08/2016   Procedure: HYSTERECTOMY TOTAL LAPAROSCOPIC;  Surgeon: Nadara Mustard, MD;  Location: [facility];  Service: Gynecology;   Laterality: N/A;   LAPAROSCOPIC SALPINGO OOPHERECTOMY Bilateral 06/08/2016   Procedure: LAPAROSCOPIC SALPINGO OOPHORECTOMY;  Surgeon: Nadara Mustard, MD;  Location: [facility];  Service: Gynecology;  Laterality: Bilateral;   LAPAROTOMY N/A 06/08/2016   Procedure: LAPAROTOMY;  Surgeon: Nadara Mustard, MD;  Location: [facility];  Service: Gynecology;  Laterality: N/A;    Medical History: Past Medical History:  Diagnosis Date   Asthma    Cardiomyopathy (HCC)    Carpal tunnel syndrome    Carpal tunnel syndrome    Dizziness    Edema    Elevated lipids    GERD (gastroesophageal reflux disease)    Hypertension    Lower extremity edema    Seasonal allergies    Sleep apnea    Wheezing     Family History: Family History  Problem Relation Age of Onset   Diabetes Brother    Hypertension Brother    Breast cancer Neg Hx     Social History  Socioeconomic History   Marital status: Married    Spouse name: Not on file   Number of children: Not on file   Years of education: Not on file   Highest education level: Not on file  Occupational History   Not on file  Tobacco Use   Smoking status: Never   Smokeless tobacco: Current    Types: Chew  Substance and Sexual Activity   Alcohol use: No   Drug use: No   Sexual activity: Never  Other Topics Concern   Not on file  Social History Narrative   ** Merged History Encounter **       Social Drivers of Corporate investment banker Strain: Not on file  Food Insecurity: Not on file  Transportation Needs: Not on file  Physical Activity: Not on file  Stress: Not on file  Social Connections: Not on file  Intimate Partner Violence: Not on file      Review of Systems  Constitutional: Negative.  Negative for fatigue.  HENT: Negative.    Respiratory: Negative.  Negative for cough, chest tightness, shortness of breath and wheezing.   Cardiovascular: Negative.  Negative for chest pain and palpitations.  Gastrointestinal: Negative.   Negative for vomiting.  Genitourinary:  Positive for decreased urine volume, difficulty urinating (urinary hesitancy) and dysuria.  Musculoskeletal:  Positive for arthralgias (arthritis of hands, feels like fingers get stuck, difficult to open hand from a closed fist.), gait problem (uses cane) and myalgias.  Neurological:  Positive for weakness (in bilateral hands) and numbness (bilateral hands).  Psychiatric/Behavioral: Negative.      Vital Signs: BP 135/70 Comment: 147/75  Pulse 61   Temp 98.3 F (36.8 C)   Resp 16   Ht 5\' 3"  (1.6 m)   Wt 263 lb 3.2 oz (119.4 kg)   SpO2 98%   BMI 46.62 kg/m    Physical Exam Vitals reviewed.  Constitutional:      General: She is not in acute distress.    Appearance: Normal appearance. She is obese. She is not ill-appearing.  HENT:     Head: Normocephalic and atraumatic.  Eyes:     Pupils: Pupils are equal, round, and reactive to light.  Cardiovascular:     Rate and Rhythm: Normal rate and regular rhythm.  Pulmonary:     Effort: Pulmonary effort is normal. No respiratory distress.  Neurological:     Mental Status: She is alert and oriented to person, place, and time.  Psychiatric:        Mood and Affect: Mood normal.        Behavior: Behavior normal.        Assessment/Plan: 1. Type 2 diabetes mellitus with other specified complication, without long-term current use of insulin (HCC) (Primary) A1c remains stable at 6.2. patient will increase to trulicity 1.5 mg dose now that her pharmacy has it in stock again. Urine sent for microalbumin/creatinine ratio.  - POCT glycosylated hemoglobin (Hb A1C) - Urine Microalbumin w/creat. ratio  2. Chronic eczematous otitis externa of both ears Previously had ears drops to help with this problem, vosol drops prescribed - acetic acid-hydrocortisone (VOSOL-HC) OTIC solution; Place 3 drops into both ears 2 (two) times daily as needed (itchy dry ears).  Dispense: 10 mL; Refill: 0  3. Urinary  hesitancy Urinalysis was normal. May need to see urology for further evaluation and treatment. Patient and her son will discuss with family to determine if she will go to urologist.  - POCT Urinalysis Dipstick  4. Bilateral hand pain Meloxicam discontinued, start celebrex as prescribed.  - celecoxib (CELEBREX) 200 MG capsule; Take 1 capsule (200 mg total) by mouth 2 (two) times daily.  Dispense: 60 capsule; Refill: 3  5. Dysuria Urinalysis was normal, no UTI - POCT Urinalysis Dipstick  6. Need for vaccination Vaccines ordered and sent to her pharmacy. Information about each vaccine given to patient. And patient and caregiver encouraged to discuss vaccinations with her pharmacist and determine an appropriate schedule to get all of the vaccinations administered.  - pneumococcal 20-valent conjugate vaccine (PREVNAR 20) 0.5 ML injection; Inject 0.5 mLs into the muscle once as needed for up to 1 dose for immunization.  Dispense: 0.5 mL; Refill: 0 - Zoster Vaccine Adjuvanted [facility]) injection; Inject 0.5 mLs into the muscle once as needed for up to 1 dose (shingles vaccination).  Dispense: 0.5 mL; Refill: 1 - Influenza vac split quadrivalent PF (FLUZONE HIGH-DOSE) 0.5 ML injection; Inject 0.5 mLs into the muscle once for 1 dose.  Dispense: 0.5 mL; Refill: 0 - Tdap (ADACEL) 06-28-13.5 LF-MCG/0.5 injection; Inject 0.5 mLs into the muscle once for 1 dose.  Dispense: 0.5 mL; Refill: 0 - RSV vaccine recomb adjuvanted (AREXVY) 120 MCG/0.5ML injection; Inject 0.5 mLs into the muscle once as needed for up to 1 dose (RSV vaccination).  Dispense: 0.5 mL; Refill: 0   General Counseling: Brianna Gray verbalizes understanding of the findings of todays visit and agrees with plan of treatment. I have discussed any further diagnostic evaluation that may be needed or ordered today. We also reviewed her medications today. she has been encouraged to call the office with any questions or concerns that should arise related to  todays visit.    Orders Placed This Encounter  Procedures   Urine Microalbumin w/creat. ratio   POCT glycosylated hemoglobin (Hb A1C)   POCT Urinalysis Dipstick    Meds ordered this encounter  Medications   celecoxib (CELEBREX) 200 MG capsule    Sig: Take 1 capsule (200 mg total) by mouth 2 (two) times daily.    Dispense:  60 capsule    Refill:  3    Discontinue meloxicam, fill new script today   acetic acid-hydrocortisone (VOSOL-HC) OTIC solution    Sig: Place 3 drops into both ears 2 (two) times daily as needed (itchy dry ears).    Dispense:  10 mL    Refill:  0   pneumococcal 20-valent conjugate vaccine (PREVNAR 20) 0.5 ML injection    Sig: Inject 0.5 mLs into the muscle once as needed for up to 1 dose for immunization.    Dispense:  0.5 mL    Refill:  0    Due for prevnar 20   Zoster Vaccine Adjuvanted [facility]) injection    Sig: Inject 0.5 mLs into the muscle once as needed for up to 1 dose (shingles vaccination).    Dispense:  0.5 mL    Refill:  1    Due for 2 dose series   Influenza vac split quadrivalent PF (FLUZONE HIGH-DOSE) 0.5 ML injection    Sig: Inject 0.5 mLs into the muscle once for 1 dose.    Dispense:  0.5 mL    Refill:  0   Tdap (ADACEL) 06-28-13.5 LF-MCG/0.5 injection    Sig: Inject 0.5 mLs into the muscle once for 1 dose.    Dispense:  0.5 mL    Refill:  0   RSV vaccine recomb adjuvanted (AREXVY) 120 MCG/0.5ML injection    Sig: Inject 0.5 mLs into  the muscle once as needed for up to 1 dose (RSV vaccination).    Dispense:  0.5 mL    Refill:  0    Return for previously scheduled, AWV, Brianna Gray PCP in august.   Total time spent:30 Minutes Time spent includes review of chart, medications, test results, and follow up plan with the patient.   [place] Controlled Substance Database was reviewed by me.  This patient was seen by Sallyanne Kuster, FNP-C in collaboration with Dr. Beverely Risen as a part of collaborative care agreement.   Halbert Jesson R. Tedd Sias,  MSN, FNP-C Internal medicine

## 2023-04-19 LAB — MICROALBUMIN / CREATININE URINE RATIO
Creatinine, Urine: 83.5 mg/dL
Microalb/Creat Ratio: 6 mg/g{creat} (ref 0–29)
Microalbumin, Urine: 4.8 ug/mL

## 2023-07-15 ENCOUNTER — Other Ambulatory Visit: Payer: Self-pay | Admitting: Internal Medicine

## 2023-07-15 DIAGNOSIS — J45909 Unspecified asthma, uncomplicated: Secondary | ICD-10-CM

## 2023-08-11 ENCOUNTER — Other Ambulatory Visit: Payer: Self-pay | Admitting: Internal Medicine

## 2023-09-22 ENCOUNTER — Other Ambulatory Visit: Payer: Self-pay | Admitting: Nurse Practitioner

## 2023-09-22 DIAGNOSIS — M79642 Pain in left hand: Secondary | ICD-10-CM

## 2023-09-29 ENCOUNTER — Other Ambulatory Visit: Payer: Self-pay | Admitting: Nurse Practitioner

## 2023-10-10 ENCOUNTER — Other Ambulatory Visit: Payer: Self-pay | Admitting: Internal Medicine

## 2023-10-23 ENCOUNTER — Encounter: Payer: Medicaid Other | Admitting: Nurse Practitioner

## 2023-10-25 ENCOUNTER — Encounter: Payer: Medicaid Other | Admitting: Nurse Practitioner

## 2023-10-30 ENCOUNTER — Telehealth: Payer: Self-pay | Admitting: Nurse Practitioner

## 2023-10-30 NOTE — Telephone Encounter (Signed)
 Left vm to confirm 11/06/23 appointment-Toni

## 2023-11-06 ENCOUNTER — Ambulatory Visit
Admission: RE | Admit: 2023-11-06 | Discharge: 2023-11-06 | Disposition: A | Discharge status: Home / Self Care | Source: Ambulatory Visit | Attending: Internal Medicine | Admitting: Internal Medicine

## 2023-11-06 ENCOUNTER — Encounter: Payer: Self-pay | Admitting: Internal Medicine

## 2023-11-06 ENCOUNTER — Ambulatory Visit
Admission: RE | Admit: 2023-11-06 | Discharge: 2023-11-06 | Disposition: A | Discharge status: Home / Self Care | Attending: Internal Medicine | Admitting: Internal Medicine

## 2023-11-06 ENCOUNTER — Ambulatory Visit (INDEPENDENT_AMBULATORY_CARE_PROVIDER_SITE_OTHER): Admitting: Internal Medicine

## 2023-11-06 VITALS — BP 135/65 | HR 67 | Temp 97.8°F | Resp 16 | Ht 63.0 in | Wt 259.0 lb

## 2023-11-06 DIAGNOSIS — I10 Essential (primary) hypertension: Secondary | ICD-10-CM

## 2023-11-06 DIAGNOSIS — M170 Bilateral primary osteoarthritis of knee: Secondary | ICD-10-CM | POA: Diagnosis present

## 2023-11-06 DIAGNOSIS — E038 Other specified hypothyroidism: Secondary | ICD-10-CM | POA: Insufficient documentation

## 2023-11-06 DIAGNOSIS — I50813 Acute on chronic right heart failure: Secondary | ICD-10-CM | POA: Insufficient documentation

## 2023-11-06 DIAGNOSIS — E785 Hyperlipidemia, unspecified: Secondary | ICD-10-CM

## 2023-11-06 DIAGNOSIS — G4733 Obstructive sleep apnea (adult) (pediatric): Secondary | ICD-10-CM

## 2023-11-06 DIAGNOSIS — L308 Other specified dermatitis: Secondary | ICD-10-CM

## 2023-11-06 DIAGNOSIS — Z0001 Encounter for general adult medical examination with abnormal findings: Secondary | ICD-10-CM | POA: Diagnosis not present

## 2023-11-06 DIAGNOSIS — E1169 Type 2 diabetes mellitus with other specified complication: Secondary | ICD-10-CM

## 2023-11-06 LAB — POCT GLYCOSYLATED HEMOGLOBIN (HGB A1C): Hemoglobin A1C: 6 % — AB (ref 4.0–5.6)

## 2023-11-06 MED ORDER — LOSARTAN POTASSIUM-HCTZ 100-25 MG PO TABS: 1.0000 | ORAL_TABLET | Freq: Every day | ORAL | 3 refills | Status: AC

## 2023-11-06 MED ORDER — BETAMETHASONE DIPROPIONATE AUG 0.05 % EX OINT: TOPICAL_OINTMENT | Freq: Two times a day (BID) | CUTANEOUS | 0 refills | Status: AC

## 2023-11-06 MED ORDER — TORSEMIDE 20 MG PO TABS: 20.0000 mg | ORAL_TABLET | Freq: Every day | ORAL | 3 refills | Status: AC

## 2023-11-06 NOTE — Progress Notes (Signed)
 [facility] [address] [place], [place] [address]  Internal MEDICINE  Office Visit Note  Patient Name: Brianna Gray  [id number]  [id number]  Date of Service: 11/06/2023  Chief Complaint  Patient presents with   Annual Exam   Gastroesophageal Reflux   Hypertension   Quality Metric Gaps    Labs for eGFR     HPI Pt is here for routine health maintenance examination She is c/o weight gain, has been on Trulicity  for a while  BLE was noticed, pt was unaware, limited mobility C/O knee and hand pain, difficulty walking  Has not been taking Demadex  for over a year now  Takes all medicines as prescribed  Uses CPAP off and on, has not seen anyone in sleep clinic , might need Oxygen  Current Medication: Outpatient Encounter Medications as of 11/06/2023  Medication Sig   Accu-Chek Softclix Lancets lancets Use to check blood sugars twice a day.  E11.65   acetic acid -hydrocortisone  (VOSOL -HC) OTIC solution Place 3 drops into both ears 2 (two) times daily as needed (itchy dry ears).   ADVAIR  DISKUS 250-50 MCG/ACT AEPB INHALE 1 PUFF INTO THE LUNGS 2 (TWO) TIMES DAILY AS NEEDED. FOR RESPIRATORY ISSUES.   amLODipine (NORVASC) 5 MG tablet Take 5 mg by mouth daily.   atorvastatin  (LIPITOR) 10 MG tablet TAKE 1 TABLET BY MOUTH EVERY DAY   Blood Glucose Monitoring Suppl (ACCU-CHEK GUIDE ME) w/Device KIT Use to check blood sugars twice a day.    E11.65   celecoxib  (CELEBREX ) 200 MG capsule TAKE 1 CAPSULE BY MOUTH TWICE A DAY   Continuous Blood Gluc Receiver (FREESTYLE LIBRE 14 DAY READER) DEVI 1 each by Does not apply route every 14 (fourteen) days. Use every 14 days to monitor blood sugars   E11.65   Continuous Blood Gluc Sensor (FREESTYLE LIBRE 14 DAY SENSOR) MISC 1 each by Does not apply route every 14 (fourteen) days. Use every 14 days to monitor blood sugars   E11.65   Dulaglutide  (TRULICITY ) 1.5 MG/0.5ML SOAJ Inject 1.5 mg into the skin once a week.   ergocalciferol  (VITAMIN D2)  1.25 MG (50000 UT) capsule Take 1 capsule (50,000 Units total) by mouth once a week.   esomeprazole  (NEXIUM ) 40 MG capsule TAKE 1 CAPSULE BY MOUTH EVERY DAY FOR STOMACH   fluticasone  (FLONASE ) 50 MCG/ACT nasal spray Place 1-2 sprays into both nostrils daily as needed. For nasal congestion.   gentamicin  cream (GARAMYCIN ) 0.1 % Apply 1 application topically 2 (two) times daily.   glucose blood (ACCU-CHEK GUIDE) test strip Use as instructed to check blood sugars twice a day  E11.65   hydrALAZINE  (APRESOLINE ) 25 MG tablet TAKE 1 TABLET BY MOUTH THREE TIMES A DAY   ipratropium-albuterol  (DUONEB) 0.5-2.5 (3) MG/3ML SOLN One vial tid prn for asthma, faillure to MDI   levothyroxine  (SYNTHROID ) 88 MCG tablet TAKE 1 TABLET BY MOUTH EVERY MORNING ON EMPTY STOMACH**OK TO CHANGE TO LANNETT PER MD**   linaclotide  (LINZESS ) 72 MCG capsule Take one tab po every day for constipation   loratadine  (CLARITIN ) 10 MG tablet TAKE 1 TABLET BY MOUTH EVERY DAY   losartan -hydrochlorothiazide  (HYZAAR) 100-25 MG tablet Take 1 tablet by mouth daily.   meclizine  (ANTIVERT ) 25 MG tablet Take 1 tablet (25 mg total) by mouth 2 (two) times daily as needed for dizziness.   montelukast  (SINGULAIR ) 10 MG tablet Take 1 tablet (10 mg total) by mouth at bedtime. For sinus congestion and allergies   neomycin -polymyxin-hydrocortisone  (CORTISPORIN) OTIC solution 3-4 drops  in both ears bid prn   pneumococcal 20-valent conjugate vaccine (PREVNAR 20) 0.5 ML injection Inject 0.5 mLs into the muscle once as needed for up to 1 dose for immunization.   Potassium 99 MG TABS Take 99 mg by mouth daily at 12 noon.   RSV vaccine recomb adjuvanted (AREXVY ) 120 MCG/0.5ML injection Inject 0.5 mLs into the muscle once as needed for up to 1 dose (RSV vaccination).   umeclidinium-vilanterol (ANORO ELLIPTA ) 62.5-25 MCG/ACT AEPB Inhale 1 puff into the lungs daily at 6 (six) AM.   vitamin B-12 (CYANOCOBALAMIN) 1000 MCG tablet Take 1,000 mcg by mouth daily at 12  noon.   Zoster Vaccine Adjuvanted [facility]) injection Inject 0.5 mLs into the muscle once as needed for up to 1 dose (shingles vaccination).   [DISCONTINUED] augmented betamethasone  dipropionate (DIPROLENE ) 0.05 % ointment Apply topically 2 (two) times daily.   [DISCONTINUED] losartan  (COZAAR ) 100 MG tablet TAKE 1 TABLET BY MOUTH EVERY DAY   [DISCONTINUED] torsemide  (DEMADEX ) 20 MG tablet Take 20 mg by mouth daily.   augmented betamethasone  dipropionate (DIPROLENE ) 0.05 % ointment Apply topically 2 (two) times daily.   torsemide  (DEMADEX ) 20 MG tablet Take 1 tablet (20 mg total) by mouth daily.   No facility-administered encounter medications on file as of 11/06/2023.    Surgical History: Past Surgical History:  Procedure Laterality Date   CHOLECYSTECTOMY     HERNIA REPAIR     HERNIA REPAIR     HYSTEROSCOPY WITH D & C N/A 03/19/2015   Procedure: DILATATION AND CURETTAGE /HYSTEROSCOPY and polypectomy;  Surgeon: Mitzie BROCKS Ward, MD;  Location: [facility] ORS;  Service: Gynecology;  Laterality: N/A;   LAPAROSCOPIC HYSTERECTOMY N/A 06/08/2016   Procedure: HYSTERECTOMY TOTAL LAPAROSCOPIC;  Surgeon: Lamar SHAUNNA Lesches, MD;  Location: [facility] OR[facility];  Service: Gynecology;  Laterality: N/A;   LAPAROSCOPIC SALPINGO OOPHERECTOMY Bilateral 06/08/2016   Procedure: LAPAROSCOPIC SALPINGO OOPHORECTOMY;  Surgeon: Lamar SHAUNNA Lesches, MD;  Location: [facility] OR[facility];  Service: Gynecology;  Laterality: Bilateral;   LAPAROTOMY N/A 06/08/2016   Procedure: LAPAROTOMY;  Surgeon: Lamar SHAUNNA Lesches, MD;  Location: [facility] OR[facility];  Service: Gynecology;  Laterality: N/A;    Medical History: Past Medical History:  Diagnosis Date   Asthma    Cardiomyopathy (HCC)    Carpal tunnel syndrome    Carpal tunnel syndrome    Dizziness    Edema    Elevated lipids    GERD (gastroesophageal reflux disease)    Hypertension    Lower extremity edema    Seasonal allergies    Sleep apnea    Wheezing     Family History: Family History  Problem Relation  Age of Onset   Diabetes Brother    Hypertension Brother    Breast cancer Neg Hx     Social History: Social History   Socioeconomic History   Marital status: Married    Spouse name: Not on file   Number of children: Not on file   Years of education: Not on file   Highest education level: Not on file  Occupational History   Not on file  Tobacco Use   Smoking status: Never   Smokeless tobacco: Current    Types: Chew  Substance and Sexual Activity   Alcohol use: No   Drug use: No   Sexual activity: Never  Other Topics Concern   Not on file  Social History Narrative   ** Merged History Encounter **       Social Drivers of Corporate investment banker  Strain: Not on file  Food Insecurity: Not on file  Transportation Needs: Not on file  Physical Activity: Not on file  Stress: Not on file  Social Connections: Not on file      Review of Systems  Constitutional:  Negative for fatigue and fever.  HENT:  Negative for congestion, mouth sores and postnasal drip.   Respiratory:  Positive for shortness of breath. Negative for cough.   Cardiovascular:  Positive for leg swelling. Negative for chest pain.  Genitourinary:  Positive for urgency. Negative for flank pain.  Musculoskeletal:  Positive for arthralgias, gait problem and joint swelling.  Skin:  Positive for rash.  Psychiatric/Behavioral: Negative.       Vital Signs: BP 135/65   Pulse 67   Temp 97.8 F (36.6 C)   Resp 16   Ht 5' 3 (1.6 m)   Wt 259 lb (117.5 kg)   SpO2 97%   BMI 45.88 kg/m    Physical Exam Constitutional:      Appearance: Normal appearance. She is obese.  HENT:     Head: Normocephalic and atraumatic.     Right Ear: Tympanic membrane normal.     Left Ear: Tympanic membrane normal.     Nose: Nose normal.     Mouth/Throat:     Mouth: Mucous membranes are moist.     Pharynx: No posterior oropharyngeal erythema.  Eyes:     Extraocular Movements: Extraocular movements intact.      Conjunctiva/sclera: Conjunctivae normal.     Pupils: Pupils are equal, round, and reactive to light.  Cardiovascular:     Rate and Rhythm: Normal rate.     Pulses: Normal pulses.     Heart sounds: Normal heart sounds.  Pulmonary:     Effort: Pulmonary effort is normal.     Breath sounds: Rales present.     Comments: Decreased air entry with ronchi Abdominal:     Palpations: Abdomen is soft.  Musculoskeletal:        General: Swelling and tenderness present.     Cervical back: Normal range of motion.  Neurological:     General: No focal deficit present.     Mental Status: She is alert.  Psychiatric:        Mood and Affect: Mood normal.        Behavior: Behavior normal.      LABS: Recent Results (from the past 2160 hours)  POCT HgB A1C     Status: Abnormal   Collection Time: 11/06/23 10:06 AM  Result Value Ref Range   Hemoglobin A1C 6.0 (A) 4.0 - 5.6 %   HbA1c POC (<> result, manual entry)     HbA1c, POC (prediabetic range)     HbA1c, POC (controlled diabetic range)          Assessment/Plan: 1. Encounter for general adult medical examination with abnormal findings (Primary) All PHM is updated   2. Type 2 diabetes mellitus with other specified complication, without long-term current use of insulin (HCC) Continue Trulicity  as before  - POCT HgB A1C 6.0   3. Acute on chronic right-sided congestive heart failure (HCC) Restart Demadex  20 mg po every day. Will check labs on Friday  - DG Chest 2 View; Future - Comprehensive metabolic panel with GFR - Ambulatory referral to Cardiology  4. Hyperlipidemia associated with type 2 diabetes mellitus (HCC) - Continue Lipitor as before   5. Other specified hypothyroidism - Continue Synthroid  as before   6. Benign hypertension - continue Losartan /hydrochlorothiazide   and hydralazine   -   7. Primary osteoarthritis of both knees Will need to see ortho   8. Other eczema - augmented betamethasone  dipropionate (DIPROLENE )  0.05 % ointment; Apply topically 2 (two) times daily.  Dispense: 30 g; Refill: 0  9. OSA on CPAP Pt will need downloads and ono    General Counseling: Sumie verbalizes understanding of the findings of todays visit and agrees with plan of treatment. I have discussed any further diagnostic evaluation that may be needed or ordered today. We also reviewed her medications today. she has been encouraged to call the office with any questions or concerns that should arise related to todays visit.    Counseling:  [place] Controlled Substance Database was reviewed by me.  Orders Placed This Encounter  Procedures   DG Chest 2 View   CBC with Differential/Platelet   Lipid Panel With LDL/HDL Ratio   TSH   T4, free   Comprehensive metabolic panel with GFR   Ambulatory referral to Cardiology   POCT HgB A1C    Meds ordered this encounter  Medications   torsemide  (DEMADEX ) 20 MG tablet    Sig: Take 1 tablet (20 mg total) by mouth daily.    Dispense:  90 tablet    Refill:  3   augmented betamethasone  dipropionate (DIPROLENE ) 0.05 % ointment    Sig: Apply topically 2 (two) times daily.    Dispense:  30 g    Refill:  0   losartan -hydrochlorothiazide  (HYZAAR) 100-25 MG tablet    Sig: Take 1 tablet by mouth daily.    Dispense:  90 tablet    Refill:  3    Total time spent:45 Minutes  Time spent includes review of chart, medications, test results, and follow up plan with the patient.     Sigrid CHRISTELLA Bathe, MD  Internal Medicine

## 2023-11-10 LAB — LIPID PANEL WITH LDL/HDL RATIO
Cholesterol, Total: 161 mg/dL (ref 100–199)
HDL: 48 mg/dL (ref >39)
LDL Chol Calc (NIH): 85 mg/dL (ref 0–99)
LDL/HDL Ratio: 1.8 ratio (ref 0.0–3.2)
Triglycerides: 161 mg/dL — ABNORMAL HIGH (ref 0–149)
VLDL Cholesterol Cal: 28 mg/dL (ref 5–40)

## 2023-11-10 LAB — COMPREHENSIVE METABOLIC PANEL WITH GFR
ALT: 21 IU/L (ref 0–32)
AST: 19 IU/L (ref 0–40)
Albumin: 4.1 g/dL (ref 3.8–4.8)
Alkaline Phosphatase: 100 IU/L (ref 44–121)
BUN/Creatinine Ratio: 21 (ref 12–28)
BUN: 23 mg/dL (ref 8–27)
Bilirubin Total: 0.3 mg/dL (ref 0.0–1.2)
CO2: 25 mmol/L (ref 20–29)
Calcium: 9.9 mg/dL (ref 8.7–10.3)
Chloride: 98 mmol/L (ref 96–106)
Creatinine, Ser: 1.09 mg/dL — ABNORMAL HIGH (ref 0.57–1.00)
Globulin, Total: 2.8 g/dL (ref 1.5–4.5)
Glucose: 92 mg/dL (ref 70–99)
Potassium: 3.8 mmol/L (ref 3.5–5.2)
Sodium: 137 mmol/L (ref 134–144)
Total Protein: 6.9 g/dL (ref 6.0–8.5)
eGFR: 53 mL/min/1.73 — ABNORMAL LOW (ref >59)

## 2023-11-10 LAB — CBC WITH DIFFERENTIAL/PLATELET
Basophils Absolute: 0 x10E3/uL (ref 0.0–0.2)
Basos: 0 %
EOS (ABSOLUTE): 0.3 x10E3/uL (ref 0.0–0.4)
Eos: 3 %
Hematocrit: 38 % (ref 34.0–46.6)
Hemoglobin: 11.4 g/dL (ref 11.1–15.9)
Immature Grans (Abs): 0 x10E3/uL (ref 0.0–0.1)
Immature Granulocytes: 0 %
Lymphocytes Absolute: 1.8 x10E3/uL (ref 0.7–3.1)
Lymphs: 20 %
MCH: 24.3 pg — ABNORMAL LOW (ref 26.6–33.0)
MCHC: 30 g/dL — ABNORMAL LOW (ref 31.5–35.7)
MCV: 81 fL (ref 79–97)
Monocytes Absolute: 0.7 x10E3/uL (ref 0.1–0.9)
Monocytes: 8 %
Neutrophils Absolute: 5.9 x10E3/uL (ref 1.4–7.0)
Neutrophils: 69 %
Platelets: 296 x10E3/uL (ref 150–450)
RBC: 4.69 x10E6/uL (ref 3.77–5.28)
RDW: 14.6 % (ref 11.7–15.4)
WBC: 8.7 x10E3/uL (ref 3.4–10.8)

## 2023-11-10 LAB — T4, FREE: Free T4: 1.08 ng/dL (ref 0.82–1.77)

## 2023-11-10 LAB — TSH: TSH: 2.39 u[IU]/mL (ref 0.450–4.500)

## 2023-11-16 ENCOUNTER — Telehealth: Payer: Self-pay

## 2023-11-19 NOTE — Telephone Encounter (Signed)
 Pt son advised about labs result and discuss in detail at next visit

## 2023-12-04 ENCOUNTER — Ambulatory Visit (INDEPENDENT_AMBULATORY_CARE_PROVIDER_SITE_OTHER): Admitting: Internal Medicine

## 2023-12-04 ENCOUNTER — Telehealth: Payer: Self-pay | Admitting: Internal Medicine

## 2023-12-04 ENCOUNTER — Encounter: Payer: Self-pay | Admitting: Internal Medicine

## 2023-12-04 VITALS — BP 110/68 | HR 66 | Temp 97.8°F | Resp 16 | Ht 63.0 in | Wt 257.0 lb

## 2023-12-04 DIAGNOSIS — M25561 Pain in right knee: Secondary | ICD-10-CM | POA: Diagnosis not present

## 2023-12-04 DIAGNOSIS — G8929 Other chronic pain: Secondary | ICD-10-CM

## 2023-12-04 DIAGNOSIS — E1165 Type 2 diabetes mellitus with hyperglycemia: Secondary | ICD-10-CM

## 2023-12-04 DIAGNOSIS — G4733 Obstructive sleep apnea (adult) (pediatric): Secondary | ICD-10-CM | POA: Diagnosis not present

## 2023-12-04 DIAGNOSIS — J4541 Moderate persistent asthma with (acute) exacerbation: Secondary | ICD-10-CM

## 2023-12-04 DIAGNOSIS — E66813 Obesity, class 3: Secondary | ICD-10-CM | POA: Diagnosis not present

## 2023-12-04 DIAGNOSIS — Z6841 Body Mass Index (BMI) 40.0 and over, adult: Secondary | ICD-10-CM

## 2023-12-04 DIAGNOSIS — M25562 Pain in left knee: Secondary | ICD-10-CM

## 2023-12-04 NOTE — Telephone Encounter (Addendum)
 Order emailed to [facility] for appointment with Sarah regarding cpap issues. Sent staff msg to [facility] for today's office notes-Toni

## 2023-12-04 NOTE — Progress Notes (Addendum)
 [facility] [address] [place], [place] [address]  Internal MEDICINE  Office Visit Note  Patient Name: Brianna Gray  [id number]  [id number]  Date of Service: 12/11/2023  Chief Complaint  Patient presents with   Follow-up   Gastroesophageal Reflux   Hypertension   Diabetes    HPI Pt is seen for f/u, son in the room  BLE improved, still on lasix , will get bmp Continues to have knee pain, difficulty walking  Diabetes and bp is well controlled  Pt is having problem with CPAP machine    Current Medication: Outpatient Encounter Medications as of 12/04/2023  Medication Sig   Accu-Chek Softclix Lancets lancets Use to check blood sugars twice a day.  E11.65   acetic acid -hydrocortisone  (VOSOL -HC) OTIC solution Place 3 drops into both ears 2 (two) times daily as needed (itchy dry ears).   ADVAIR  DISKUS 250-50 MCG/ACT AEPB INHALE 1 PUFF INTO THE LUNGS 2 (TWO) TIMES DAILY AS NEEDED. FOR RESPIRATORY ISSUES.   amLODipine (NORVASC) 5 MG tablet Take 5 mg by mouth daily.   atorvastatin  (LIPITOR) 10 MG tablet TAKE 1 TABLET BY MOUTH EVERY DAY   augmented betamethasone  dipropionate (DIPROLENE ) 0.05 % ointment Apply topically 2 (two) times daily.   Blood Glucose Monitoring Suppl (ACCU-CHEK GUIDE ME) w/Device KIT Use to check blood sugars twice a day.    E11.65   celecoxib  (CELEBREX ) 200 MG capsule TAKE 1 CAPSULE BY MOUTH TWICE A DAY   Dulaglutide  (TRULICITY ) 1.5 MG/0.5ML SOAJ Inject 1.5 mg into the skin once a week.   ergocalciferol  (VITAMIN D2) 1.25 MG (50000 UT) capsule Take 1 capsule (50,000 Units total) by mouth once a week.   esomeprazole  (NEXIUM ) 40 MG capsule TAKE 1 CAPSULE BY MOUTH EVERY DAY FOR STOMACH   fluticasone  (FLONASE ) 50 MCG/ACT nasal spray Place 1-2 sprays into both nostrils daily as needed. For nasal congestion.   gentamicin  cream (GARAMYCIN ) 0.1 % Apply 1 application topically 2 (two) times daily.   glucose blood (ACCU-CHEK GUIDE) test strip Use as instructed  to check blood sugars twice a day  E11.65   hydrALAZINE  (APRESOLINE ) 25 MG tablet TAKE 1 TABLET BY MOUTH THREE TIMES A DAY   ipratropium-albuterol  (DUONEB) 0.5-2.5 (3) MG/3ML SOLN One vial tid prn for asthma, faillure to MDI   levothyroxine  (SYNTHROID ) 88 MCG tablet TAKE 1 TABLET BY MOUTH EVERY MORNING ON EMPTY STOMACH**OK TO CHANGE TO LANNETT PER MD**   linaclotide  (LINZESS ) 72 MCG capsule Take one tab po every day for constipation   loratadine  (CLARITIN ) 10 MG tablet TAKE 1 TABLET BY MOUTH EVERY DAY   losartan -hydrochlorothiazide  (HYZAAR) 100-25 MG tablet Take 1 tablet by mouth daily.   meclizine  (ANTIVERT ) 25 MG tablet Take 1 tablet (25 mg total) by mouth 2 (two) times daily as needed for dizziness.   montelukast  (SINGULAIR ) 10 MG tablet Take 1 tablet (10 mg total) by mouth at bedtime. For sinus congestion and allergies   neomycin -polymyxin-hydrocortisone  (CORTISPORIN) OTIC solution 3-4 drops in both ears bid prn   pneumococcal 20-valent conjugate vaccine (PREVNAR 20) 0.5 ML injection Inject 0.5 mLs into the muscle once as needed for up to 1 dose for immunization.   Potassium 99 MG TABS Take 99 mg by mouth daily at 12 noon.   RSV vaccine recomb adjuvanted (AREXVY ) 120 MCG/0.5ML injection Inject 0.5 mLs into the muscle once as needed for up to 1 dose (RSV vaccination).   torsemide  (DEMADEX ) 20 MG tablet Take 1 tablet (20 mg total) by mouth daily.  umeclidinium-vilanterol (ANORO ELLIPTA ) 62.5-25 MCG/ACT AEPB Inhale 1 puff into the lungs daily at 6 (six) AM.   vitamin B-12 (CYANOCOBALAMIN) 1000 MCG tablet Take 1,000 mcg by mouth daily at 12 noon.   Zoster Vaccine Adjuvanted [facility]) injection Inject 0.5 mLs into the muscle once as needed for up to 1 dose (shingles vaccination).   [DISCONTINUED] Continuous Blood Gluc Receiver (FREESTYLE LIBRE 14 DAY READER) DEVI 1 each by Does not apply route every 14 (fourteen) days. Use every 14 days to monitor blood sugars   E11.65   [DISCONTINUED] Continuous  Blood Gluc Sensor (FREESTYLE LIBRE 14 DAY SENSOR) MISC 1 each by Does not apply route every 14 (fourteen) days. Use every 14 days to monitor blood sugars   E11.65   No facility-administered encounter medications on file as of 12/04/2023.    Surgical History: Past Surgical History:  Procedure Laterality Date   CHOLECYSTECTOMY     HERNIA REPAIR     HERNIA REPAIR     HYSTEROSCOPY WITH D & C N/A 03/19/2015   Procedure: DILATATION AND CURETTAGE /HYSTEROSCOPY and polypectomy;  Surgeon: Mitzie BROCKS Ward, MD;  Location: [facility] ORS;  Service: Gynecology;  Laterality: N/A;   LAPAROSCOPIC HYSTERECTOMY N/A 06/08/2016   Procedure: HYSTERECTOMY TOTAL LAPAROSCOPIC;  Surgeon: Lamar SHAUNNA Lesches, MD;  Location: [facility];  Service: Gynecology;  Laterality: N/A;   LAPAROSCOPIC SALPINGO OOPHERECTOMY Bilateral 06/08/2016   Procedure: LAPAROSCOPIC SALPINGO OOPHORECTOMY;  Surgeon: Lamar SHAUNNA Lesches, MD;  Location: [facility];  Service: Gynecology;  Laterality: Bilateral;   LAPAROTOMY N/A 06/08/2016   Procedure: LAPAROTOMY;  Surgeon: Lamar SHAUNNA Lesches, MD;  Location: [facility];  Service: Gynecology;  Laterality: N/A;    Medical History: Past Medical History:  Diagnosis Date   Asthma    Cardiomyopathy (HCC)    Carpal tunnel syndrome    Carpal tunnel syndrome    Dizziness    Edema    Elevated lipids    GERD (gastroesophageal reflux disease)    Hypertension    Lower extremity edema    Seasonal allergies    Sleep apnea    Wheezing     Family History: Family History  Problem Relation Age of Onset   Diabetes Brother    Hypertension Brother    Breast cancer Neg Hx     Social History   Socioeconomic History   Marital status: Married    Spouse name: Not on file   Number of children: Not on file   Years of education: Not on file   Highest education level: Not on file  Occupational History   Not on file  Tobacco Use   Smoking status: Never   Smokeless tobacco: Current    Types: Chew  Substance and Sexual  Activity   Alcohol use: No   Drug use: No   Sexual activity: Never  Other Topics Concern   Not on file  Social History Narrative   ** Merged History Encounter **       Social Drivers of Corporate investment banker Strain: Not on file  Food Insecurity: Not on file  Transportation Needs: Not on file  Physical Activity: Not on file  Stress: Not on file  Social Connections: Not on file  Intimate Partner Violence: Not on file      Review of Systems  Constitutional:  Negative for fatigue and fever.  HENT:  Negative for congestion, mouth sores and postnasal drip.   Respiratory:  Negative for cough.   Cardiovascular:  Negative for chest pain.  Genitourinary:  Negative for flank pain.  Musculoskeletal:  Positive for arthralgias, gait problem and joint swelling.  Psychiatric/Behavioral: Negative.      Vital Signs: BP 110/68   Pulse 66   Temp 97.8 F (36.6 C)   Resp 16   Ht 5' 3 (1.6 m)   Wt 257 lb (116.6 kg)   SpO2 97%   BMI 45.53 kg/m    Physical Exam Constitutional:      Appearance: Normal appearance.  HENT:     Head: Normocephalic and atraumatic.     Nose: Nose normal.     Mouth/Throat:     Mouth: Mucous membranes are moist.     Pharynx: No posterior oropharyngeal erythema.  Eyes:     Extraocular Movements: Extraocular movements intact.     Pupils: Pupils are equal, round, and reactive to light.  Cardiovascular:     Pulses: Normal pulses.     Heart sounds: Normal heart sounds.  Pulmonary:     Effort: Pulmonary effort is normal.     Breath sounds: Normal breath sounds.  Musculoskeletal:     Right lower leg: Edema present.     Left lower leg: Edema present.  Neurological:     General: No focal deficit present.     Mental Status: She is alert.  Psychiatric:        Mood and Affect: Mood normal.        Behavior: Behavior normal.      Assessment/Plan: 1. Chronic pain of both knees (Primary) Severe OA - AMB referral to orthopedics  2. Moderate  persistent asthma with acute exacerbation Continue MDI as before, will get benefit from cardiopulmonary rehab  - Pulmonary function test; Future  3. OSA on CPAP - CPAP is not working, might need CPAP TITRATION, has OSA   4. Class 3 severe obesity with serious comorbidity and body mass index (BMI) of 40.0 to 44.9 in adult, unspecified obesity type (HCC) Pt has mobility problem, Pt is on Ozempic , might want to change it to zepbound    5. Controlled diabetes mellitus with hyperglycemia (HCC) Controlled    General Counseling: Allycia verbalizes understanding of the findings of todays visit and agrees with plan of treatment. I have discussed any further diagnostic evaluation that may be needed or ordered today. We also reviewed her medications today. she has been encouraged to call the office with any questions or concerns that should arise related to todays visit.    Orders Placed This Encounter  Procedures   AMB referral to orthopedics   Pulmonary function test    No orders of the defined types were placed in this encounter.   Total time spent:35 Minutes Time spent includes review of chart, medications, test results, and follow up plan with the patient.   [place] Controlled Substance Database was reviewed by me.   Dr Chaynce Schafer M Hailynn Slovacek Internal medicine

## 2023-12-05 ENCOUNTER — Telehealth: Payer: Self-pay | Admitting: Internal Medicine

## 2023-12-05 NOTE — Telephone Encounter (Signed)
 Awaiting 12/04/23 office notes for Orthopedic referral-Toni

## 2023-12-07 ENCOUNTER — Telehealth: Payer: Self-pay | Admitting: Internal Medicine

## 2023-12-07 NOTE — Telephone Encounter (Signed)
 Orthopedic referral sent via [facility] to [facility]. Lvm notifying son. Gave telephone # [phone]

## 2023-12-11 ENCOUNTER — Encounter: Payer: Self-pay | Admitting: Internal Medicine

## 2023-12-11 ENCOUNTER — Telehealth: Payer: Self-pay | Admitting: Internal Medicine

## 2023-12-11 NOTE — Addendum Note (Signed)
 Addended by: FERNAND SIGRID HERO on: 12/11/2023 11:35 AM   Modules accepted: Orders

## 2023-12-11 NOTE — Telephone Encounter (Signed)
 Cpap titration order emailed to [facility]-Toni

## 2023-12-13 ENCOUNTER — Telehealth: Payer: Self-pay | Admitting: Internal Medicine

## 2023-12-13 NOTE — Telephone Encounter (Signed)
 Sleep study results emailed to [facility], attention Cindy T for titration-Toni

## 2023-12-14 ENCOUNTER — Telehealth: Payer: Self-pay | Admitting: Internal Medicine

## 2023-12-14 NOTE — Telephone Encounter (Signed)
 Orthopedic appointment 12/18/2023 with [facility]-Toni

## 2023-12-21 ENCOUNTER — Encounter: Payer: Self-pay | Admitting: Cardiovascular Disease

## 2023-12-21 ENCOUNTER — Ambulatory Visit: Attending: Cardiovascular Disease | Admitting: Cardiovascular Disease

## 2023-12-21 VITALS — BP 112/58 | HR 63 | Ht 65.0 in | Wt 262.5 lb

## 2023-12-21 DIAGNOSIS — E785 Hyperlipidemia, unspecified: Secondary | ICD-10-CM | POA: Insufficient documentation

## 2023-12-21 DIAGNOSIS — I2089 Other forms of angina pectoris: Secondary | ICD-10-CM | POA: Insufficient documentation

## 2023-12-21 DIAGNOSIS — I10 Essential (primary) hypertension: Secondary | ICD-10-CM | POA: Insufficient documentation

## 2023-12-21 DIAGNOSIS — E783 Hyperchylomicronemia: Secondary | ICD-10-CM | POA: Diagnosis present

## 2023-12-21 DIAGNOSIS — R0609 Other forms of dyspnea: Secondary | ICD-10-CM | POA: Insufficient documentation

## 2023-12-21 NOTE — Patient Instructions (Addendum)
 Medication Instructions:  No changes *If you need a refill on your cardiac medications before your next appointment, please call your pharmacy*  Lab Work: None ordered If you have labs (blood work) drawn today and your tests are completely normal, you will receive your results only by: MyChart Message (if you have MyChart) OR A paper copy in the mail If you have any lab test that is abnormal or we need to change your treatment, we will call you to review the results.  Testing/Procedures: CARDIAC PET- Your physician has requested that you have a Cardiac Pet Stress Test.   This testing is completed at [facility] ([address] [place], [place] [address]) or [facility] ([address], [place], [place]). Please arrive 30 minutes prior to your scheduled time.  The schedulers will call you to get this scheduled. Please follow further testing instructions below.  Your physician has requested that you have an echocardiogram. Echocardiography is a painless test that uses sound waves to create images of your heart. It provides your doctor with information about the size and shape of your heart and how well your heart's chambers and valves are working.   You may receive an ultrasound enhancing agent through an IV if needed to better visualize your heart during the echo. This procedure takes approximately one hour.  There are no restrictions for this procedure.  This will take place at [address] [facility]) [address], [place] [address]  Please note: We ask at that you not bring children with you during ultrasound (echo/ vascular) testing. Due to room size and safety concerns, children are not allowed in the ultrasound rooms during exams. Our front office staff cannot provide observation of children in our lobby area while testing is being conducted. An adult accompanying a patient to their appointment will only be allowed in the  ultrasound room at the discretion of the ultrasound technician under special circumstances. We apologize for any inconvenience.   Follow-Up: At [facility], you and your health needs are our priority.  As part of our continuing mission to provide you with exceptional heart care, our providers are all part of one team.  This team includes your primary Cardiologist (physician) and Advanced Practice Providers or APPs (Physician Assistants and Nurse Practitioners) who all work together to provide you with the care you need, when you need it.  Your next appointment:   3 month(s)  Provider:   Deatrice Cage, MD    We recommend signing up for the patient portal called MyChart.  Sign up information is provided on this After Visit Summary.  MyChart is used to connect with patients for Virtual Visits (Telemedicine).  Patients are able to view lab/test results, encounter notes, upcoming appointments, etc.  Non-urgent messages can be sent to your provider as well.   To learn more about what you can do with MyChart, go to [email or web address].   Other Instructions    Please report to Radiology at the [facility] Main Entrance 30 minutes early for your test.  [address] [place], [place] [address]                         OR   Please report to Radiology at [facility] Main Entrance, medical mall, 30 mins prior to your test.  [address]  [place], [place]  How to Prepare for Your Cardiac PET/CT Stress Test:  Nothing to eat or drink, except water, 3 hours prior to arrival time.  NO caffeine/decaffeinated products, or chocolate 12 hours prior to arrival. (Please note decaffeinated beverages (teas/coffees) still contain caffeine).  If you have caffeine within 12 hours prior, the test will need to be rescheduled.  Medication instructions: Nothing to hold  Diabetic Preparation: If able to eat breakfast prior to 3 hour fasting, you may take  all medications, including your insulin. Do not worry if you miss your breakfast dose of insulin - start at your next meal. If you do not eat prior to 3 hour fast-Hold all diabetes (oral and insulin) medications. Patients who wear a continuous glucose monitor MUST remove the device prior to scanning.  You may take your remaining medications with water.  NO perfume, cologne or lotion on chest or abdomen area. FEMALES - Please avoid wearing dresses to this appointment.  Total time is 1 to 2 hours; you may want to bring reading material for the waiting time.  IF YOU THINK YOU MAY BE PREGNANT, OR ARE NURSING PLEASE INFORM THE TECHNOLOGIST.  In preparation for your appointment, medication and supplies will be purchased.  Appointment availability is limited, so if you need to cancel or reschedule, please call the Radiology Department Scheduler at [phone] 24 hours in advance to avoid a cancellation fee of $100.00  What to Expect When you Arrive:  Once you arrive and check in for your appointment, you will be taken to a preparation room within the Radiology Department.  A technologist or Nurse will obtain your medical history, verify that you are correctly prepped for the exam, and explain the procedure.  Afterwards, an IV will be started in your arm and electrodes will be placed on your skin for EKG monitoring during the stress portion of the exam. Then you will be escorted to the PET/CT scanner.  There, staff will get you positioned on the scanner and obtain a blood pressure and EKG.  During the exam, you will continue to be connected to the EKG and blood pressure machines.  A small, safe amount of a radioactive tracer will be injected in your IV to obtain a series of pictures of your heart along with an injection of a stress agent.    After your Exam:  It is recommended that you eat a meal and drink a caffeinated beverage to counter act any effects of the stress agent.  Drink plenty of fluids  for the remainder of the day and urinate frequently for the first couple of hours after the exam.  Your doctor will inform you of your test results within 7-10 business days.  For more information and frequently asked questions, please visit our website: [email or web address]  For questions about your test or how to prepare for your test, please call: Cardiac Imaging Nurse Navigators Office: [phone]

## 2023-12-21 NOTE — Progress Notes (Signed)
 Cardiology Office Note   Date:  12/21/2023   ID:  Anelle Brenton, DOB 12/02/1950, MRN [id number]  PCP:  Fernand Sigrid HERO, MD  Cardiologist:   Deatrice Cage, MD   Chief Complaint  Patient presents with   New Patient (Initial Visit)    HLD/HTN/DM c/o bilateral arm pain. Meds reviewed verbally with pt.      History of Present Illness: Brianna Gray is a 73 y.o. female who was referred by Dr. Fernand for evaluation of shortness of breath and possible heart failure.  She has known history of diet-controlled type 2 diabetes, essential hypertension, hyperlipidemia, obesity with a BMI of 43, mild asthma and thyroid disease. She has no chest pain but has been having progressive dyspnea with minimal exertion which has been attributed to physical deconditioning.  Her physical activities are limited by significant arthritis.  She has bilateral carpal tunnel syndrome.  She recently had lower extremity edema but reports improvement with hydrochlorothiazide .  She does not exercise on a regular basis. She is not a smoker.   Past Medical History:  Diagnosis Date   Asthma    Cardiomyopathy (HCC)    Carpal tunnel syndrome    Carpal tunnel syndrome    Diabetes mellitus without complication (HCC)    Dizziness    Edema    Elevated lipids    GERD (gastroesophageal reflux disease)    Hyperlipidemia    Hypertension    Lower extremity edema    Seasonal allergies    Sleep apnea    Thyroid disease    Wheezing     Past Surgical History:  Procedure Laterality Date   CHOLECYSTECTOMY     HERNIA REPAIR     HERNIA REPAIR     HYSTEROSCOPY WITH D & C N/A 03/19/2015   Procedure: DILATATION AND CURETTAGE /HYSTEROSCOPY and polypectomy;  Surgeon: Mitzie BROCKS Ward, MD;  Location: [facility] ORS;  Service: Gynecology;  Laterality: N/A;   LAPAROSCOPIC HYSTERECTOMY N/A 06/08/2016   Procedure: HYSTERECTOMY TOTAL LAPAROSCOPIC;  Surgeon: Lamar SHAUNNA Lesches, MD;  Location: [facility] ORS;  Service: Gynecology;  Laterality: N/A;    LAPAROSCOPIC SALPINGO OOPHERECTOMY Bilateral 06/08/2016   Procedure: LAPAROSCOPIC SALPINGO OOPHORECTOMY;  Surgeon: Lamar SHAUNNA Lesches, MD;  Location: [facility] ORS;  Service: Gynecology;  Laterality: Bilateral;   LAPAROTOMY N/A 06/08/2016   Procedure: LAPAROTOMY;  Surgeon: Lamar SHAUNNA Lesches, MD;  Location: [facility] ORS;  Service: Gynecology;  Laterality: N/A;     Current Outpatient Medications  Medication Sig Dispense Refill   Accu-Chek Softclix Lancets lancets Use to check blood sugars twice a day.  E11.65 100 each 1   acetic acid -hydrocortisone  (VOSOL -HC) OTIC solution Place 3 drops into both ears 2 (two) times daily as needed (itchy dry ears). 10 mL 0   ADVAIR  DISKUS 250-50 MCG/ACT AEPB INHALE 1 PUFF INTO THE LUNGS 2 (TWO) TIMES DAILY AS NEEDED. FOR RESPIRATORY ISSUES. 60 each 12   amLODipine (NORVASC) 5 MG tablet Take 5 mg by mouth daily.     atorvastatin  (LIPITOR) 10 MG tablet TAKE 1 TABLET BY MOUTH EVERY DAY (Patient taking differently: Take 10 mg by mouth every other day.) 90 tablet 3   augmented betamethasone  dipropionate (DIPROLENE ) 0.05 % ointment Apply topically 2 (two) times daily. 30 g 0   Blood Glucose Monitoring Suppl (ACCU-CHEK GUIDE ME) w/Device KIT Use to check blood sugars twice a day.    E11.65 1 kit 0   celecoxib  (CELEBREX ) 200 MG capsule TAKE 1 CAPSULE BY MOUTH TWICE A DAY 60 capsule 3  Dulaglutide  (TRULICITY ) 1.5 MG/0.5ML SOAJ Inject 1.5 mg into the skin once a week. 6 mL 1   ergocalciferol  (VITAMIN D2) 1.25 MG (50000 UT) capsule Take 1 capsule (50,000 Units total) by mouth once a week. 4 capsule 5   esomeprazole  (NEXIUM ) 40 MG capsule TAKE 1 CAPSULE BY MOUTH EVERY DAY FOR STOMACH 90 capsule 3   fluticasone  (FLONASE ) 50 MCG/ACT nasal spray Place 1-2 sprays into both nostrils daily as needed. For nasal congestion. 1 g 2   gabapentin (NEURONTIN) 300 MG capsule Take 300 mg by mouth at bedtime.     gentamicin  cream (GARAMYCIN ) 0.1 % Apply 1 application topically 2 (two) times daily. 30 g 1    glucose blood (ACCU-CHEK GUIDE) test strip Use as instructed to check blood sugars twice a day  E11.65 100 each 1   hydrALAZINE  (APRESOLINE ) 25 MG tablet TAKE 1 TABLET BY MOUTH THREE TIMES A DAY 270 tablet 1   levothyroxine  (SYNTHROID ) 88 MCG tablet TAKE 1 TABLET BY MOUTH EVERY MORNING ON EMPTY STOMACH**OK TO CHANGE TO LANNETT PER MD** 90 tablet 4   loratadine  (CLARITIN ) 10 MG tablet TAKE 1 TABLET BY MOUTH EVERY DAY 30 tablet 5   losartan -hydrochlorothiazide  (HYZAAR) 100-25 MG tablet Take 1 tablet by mouth daily. 90 tablet 3   torsemide  (DEMADEX ) 20 MG tablet Take 1 tablet (20 mg total) by mouth daily. (Patient taking differently: Take 20 mg by mouth every other day.) 90 tablet 3   vitamin B-12 (CYANOCOBALAMIN) 1000 MCG tablet Take 1,000 mcg by mouth daily at 12 noon.     ipratropium-albuterol  (DUONEB) 0.5-2.5 (3) MG/3ML SOLN One vial tid prn for asthma, faillure to MDI (Patient not taking: Reported on 12/21/2023) 120 mL 1   linaclotide  (LINZESS ) 72 MCG capsule Take one tab po every day for constipation (Patient not taking: Reported on 12/21/2023) 30 capsule 12   meclizine  (ANTIVERT ) 25 MG tablet Take 1 tablet (25 mg total) by mouth 2 (two) times daily as needed for dizziness. (Patient not taking: Reported on 12/21/2023) 30 tablet 1   montelukast  (SINGULAIR ) 10 MG tablet Take 1 tablet (10 mg total) by mouth at bedtime. For sinus congestion and allergies (Patient not taking: Reported on 12/21/2023) 90 tablet 3   neomycin -polymyxin-hydrocortisone  (CORTISPORIN) OTIC solution 3-4 drops in both ears bid prn (Patient not taking: Reported on 12/21/2023) 10 mL 0   pneumococcal 20-valent conjugate vaccine (PREVNAR 20) 0.5 ML injection Inject 0.5 mLs into the muscle once as needed for up to 1 dose for immunization. (Patient not taking: Reported on 12/21/2023) 0.5 mL 0   Potassium 99 MG TABS Take 99 mg by mouth daily at 12 noon. (Patient not taking: Reported on 12/21/2023)     RSV vaccine recomb adjuvanted  (AREXVY ) 120 MCG/0.5ML injection Inject 0.5 mLs into the muscle once as needed for up to 1 dose (RSV vaccination). (Patient not taking: Reported on 12/21/2023) 0.5 mL 0   umeclidinium-vilanterol (ANORO ELLIPTA ) 62.5-25 MCG/ACT AEPB Inhale 1 puff into the lungs daily at 6 (six) AM. (Patient not taking: Reported on 12/21/2023) 1 each 4   Zoster Vaccine Adjuvanted [facility]) injection Inject 0.5 mLs into the muscle once as needed for up to 1 dose (shingles vaccination). (Patient not taking: Reported on 12/21/2023) 0.5 mL 1   No current facility-administered medications for this visit.    Allergies:   Patient has no known allergies.    Social History:  The patient  reports that she has never smoked. Her smokeless tobacco use includes chew. She  reports that she does not drink alcohol and does not use drugs.   Family History:  The patient's family history includes Diabetes in her brother; Heart Problems in her mother; Hypertension in her brother.    ROS:  Please see the history of present illness.   Otherwise, review of systems are positive for none.   All other systems are reviewed and negative.    PHYSICAL EXAM: VS:  BP (!) 112/58 (BP Location: Right Arm, Cuff Size: Large)   Pulse 63   Ht 5' 5 (1.651 m)   Wt 262 lb 8 oz (119.1 kg)   SpO2 97%   BMI 43.68 kg/m  , BMI Body mass index is 43.68 kg/m. GEN: Well nourished, well developed, in no acute distress  HEENT: normal  Neck: no JVD, carotid bruits, or masses Cardiac: RRR; no murmurs, rubs, or gallops,no edema  Respiratory:  clear to auscultation bilaterally, normal work of breathing GI: soft, nontender, nondistended, + BS MS: no deformity or atrophy  Skin: warm and dry, no rash Neuro:  Strength and sensation are intact Psych: euthymic mood, full affect   EKG:  EKG is ordered today. The ekg ordered today demonstrates : Sinus rhythm with 1st degree A-V block with occasional Premature ventricular complexes Nonspecific ST and T wave  abnormality When compared with ECG of 30-Jul-2019 13:48, Premature ventricular complexes are now Present PR interval has increased ST elevation now present in Inferior leads QT has lengthened    Recent Labs: 11/09/2023: ALT 21; BUN 23; Creatinine, Ser 1.09; Hemoglobin 11.4; Platelets 296; Potassium 3.8; Sodium 137; TSH 2.390    Lipid Panel    Component Value Date/Time   CHOL 161 11/09/2023 0944   TRIG 161 (H) 11/09/2023 0944   HDL 48 11/09/2023 0944   CHOLHDL 3.5 10/10/2022 1056   LDLCALC 85 11/09/2023 0944      Wt Readings from Last 3 Encounters:  12/21/23 262 lb 8 oz (119.1 kg)  12/04/23 257 lb (116.6 kg)  11/06/23 259 lb (117.5 kg)          12/21/2023    9:00 AM  PAD Screen  Previous PAD dx? No  Previous surgical procedure? No  Pain with walking? No  Feet/toe relief with dangling? Yes  Painful, non-healing ulcers? No  Extremities discolored? No      ASSESSMENT AND PLAN:  1.  Exertional dyspnea possible anginal equivalent given her risk factors and abnormal EKG which shows minor inferior ST elevation and PVCs.  I recommend evaluation with a cardiac PET scan.  She is not able to exercise on a treadmill and this will give better imaging given her BMI of 43. Will also obtain an echocardiogram to evaluate for heart failure.  Given recent concerns about heart failure symptoms and by bilateral carpal tunnel syndrome, we will look for any suggestion of amyloidosis.  2.  Essential hypertension: Blood pressure is controlled on current medications.  3.  Hyperlipidemia: Currently on atorvastatin  10 mg every other day.  Most recent lipid profile showed an LDL of 85.  Given that she is diabetic, recommended target LDL of less than 70 and should consider making atorvastatin  daily.    Disposition:   FU with me in 3 months  Signed,  Deatrice Cage, MD  12/21/2023 9:16 AM    [facility]

## 2023-12-26 ENCOUNTER — Ambulatory Visit (INDEPENDENT_AMBULATORY_CARE_PROVIDER_SITE_OTHER): Admitting: Internal Medicine

## 2023-12-26 DIAGNOSIS — J4541 Moderate persistent asthma with (acute) exacerbation: Secondary | ICD-10-CM

## 2024-01-02 ENCOUNTER — Telehealth (HOSPITAL_COMMUNITY): Payer: Self-pay | Admitting: *Deleted

## 2024-01-02 ENCOUNTER — Encounter (HOSPITAL_COMMUNITY): Payer: Self-pay

## 2024-01-02 NOTE — Telephone Encounter (Signed)
 Attempted to call patient regarding upcoming cardiac PET appointment. Left message on voicemail with name and callback number  Larey Brick RN Navigator Cardiac Imaging [facility] [phone] Office [phone] Cell

## 2024-01-02 NOTE — Telephone Encounter (Signed)
 Reaching out to patient to offer assistance regarding upcoming cardiac imaging study; pt's son verbalizes understanding of appt date/time, parking situation and where to check in, pre-test NPO status, and verified current allergies; name and call back number provided for further questions should they arise  Chantal Requena RN Navigator Cardiac Imaging [facility] [phone] office [phone] cell  Patient's son is aware she is to avoid caffeine 12 hours prior to her cardiac PET study.

## 2024-01-03 ENCOUNTER — Ambulatory Visit
Admission: RE | Admit: 2024-01-03 | Discharge: 2024-01-03 | Disposition: A | Discharge status: Home / Self Care | Source: Ambulatory Visit | Attending: Cardiovascular Disease | Admitting: Cardiovascular Disease

## 2024-01-03 DIAGNOSIS — I700 Atherosclerosis of aorta: Secondary | ICD-10-CM | POA: Diagnosis not present

## 2024-01-03 DIAGNOSIS — R0609 Other forms of dyspnea: Secondary | ICD-10-CM | POA: Diagnosis present

## 2024-01-03 DIAGNOSIS — K760 Fatty (change of) liver, not elsewhere classified: Secondary | ICD-10-CM | POA: Diagnosis not present

## 2024-01-03 DIAGNOSIS — I2510 Atherosclerotic heart disease of native coronary artery without angina pectoris: Secondary | ICD-10-CM | POA: Diagnosis not present

## 2024-01-03 MED ORDER — RUBIDIUM RB82 GENERATOR (RUBYFILL)
25.0000 | PACK | Freq: Once | INTRAVENOUS | Status: AC
  Administered 2024-01-03: 24.93 via INTRAVENOUS

## 2024-01-03 MED ORDER — REGADENOSON 0.4 MG/5ML IV SOLN
0.4000 mg | Freq: Once | INTRAVENOUS | Status: AC
  Administered 2024-01-03: 0.4 mg via INTRAVENOUS
  Filled 2024-01-03: qty 5

## 2024-01-03 MED ORDER — RUBIDIUM RB82 GENERATOR (RUBYFILL)
25.0000 | PACK | Freq: Once | INTRAVENOUS | Status: AC
  Administered 2024-01-03: 24.94 via INTRAVENOUS

## 2024-01-03 MED ORDER — REGADENOSON 0.4 MG/5ML IV SOLN
INTRAVENOUS | Status: AC
  Filled 2024-01-03: qty 5

## 2024-01-05 LAB — NM PET CT CARDIAC PERFUSION MULTI W/ABSOLUTE BLOODFLOW
MBFR: 1.55
Nuc Rest EF: 68 %
Nuc Stress EF: 75 %
Peak HR: 81 {beats}/min
Rest HR: 63 {beats}/min
Rest MBF: 0.8 ml/g/min
Rest Nuclear Isotope Dose: 24.9 mCi
SRS: 0
SSS: 2
ST Depression (mm): 0 mm
Stress MBF: 1.24 ml/g/min
Stress Nuclear Isotope Dose: 24.9 mCi
TID: 1.09

## 2024-01-07 ENCOUNTER — Encounter: Payer: Self-pay | Admitting: Cardiovascular Disease

## 2024-01-08 ENCOUNTER — Ambulatory Visit: Admitting: Internal Medicine

## 2024-01-08 ENCOUNTER — Encounter: Payer: Self-pay | Admitting: Internal Medicine

## 2024-01-08 VITALS — BP 112/70 | HR 90 | Temp 98.3°F | Resp 16 | Ht 63.0 in | Wt 258.4 lb

## 2024-01-08 DIAGNOSIS — R0609 Other forms of dyspnea: Secondary | ICD-10-CM | POA: Diagnosis not present

## 2024-01-08 DIAGNOSIS — E66813 Obesity, class 3: Secondary | ICD-10-CM

## 2024-01-08 DIAGNOSIS — Z6841 Body Mass Index (BMI) 40.0 and over, adult: Secondary | ICD-10-CM

## 2024-01-08 DIAGNOSIS — G4733 Obstructive sleep apnea (adult) (pediatric): Secondary | ICD-10-CM

## 2024-01-08 DIAGNOSIS — M25561 Pain in right knee: Secondary | ICD-10-CM | POA: Diagnosis not present

## 2024-01-08 DIAGNOSIS — G8929 Other chronic pain: Secondary | ICD-10-CM

## 2024-01-08 DIAGNOSIS — M25562 Pain in left knee: Secondary | ICD-10-CM

## 2024-01-08 DIAGNOSIS — K7581 Nonalcoholic steatohepatitis (NASH): Secondary | ICD-10-CM

## 2024-01-08 DIAGNOSIS — E782 Mixed hyperlipidemia: Secondary | ICD-10-CM

## 2024-01-08 MED ORDER — ATORVASTATIN CALCIUM 10 MG PO TABS: 10.0000 mg | ORAL_TABLET | Freq: Every day | ORAL | 3 refills | Status: DC

## 2024-01-08 MED ORDER — TIRZEPATIDE 5 MG/0.5ML ~~LOC~~ SOAJ: 5.0000 mg | SUBCUTANEOUS | 1 refills | Status: AC

## 2024-01-08 NOTE — Progress Notes (Signed)
 [facility] [address] [place], [place] [address]  Internal MEDICINE  Office Visit Note  Patient Name: Brianna Gray  [id number]  [id number]  Date of Service: 01/08/2024  Chief Complaint  Patient presents with   Follow-up    Review PFT - Diabetes, HTN   Quality Metric Gaps    Mammogram and TDAP    HPI Pt is seen today for multiple medical problems She has been seen by cardiology, cardiac PET results are pending,  Did she ortho and IA injection was given, pt continues to be in pain, needs to look into knee replacement  CPAP Titration is pending as well Continues to have SOB     Current Medication: Outpatient Encounter Medications as of 01/08/2024  Medication Sig   Accu-Chek Softclix Lancets lancets Use to check blood sugars twice a day.  E11.65   acetic acid -hydrocortisone  (VOSOL -HC) OTIC solution Place 3 drops into both ears 2 (two) times daily as needed (itchy dry ears).   ADVAIR  DISKUS 250-50 MCG/ACT AEPB INHALE 1 PUFF INTO THE LUNGS 2 (TWO) TIMES DAILY AS NEEDED. FOR RESPIRATORY ISSUES.   amLODipine (NORVASC) 5 MG tablet Take 5 mg by mouth daily.   atorvastatin  (LIPITOR) 10 MG tablet Take 1 tablet (10 mg total) by mouth daily.   augmented betamethasone  dipropionate (DIPROLENE ) 0.05 % ointment Apply topically 2 (two) times daily.   Blood Glucose Monitoring Suppl (ACCU-CHEK GUIDE ME) w/Device KIT Use to check blood sugars twice a day.    E11.65   celecoxib  (CELEBREX ) 200 MG capsule TAKE 1 CAPSULE BY MOUTH TWICE A DAY   Cholecalciferol (VITAMIN D -3) 25 MCG (1000 UT) CAPS Take 1,000 capsules by mouth daily.   ergocalciferol  (VITAMIN D2) 1.25 MG (50000 UT) capsule Take 1 capsule (50,000 Units total) by mouth once a week.   esomeprazole  (NEXIUM ) 40 MG capsule TAKE 1 CAPSULE BY MOUTH EVERY DAY FOR STOMACH   fluticasone  (FLONASE ) 50 MCG/ACT nasal spray Place 1-2 sprays into both nostrils daily as needed. For nasal congestion.   gabapentin (NEURONTIN) 300 MG  capsule Take 300 mg by mouth at bedtime.   gentamicin  cream (GARAMYCIN ) 0.1 % Apply 1 application topically 2 (two) times daily.   glucose blood (ACCU-CHEK GUIDE) test strip Use as instructed to check blood sugars twice a day  E11.65   hydrALAZINE  (APRESOLINE ) 25 MG tablet TAKE 1 TABLET BY MOUTH THREE TIMES A DAY   ipratropium-albuterol  (DUONEB) 0.5-2.5 (3) MG/3ML SOLN One vial tid prn for asthma, faillure to MDI   levothyroxine  (SYNTHROID ) 88 MCG tablet TAKE 1 TABLET BY MOUTH EVERY MORNING ON EMPTY STOMACH**OK TO CHANGE TO LANNETT PER MD**   linaclotide  (LINZESS ) 72 MCG capsule Take one tab po every day for constipation   loratadine  (CLARITIN ) 10 MG tablet TAKE 1 TABLET BY MOUTH EVERY DAY   losartan -hydrochlorothiazide  (HYZAAR) 100-25 MG tablet Take 1 tablet by mouth daily.   meclizine  (ANTIVERT ) 25 MG tablet Take 1 tablet (25 mg total) by mouth 2 (two) times daily as needed for dizziness.   montelukast  (SINGULAIR ) 10 MG tablet Take 1 tablet (10 mg total) by mouth at bedtime. For sinus congestion and allergies   neomycin -polymyxin-hydrocortisone  (CORTISPORIN) OTIC solution 3-4 drops in both ears bid prn   Potassium 99 MG TABS Take 99 mg by mouth daily at 12 noon.   tirzepatide (MOUNJARO) 5 MG/0.5ML Pen Inject 5 mg into the skin once a week.   torsemide  (DEMADEX ) 20 MG tablet Take 1 tablet (20 mg total) by mouth daily. (Patient taking  differently: Take 20 mg by mouth every other day.)   umeclidinium-vilanterol (ANORO ELLIPTA ) 62.5-25 MCG/ACT AEPB Inhale 1 puff into the lungs daily at 6 (six) AM.   vitamin B-12 (CYANOCOBALAMIN) 1000 MCG tablet Take 1,000 mcg by mouth daily at 12 noon.   [DISCONTINUED] atorvastatin  (LIPITOR) 10 MG tablet TAKE 1 TABLET BY MOUTH EVERY DAY (Patient taking differently: Take 10 mg by mouth every other day.)   [DISCONTINUED] Dulaglutide  (TRULICITY ) 1.5 MG/0.5ML SOAJ Inject 1.5 mg into the skin once a week.   [DISCONTINUED] pneumococcal 20-valent conjugate vaccine (PREVNAR  20) 0.5 ML injection Inject 0.5 mLs into the muscle once as needed for up to 1 dose for immunization.   [DISCONTINUED] RSV vaccine recomb adjuvanted (AREXVY ) 120 MCG/0.5ML injection Inject 0.5 mLs into the muscle once as needed for up to 1 dose (RSV vaccination).   [DISCONTINUED] Zoster Vaccine Adjuvanted [facility]) injection Inject 0.5 mLs into the muscle once as needed for up to 1 dose (shingles vaccination).   No facility-administered encounter medications on file as of 01/08/2024.    Surgical History: Past Surgical History:  Procedure Laterality Date   CHOLECYSTECTOMY     HERNIA REPAIR     HERNIA REPAIR     HYSTEROSCOPY WITH D & C N/A 03/19/2015   Procedure: DILATATION AND CURETTAGE /HYSTEROSCOPY and polypectomy;  Surgeon: Mitzie BROCKS Ward, MD;  Location: [facility] ORS;  Service: Gynecology;  Laterality: N/A;   LAPAROSCOPIC HYSTERECTOMY N/A 06/08/2016   Procedure: HYSTERECTOMY TOTAL LAPAROSCOPIC;  Surgeon: Lamar SHAUNNA Lesches, MD;  Location: [facility] ORS;  Service: Gynecology;  Laterality: N/A;   LAPAROSCOPIC SALPINGO OOPHERECTOMY Bilateral 06/08/2016   Procedure: LAPAROSCOPIC SALPINGO OOPHORECTOMY;  Surgeon: Lamar SHAUNNA Lesches, MD;  Location: [facility] ORS;  Service: Gynecology;  Laterality: Bilateral;   LAPAROTOMY N/A 06/08/2016   Procedure: LAPAROTOMY;  Surgeon: Lamar SHAUNNA Lesches, MD;  Location: [facility] ORS;  Service: Gynecology;  Laterality: N/A;    Medical History: Past Medical History:  Diagnosis Date   Asthma    Cardiomyopathy (HCC)    Carpal tunnel syndrome    Carpal tunnel syndrome    Diabetes mellitus without complication (HCC)    Dizziness    Edema    Elevated lipids    GERD (gastroesophageal reflux disease)    Hyperlipidemia    Hypertension    Lower extremity edema    Seasonal allergies    Sleep apnea    Thyroid disease    Wheezing     Family History: Family History  Problem Relation Age of Onset   Heart Problems Mother    Diabetes Brother    Hypertension Brother    Breast cancer  Neg Hx     Social History   Socioeconomic History   Marital status: Married    Spouse name: Not on file   Number of children: Not on file   Years of education: Not on file   Highest education level: Not on file  Occupational History   Not on file  Tobacco Use   Smoking status: Never   Smokeless tobacco: Current    Types: Chew  Vaping Use   Vaping status: Never Used  Substance and Sexual Activity   Alcohol use: No   Drug use: No   Sexual activity: Never  Other Topics Concern   Not on file  Social History Narrative   ** Merged History Encounter **       Social Drivers of Corporate Investment Banker Strain: Not on file  Food Insecurity: Not on file  Transportation Needs: Not on file  Physical Activity: Not on file  Stress: Not on file  Social Connections: Not on file  Intimate Partner Violence: Not on file      Review of Systems  Constitutional:  Negative for chills, fatigue and unexpected weight change.  HENT:  Positive for postnasal drip. Negative for congestion, rhinorrhea, sneezing and sore throat.   Eyes:  Negative for redness.  Respiratory:  Positive for shortness of breath. Negative for cough and chest tightness.   Cardiovascular:  Negative for chest pain and palpitations.  Gastrointestinal:  Negative for abdominal pain, constipation, diarrhea, nausea and vomiting.  Genitourinary:  Negative for dysuria and frequency.  Musculoskeletal:  Positive for arthralgias and gait problem. Negative for back pain, joint swelling and neck pain.  Skin:  Negative for rash.  Neurological:  Negative for tremors and numbness.  Hematological:  Negative for adenopathy. Does not bruise/bleed easily.  Psychiatric/Behavioral:  Negative for behavioral problems (Depression), sleep disturbance and suicidal ideas. The patient is not nervous/anxious.     Vital Signs: BP 112/70   Pulse 90   Temp 98.3 F (36.8 C)   Resp 16   Ht 5' 3 (1.6 m)   Wt 258 lb 6.4 oz (117.2 kg)   SpO2  98%   BMI 45.77 kg/m    Physical Exam Constitutional:      Appearance: Normal appearance.  HENT:     Head: Normocephalic and atraumatic.     Nose: Nose normal.     Mouth/Throat:     Mouth: Mucous membranes are moist.     Pharynx: No posterior oropharyngeal erythema.  Eyes:     Extraocular Movements: Extraocular movements intact.     Pupils: Pupils are equal, round, and reactive to light.  Cardiovascular:     Pulses: Normal pulses.     Heart sounds: Normal heart sounds.  Pulmonary:     Effort: Pulmonary effort is normal.     Breath sounds: Normal breath sounds.  Neurological:     General: No focal deficit present.     Mental Status: She is alert.  Psychiatric:        Mood and Affect: Mood normal.        Behavior: Behavior normal.        Assessment/Plan: 1. Dyspnea on exertion (Primary) Awaiting cardiac PET scan results, seen by cardiology   2. OSA on CPAP Will get CPAP Retitration to get new CPAP machine  - tirzepatide (MOUNJARO) 5 MG/0.5ML Pen; Inject 5 mg into the skin once a week.  Dispense: 6 mL; Refill: 1  3. Chronic pain of both knees Seen by ortho, might need knee replacement   4. Class 3 severe obesity with serious comorbidity and body mass index (BMI) of 40.0 to 44.9 in adult, unspecified obesity type (HCC) Encourage wt loss, pt is restricted in her mobility due to pain  - tirzepatide (MOUNJARO) 5 MG/0.5ML Pen; Inject 5 mg into the skin once a week.  Dispense: 6 mL; Refill: 1  5. Metabolic dysfunction-associated steatohepatitis (MASH) Will DC trulicity  and start on Mounjaro  - tirzepatide (MOUNJARO) 5 MG/0.5ML Pen; Inject 5 mg into the skin once a week.  Dispense: 6 mL; Refill: 1  6. Mixed hyperlipidemia Increase dose by cardiology  - atorvastatin  (LIPITOR) 10 MG tablet; Take 1 tablet (10 mg total) by mouth daily.  Dispense: 90 tablet; Refill: 3   General Counseling: Zayah verbalizes understanding of the findings of todays visit and agrees with plan  of treatment. I  have discussed any further diagnostic evaluation that may be needed or ordered today. We also reviewed her medications today. she has been encouraged to call the office with any questions or concerns that should arise related to todays visit.    No orders of the defined types were placed in this encounter.   Meds ordered this encounter  Medications   atorvastatin  (LIPITOR) 10 MG tablet    Sig: Take 1 tablet (10 mg total) by mouth daily.    Dispense:  90 tablet    Refill:  3   tirzepatide (MOUNJARO) 5 MG/0.5ML Pen    Sig: Inject 5 mg into the skin once a week.    Dispense:  6 mL    Refill:  1    Total time spent:35 Minutes Time spent includes review of chart, medications, test results, and follow up plan with the patient.   [place] Controlled Substance Database was reviewed by me.   Dr Jimmi Sidener M Danaysha Kirn Internal medicine

## 2024-01-15 ENCOUNTER — Telehealth: Payer: Self-pay

## 2024-01-15 NOTE — Procedures (Signed)
 [facility] [address] [place], [address]    Complete Pulmonary Function Testing Interpretation:  FINDINGS:  Forced vital capacity is mildly decreased.  FEV1 is within normal limits.  FEV1 FVC ratio was normal.  Total lung capacity was moderately decreased.  Residual volume is decreased.  FRC is decreased.  DLCO was mildly decreased however was normal at 1 compared to alveolar volume.  Postbronchodilator there is no significant change in FEV1.  IMPRESSION:  This pulmonary function study is consistent with moderate restrictive lung disease clinical correlation is recommended.  Brianna DELENA Bathe, MD [facility] Pulmonary Critical Care Medicine Sleep Medicine

## 2024-01-15 NOTE — Telephone Encounter (Signed)
 Faxed patient's P.A. form to [employer] for Mounjaro.

## 2024-01-16 LAB — PULMONARY FUNCTION TEST

## 2024-01-18 ENCOUNTER — Ambulatory Visit: Payer: Self-pay | Admitting: Cardiovascular Disease

## 2024-01-18 ENCOUNTER — Other Ambulatory Visit: Payer: Self-pay | Admitting: Internal Medicine

## 2024-01-18 DIAGNOSIS — G4733 Obstructive sleep apnea (adult) (pediatric): Secondary | ICD-10-CM

## 2024-01-18 DIAGNOSIS — K7581 Nonalcoholic steatohepatitis (NASH): Secondary | ICD-10-CM

## 2024-01-18 DIAGNOSIS — Z6841 Body Mass Index (BMI) 40.0 and over, adult: Secondary | ICD-10-CM

## 2024-01-22 ENCOUNTER — Telehealth: Payer: Self-pay

## 2024-01-22 NOTE — Telephone Encounter (Signed)
Patient approved for [facility]

## 2024-01-30 ENCOUNTER — Other Ambulatory Visit: Payer: Self-pay | Admitting: Nurse Practitioner

## 2024-01-30 DIAGNOSIS — M79641 Pain in right hand: Secondary | ICD-10-CM

## 2024-02-06 ENCOUNTER — Encounter (INDEPENDENT_AMBULATORY_CARE_PROVIDER_SITE_OTHER): Admitting: Internal Medicine

## 2024-02-06 DIAGNOSIS — G4733 Obstructive sleep apnea (adult) (pediatric): Secondary | ICD-10-CM

## 2024-02-07 ENCOUNTER — Ambulatory Visit: Attending: Cardiovascular Disease

## 2024-02-08 ENCOUNTER — Ambulatory Visit

## 2024-02-08 DIAGNOSIS — R0609 Other forms of dyspnea: Secondary | ICD-10-CM | POA: Diagnosis present

## 2024-02-08 LAB — ECHOCARDIOGRAM COMPLETE
AR max vel: 1.78 cm2
AV Area VTI: 1.82 cm2
AV Area mean vel: 1.74 cm2
AV Mean grad: 7 mmHg
AV Peak grad: 13 mmHg
Ao pk vel: 1.8 m/s
Area-P 1/2: 3.34 cm2
S' Lateral: 3.7 cm

## 2024-02-12 NOTE — Procedures (Signed)
 [facility]  Polysomnogram Report Part I  Phone: [phone] Fax: [phone]  Patient Name: Brianna Gray, Brianna Gray Acquisition Number: [id number]  Date of Birth: 1950/03/28 Acquisition Date: 02/06/2024  Referring Physician: Sigrid Bathe, MD     History: The patient is a 73 year old  . Medical History: Asthma, cardiomyopathy carpal tunnel syndrome, dizziness, edema, elevated lipids, GERD, hypertension, lower extremity edema, seasonal allergies, OSA, wheezing.  Medications: Accu-chek softclix lancets, acetic acid -hydrocortisone ,amlodipine, atorvastatin , augmented betamethasone  dipropionate, blood glucose monitoring, celecoxib , dulaglutide , ergocalciferol , esomeprazole , fluticasone , gentamicin  cream, glucose blood, hydralazine , ipratropium-albuterol , levothyroxine , linaclotide , loratadine , losartan -hydrochlorothiazide , meclizine , montelukast , neomycin -polymyxin-hydrocortisone , pneumococcal 20-valent cinjugate vaccine, potassium, torsemide , umeclidinium-vilanterol, vitamin b-12, zoster vaccine adjuvanted.  Procedure: This routine overnight polysomnogram was performed on the [facility] 5 using the standard CPAP protocol. This included 6 channels of EEG, 2 channels of EOG, chin EMG, bilateral anterior tibialis EMG, nasal/oral thermistor, PTAF (nasal pressure transducer), chest and abdominal wall movements, EKG, and pulse oximetry.  Description: The total recording time was 357.0 minutes. The total sleep time was 238.0 minutes. There were a total of 53.5 minutes of wakefulness after sleep onset for areducedsleep efficiency of 66.7%. The latency to sleep onset was prolonged at 65.5 minutes. The R sleep onset latency was short at 59.0 minutes. Sleep parameters, as a percentage of the total sleep time, demonstrated 14.1% of sleep was in N1 sleep, 72.9% N2, 0.0% N3 and 13.0% R sleep. There were a total of 149 arousals for an arousal index of 37.6 arousals per hour of sleep that was elevated.  Overall, there  were a total of 6 respiratory events for a respiratory disturbance index, which includes apneas, hypopneas and RERAs (increased respiratory effort) of 1.5 respiratory events per hour of sleep during the pressure titration.  was initiated at 4 cm H2O at lights out, 11:32 p.m. It was titrated in 1 cm increments for rare hypopneas to the final pressure of 7 cm H2O. The apnea was controlled at the final pressure and supine and REM sleep were observed.  Additionally, the baseline oxygen saturation during wakefulness was 95%, during NREM sleep averaged 95%, and during REM sleep averaged 93%. The total duration of oxygen < 90% was 3.5 minutes.  Cardiac monitoring-  significant cardiac rhythm irregularities.   Periodic limb movement monitoring- demonstrated that there were 252 periodic limb movements for a periodic limb movement index of 63.5 periodic limb movements per hour of sleep.   Impression: This patient's obstructive sleep apnea demonstrated significant improvement with the utilization of nasal  at 7 cm H2O.   There was a highly elevated periodic limb movement index of 63.5 periodic limb movements per hour of sleep. Sometimes these limb movements appear transiently with the initiation of CPAP, however the patient reports symptoms suggestive of restless leg syndrome. Treatment may be indicated if symptoms persist once the patient is fully compliant with CPAP.  Recommendations: Would recommend utilization of nasal  at 7 cm H2O.      Evaluation for possible restless leg syndrome is suggested. An AirFit P10 mask, size Medium, was used. Chin strap used during study- No. Humidifier used during study- Yes.     Elfreda RONAL Bathe, MD, [facility] Diplomate ABMS-Pulmonary, Critical Care and Sleep Medicine  Electronically reviewed and digitally signed   [facility] CPAP/BIPAP Polysomnogram Report Part II Phone: [phone] Fax: [phone]  Patient last name Gray Neck Size 17.0 in.  Acquisition [id number]  Patient first name Brianna Weight 257.0 lbs. Started 02/06/2024 at 11:26:45 PM  Birth date  1950/04/14 Height 63.0 in. Stopped 02/07/2024 at 5:30:15 AM  Age 47      Type Adult BMI 45.5 lb/in2 Duration 357.0  Raford Sprang. RPSGT. // Daril Burr   Reviewed by: Marval MATSU. Henke, PhD, ABSM, FAASM Sleep Data: Lights Out: 11:32:45 PM Sleep Onset: 12:38:15 AM  Lights On: 5:29:45 AM Sleep Efficiency: 66.7 %  Total Recording Time: 357.0 min Sleep Latency (from Lights Off) 65.5 min  Total Sleep Time (TST): 238.0 min R Latency (from Sleep Onset): 59.0 min  Sleep Period Time: 291.5 min Total number of awakenings: 8  Wake during sleep: 53.5 min Wake After Sleep Onset (WASO): 53.5 min   Sleep Data:         Arousal Summary: Stage  Latency from lights out (min) Latency from sleep onset (min) Duration (min) % Total Sleep Time  Normal values  N 1 65.5 0.0 33.5 14.1 (5%)  N 2 67.0 1.5 173.5 72.9 (50%)  N 3       0.0 0.0 (20%)  R 124.5 59.0 31.0 13.0 (25%)   Number Index  Spontaneous 42 10.6  Apneas & Hypopneas 1 0.3  RERAs 0 0.0       (Apneas & Hypopneas & RERAs)  (1) (0.3)  Limb Movement 108 27.2  Snore 0 0.0  TOTAL 151 38.1     Respiratory Data:  CA OA MA Apnea Hypopnea* A+ H RERA Total  Number 0 0 0 0 6 6 0 6  Mean Dur (sec) 0.0 0.0 0.0 0.0 23.6 23.6 0.0 23.6  Max Dur (sec) 0.0 0.0 0.0 0.0 31.0 31.0 0.0 31.0  Total Dur (min) 0.0 0.0 0.0 0.0 2.4 2.4 0.0 2.4  % of TST 0.0 0.0 0.0 0.0 1.0 1.0 0.0 1.0  Index (#/h TST) 0.0 0.0 0.0 0.0 1.5 1.5 0.0 1.5  *Hypopneas scored based on 4% or greater desaturation.  Sleep Stage:         REM NREM TST  AHI 11.6 0.0 1.5  RDI 11.6 0.0 1.5   Sleep (min) TST (%) REM (min) NREM (min) CA (#) OA (#) MA (#) HYP (#) AHI (#/h) RERA (#) RDI (#/h) Desat (#)  Supine 24.0 10.08 0.0 24.0 0 0 0 0 0.0 0 0.00 0  Non-Supine 214.00 89.92 31.00 183.00 0.00 0.00 0.00 6.00 1.68 0 1.68 13.00  Left: 0.0 0.00 0.0 0.0 0 0 0 0 0.0 0 0.00 0   Right: 214.0 89.92 31.0 183.0 0 0 0 6 1.7 0 1.7 13  UP: 0.0 0.00 0.0 0.0 0 0 0 0 0.0 0 0.00 0     Snoring: Total number of snoring episodes  0  Total time with snoring    min (   % of sleep)   Oximetry Distribution:             WK REM NREM TOTAL  Average (%)   95 93 95 95  < 90% 1.6 1.9 0.0 3.5  < 80% 1.6 0.0 0.0 1.6  < 70% 1.6 0.0 0.0 1.6  # of Desaturations* 0 8 5 13   Desat Index (#/hour) 0.0 15.5 1.4 3.3  Desat Max (%) 0 8 6 8   Desat Max Dur (sec) 0.0 50.0 88.0 88.0  Approx Min O2 during sleep 86  Approx min O2 during a respiratory event 86  Was Oxygen added (Y/N) and final rate :    LPM  *Desaturations based on 4% or greater drop from baseline.   Cheyne Stokes Breathing: None Present  Heart  Rate Summary:  Average Heart Rate During Sleep 69.7 bpm      Highest Heart Rate During Sleep (95th %) 148.0 bpm      Highest Heart Rate During Sleep 222 bpm (artifact)  Highest Heart Rate During Recording (TIB) 222 bpm (artifact)   Heart Rate Observations: Event Type # Events   Bradycardia 0 Lowest HR Scored: N/A  Sinus Tachycardia During Sleep 0 Highest HR Scored: N/A  Narrow Complex Tachycardia 0 Highest HR Scored: N/A  Wide Complex Tachycardia 0 Highest HR Scored: N/A  Asystole 0 Longest Pause: N/A  Atrial Fibrillation 0 Duration Longest Event: N/A  Other Arrythmias   Type:   Periodic Limb Movement Data: (Primary legs unless otherwise noted) Total # Limb Movement 253 Limb Movement Index 63.8  Total # PLMS 253 PLMS Index 63.8  Total # PLMS Arousals 108 PLMS Arousal Index 27.2  Percentage Sleep Time with PLMS 146. (61.6 % sleep)  Mean Duration limb movements (secs) 1467.2    IPAP Level (cmH2O) EPAP Level (cmH2O) Total Duration (min) Sleep Duration (min) Sleep (%) REM (%) CA  #) OA # MA # HYP #) AHI (#/hr) RERAs # RERAs (#/hr) RDI (#/hr)  4 4 3.0 3.0 100.0 0.0 0 0 0 0 0.0 0 0.0 0.0  5 5 59.9 58.9 98.3 7.0 0 0 0 4 4.1 0 0.0 4.1  6 6  117.5 104.0 88.5 20.4 0 0 0 2 1.2 0  0.0 1.2  7 7  93.4 71.1 76.1 2.2 0 0 0 0 0.0 0 0.0 0.0

## 2024-02-19 ENCOUNTER — Other Ambulatory Visit: Payer: Self-pay | Admitting: Internal Medicine

## 2024-02-19 DIAGNOSIS — G4733 Obstructive sleep apnea (adult) (pediatric): Secondary | ICD-10-CM

## 2024-02-19 DIAGNOSIS — K7581 Nonalcoholic steatohepatitis (NASH): Secondary | ICD-10-CM

## 2024-02-19 DIAGNOSIS — Z6841 Body Mass Index (BMI) 40.0 and over, adult: Secondary | ICD-10-CM

## 2024-02-27 ENCOUNTER — Encounter: Payer: Self-pay | Admitting: Internal Medicine

## 2024-03-05 ENCOUNTER — Other Ambulatory Visit: Payer: Self-pay | Admitting: Internal Medicine

## 2024-03-05 ENCOUNTER — Other Ambulatory Visit: Payer: Self-pay | Admitting: Nurse Practitioner

## 2024-03-05 DIAGNOSIS — E1140 Type 2 diabetes mellitus with diabetic neuropathy, unspecified: Secondary | ICD-10-CM

## 2024-03-18 ENCOUNTER — Ambulatory Visit: Admitting: Internal Medicine

## 2024-03-18 ENCOUNTER — Encounter: Payer: Self-pay | Admitting: Internal Medicine

## 2024-03-18 ENCOUNTER — Telehealth: Payer: Self-pay | Admitting: Internal Medicine

## 2024-03-18 VITALS — BP 119/80 | HR 63 | Temp 96.5°F | Resp 16 | Ht 63.0 in | Wt 256.0 lb

## 2024-03-18 DIAGNOSIS — E66813 Obesity, class 3: Secondary | ICD-10-CM

## 2024-03-18 DIAGNOSIS — M25562 Pain in left knee: Secondary | ICD-10-CM

## 2024-03-18 DIAGNOSIS — G8929 Other chronic pain: Secondary | ICD-10-CM

## 2024-03-18 DIAGNOSIS — Z6841 Body Mass Index (BMI) 40.0 and over, adult: Secondary | ICD-10-CM

## 2024-03-18 DIAGNOSIS — G4761 Periodic limb movement disorder: Secondary | ICD-10-CM

## 2024-03-18 DIAGNOSIS — E1169 Type 2 diabetes mellitus with other specified complication: Secondary | ICD-10-CM

## 2024-03-18 DIAGNOSIS — M25561 Pain in right knee: Secondary | ICD-10-CM

## 2024-03-18 DIAGNOSIS — G4733 Obstructive sleep apnea (adult) (pediatric): Secondary | ICD-10-CM

## 2024-03-18 NOTE — Telephone Encounter (Signed)
 Cpap replacement order emailed to [facility]

## 2024-03-18 NOTE — Progress Notes (Signed)
 [facility] [address] [place], [place] [address]  Internal MEDICINE  Office Visit Note  Patient Name: Brianna Gray  [id number]  [id number]  Date of Service: 03/18/2024  Chief Complaint  Patient presents with   Follow-up    Review SS    HPI Pt is seen for routine follow up Abnormal CPAP titration, will need a CPAP machine  Has seen ortho for possible knee replacement but needs to lose more weight  Does have abnormal limb movement during sleep study  Seen by cardiology for diastolic dysfunction     Current Medication: Outpatient Encounter Medications as of 03/18/2024  Medication Sig   ACCU-CHEK GUIDE TEST test strip USE AS INSTRUCTED TO CHECK BLOOD SUGARS TWICE A DAY E11.65   Accu-Chek Softclix Lancets lancets USE TO CHECK BLOOD SUGARS TWICE A DAY. E11.65   acetic acid -hydrocortisone  (VOSOL -HC) OTIC solution Place 3 drops into both ears 2 (two) times daily as needed (itchy dry ears).   ADVAIR  DISKUS 250-50 MCG/ACT AEPB INHALE 1 PUFF INTO THE LUNGS 2 (TWO) TIMES DAILY AS NEEDED. FOR RESPIRATORY ISSUES.   amLODipine (NORVASC) 5 MG tablet Take 5 mg by mouth daily.   atorvastatin  (LIPITOR) 10 MG tablet Take 1 tablet (10 mg total) by mouth daily.   augmented betamethasone  dipropionate (DIPROLENE ) 0.05 % ointment Apply topically 2 (two) times daily.   Blood Glucose Monitoring Suppl (ACCU-CHEK GUIDE ME) w/Device KIT Use to check blood sugars twice a day.    E11.65   celecoxib  (CELEBREX ) 200 MG capsule TAKE 1 CAPSULE BY MOUTH TWICE A DAY   Cholecalciferol (VITAMIN D -3) 25 MCG (1000 UT) CAPS Take 1,000 capsules by mouth daily.   ergocalciferol  (VITAMIN D2) 1.25 MG (50000 UT) capsule Take 1 capsule (50,000 Units total) by mouth once a week.   esomeprazole  (NEXIUM ) 40 MG capsule TAKE 1 CAPSULE BY MOUTH EVERY DAY FOR STOMACH   fluticasone  (FLONASE ) 50 MCG/ACT nasal spray Place 1-2 sprays into both nostrils daily as needed. For nasal congestion.   gabapentin (NEURONTIN) 300  MG capsule Take 300 mg by mouth at bedtime.   gentamicin  cream (GARAMYCIN ) 0.1 % Apply 1 application topically 2 (two) times daily.   hydrALAZINE  (APRESOLINE ) 25 MG tablet TAKE 1 TABLET BY MOUTH THREE TIMES A DAY   ipratropium-albuterol  (DUONEB) 0.5-2.5 (3) MG/3ML SOLN One vial tid prn for asthma, faillure to MDI   levothyroxine  (SYNTHROID ) 88 MCG tablet TAKE 1 TABLET BY MOUTH EVERY MORNING ON EMPTY STOMACH**OK TO CHANGE TO LANNETT PER MD**   linaclotide  (LINZESS ) 72 MCG capsule Take one tab po every day for constipation   loratadine  (CLARITIN ) 10 MG tablet TAKE 1 TABLET BY MOUTH EVERY DAY   losartan -hydrochlorothiazide  (HYZAAR) 100-25 MG tablet Take 1 tablet by mouth daily.   meclizine  (ANTIVERT ) 25 MG tablet Take 1 tablet (25 mg total) by mouth 2 (two) times daily as needed for dizziness.   montelukast  (SINGULAIR ) 10 MG tablet Take 1 tablet (10 mg total) by mouth at bedtime. For sinus congestion and allergies   neomycin -polymyxin-hydrocortisone  (CORTISPORIN) OTIC solution 3-4 drops in both ears bid prn   Potassium 99 MG TABS Take 99 mg by mouth daily at 12 noon.   tirzepatide  (MOUNJARO ) 5 MG/0.5ML Pen Inject 5 mg into the skin once a week.   torsemide  (DEMADEX ) 20 MG tablet Take 1 tablet (20 mg total) by mouth daily. (Patient taking differently: Take 20 mg by mouth every other day.)   umeclidinium-vilanterol (ANORO ELLIPTA ) 62.5-25 MCG/ACT AEPB Inhale 1 puff into the lungs  daily at 6 (six) AM.   vitamin B-12 (CYANOCOBALAMIN) 1000 MCG tablet Take 1,000 mcg by mouth daily at 12 noon.   No facility-administered encounter medications on file as of 03/18/2024.    Surgical History: Past Surgical History:  Procedure Laterality Date   CHOLECYSTECTOMY     HERNIA REPAIR     HERNIA REPAIR     HYSTEROSCOPY WITH D & C N/A 03/19/2015   Procedure: DILATATION AND CURETTAGE /HYSTEROSCOPY and polypectomy;  Surgeon: Mitzie BROCKS Ward, MD;  Location: [facility] ORS;  Service: Gynecology;  Laterality: N/A;    LAPAROSCOPIC HYSTERECTOMY N/A 06/08/2016   Procedure: HYSTERECTOMY TOTAL LAPAROSCOPIC;  Surgeon: Lamar SHAUNNA Lesches, MD;  Location: [facility] ORS;  Service: Gynecology;  Laterality: N/A;   LAPAROSCOPIC SALPINGO OOPHERECTOMY Bilateral 06/08/2016   Procedure: LAPAROSCOPIC SALPINGO OOPHORECTOMY;  Surgeon: Lamar SHAUNNA Lesches, MD;  Location: [facility] ORS;  Service: Gynecology;  Laterality: Bilateral;   LAPAROTOMY N/A 06/08/2016   Procedure: LAPAROTOMY;  Surgeon: Lamar SHAUNNA Lesches, MD;  Location: [facility] OR[facility];  Service: Gynecology;  Laterality: N/A;    Medical History: Past Medical History:  Diagnosis Date   Asthma    Cardiomyopathy (HCC)    Carpal tunnel syndrome    Carpal tunnel syndrome    Diabetes mellitus without complication (HCC)    Dizziness    Edema    Elevated lipids    GERD (gastroesophageal reflux disease)    Hyperlipidemia    Hypertension    Lower extremity edema    Seasonal allergies    Sleep apnea    Thyroid disease    Wheezing     Family History: Family History  Problem Relation Age of Onset   Heart Problems Mother    Diabetes Brother    Hypertension Brother    Breast cancer Neg Hx     Social History   Socioeconomic History   Marital status: Married    Spouse name: Not on file   Number of children: Not on file   Years of education: Not on file   Highest education level: Not on file  Occupational History   Not on file  Tobacco Use   Smoking status: Never   Smokeless tobacco: Current    Types: Chew  Vaping Use   Vaping status: Never Used  Substance and Sexual Activity   Alcohol use: No   Drug use: No   Sexual activity: Never  Other Topics Concern   Not on file  Social History Narrative   ** Merged History Encounter **       Social Drivers of Health   Tobacco Use: High Risk (03/18/2024)   Patient History    Smoking Tobacco Use: Never    Smokeless Tobacco Use: Current    Passive Exposure: Not on file  Financial Resource Strain: Low Risk  (02/12/2024)   Received  from [facility]   Overall Financial Resource Strain (CARDIA)    Difficulty of Paying Living Expenses: Not very hard  Food Insecurity: No Food Insecurity (02/12/2024)   Received from [facility]   Epic    Within the past 12 months, you worried that your food would run out before you got the money to buy more.: Never true    Within the past 12 months, the food you bought just didn't last and you didn't have money to get more.: Never true  Transportation Needs: No Transportation Needs (02/12/2024)   Received from [facility] - Transportation    In  the past 12 months, has lack of transportation kept you from medical appointments or from getting medications?: No    Lack of Transportation (Non-Medical): No  Physical Activity: Not on file  Stress: Not on file  Social Connections: Not on file  Intimate Partner Violence: Not on file  Depression (PHQ2-9): Low Risk (01/08/2024)   Depression (PHQ2-9)    PHQ-2 Score: 0  Alcohol Screen: Low Risk (10/12/2021)   Alcohol Screen    Last Alcohol Screening Score (AUDIT): 0  Housing: Low Risk  (02/12/2024)   Received from [facility]   Epic    In the last 12 months, was there a time when you were not able to pay the mortgage or rent on time?: No    In the past 12 months, how many times have you moved where you were living?: 0    At any time in the past 12 months, were you homeless or living in a shelter (including now)?: No  Utilities: Not At Risk (02/12/2024)   Received from [facility]   Epic    In the past 12 months has the electric, gas, oil, or water company threatened to shut off services in your home?: No  Health Literacy: Not on file      Review of Systems  Constitutional:  Negative for chills, fatigue, fever and unexpected weight change.  HENT:  Negative for congestion, mouth sores, postnasal drip, rhinorrhea, sneezing and sore throat.    Eyes:  Negative for redness.  Respiratory:  Negative for cough, chest tightness and shortness of breath.   Cardiovascular:  Negative for chest pain and palpitations.  Gastrointestinal:  Negative for abdominal pain, constipation, diarrhea, nausea and vomiting.  Genitourinary:  Negative for dysuria, flank pain and frequency.  Musculoskeletal:  Positive for arthralgias and gait problem. Negative for back pain, joint swelling and neck pain.  Skin:  Negative for rash.  Neurological:  Negative for tremors and numbness.  Hematological:  Negative for adenopathy. Does not bruise/bleed easily.  Psychiatric/Behavioral: Negative.  Negative for behavioral problems (Depression), sleep disturbance and suicidal ideas. The patient is not nervous/anxious.     Vital Signs: BP 119/80   Pulse 63   Temp (!) 96.5 F (35.8 C)   Resp 16   Ht 5' 3 (1.6 m)   Wt 256 lb (116.1 kg)   SpO2 98%   BMI 45.35 kg/m    Physical Exam Constitutional:      Appearance: Normal appearance.  HENT:     Head: Normocephalic and atraumatic.     Nose: Nose normal.     Mouth/Throat:     Mouth: Mucous membranes are moist.     Pharynx: No posterior oropharyngeal erythema.  Eyes:     Extraocular Movements: Extraocular movements intact.     Pupils: Pupils are equal, round, and reactive to light.  Cardiovascular:     Rate and Rhythm: Normal rate and regular rhythm.     Pulses: Normal pulses.     Heart sounds: Normal heart sounds.  Pulmonary:     Effort: Pulmonary effort is normal.     Breath sounds: Normal breath sounds.  Musculoskeletal:        General: Deformity present. No swelling.     Cervical back: Normal range of motion.  Neurological:     General: No focal deficit present.     Mental Status: She is alert.  Psychiatric:        Mood and Affect: Mood normal.  Behavior: Behavior normal.        Assessment/Plan: 1. OSA (obstructive sleep apnea) (Primary) Pt will get her CPAP machine and other  supplies  - For home use only DME continuous positive airway pressure (CPAP)  2. Periodic limb movements of sleep Might need to be on requip, will get /pulmonary/ sleep to see her  - Ambulatory referral to Pulmonology  3. Class 3 severe obesity with serious comorbidity and body mass index (BMI) of 40.0 to 44.9 in adult, unspecified obesity type (HCC) Obesity Counseling: Risk Assessment: An assessment of behavioral risk factors was made today and includes lack of exercise sedentary lifestyle, lack of portion control and poor dietary habits.  Risk Modification Advice: She was counseled on portion control guidelines. Restricting daily caloric intake to 1200. The detrimental long term effects of obesity on her health and ongoing poor compliance was also discussed with the patient.    4. Type 2 diabetes mellitus with other specified complication, without long-term current use of insulin (HCC) Controlled   5. Chronic pain of both knees Will need knee replacement    General Counseling: Kyana verbalizes understanding of the findings of todays visit and agrees with plan of treatment. I have discussed any further diagnostic evaluation that may be needed or ordered today. We also reviewed her medications today. she has been encouraged to call the office with any questions or concerns that should arise related to todays visit.    Orders Placed This Encounter  Procedures   For home use only DME continuous positive airway pressure (CPAP)   Ambulatory referral to Pulmonology      Total time spent:35 Minutes Time spent includes review of chart, medications, test results, and follow up plan with the patient.   [place] Controlled Substance Database was reviewed by me.   Dr Ranald Alessio M [facility] Internal medicine

## 2024-03-21 ENCOUNTER — Telehealth: Payer: Self-pay | Admitting: Internal Medicine

## 2024-03-21 NOTE — Telephone Encounter (Signed)
 Corrected CMN for cpap replacement emailed to [facility]-Toni

## 2024-03-24 ENCOUNTER — Ambulatory Visit: Admitting: Internal Medicine

## 2024-03-26 ENCOUNTER — Ambulatory Visit: Admitting: Cardiovascular Disease

## 2024-03-27 ENCOUNTER — Telehealth: Payer: Self-pay | Admitting: Internal Medicine

## 2024-03-27 NOTE — Telephone Encounter (Signed)
 Received Cpap CMN from [facility]. Gave to [facility] for signature-Toni

## 2024-03-31 ENCOUNTER — Ambulatory Visit: Admitting: Internal Medicine

## 2024-04-01 ENCOUNTER — Ambulatory Visit: Admitting: Cardiovascular Disease

## 2024-04-01 ENCOUNTER — Encounter: Payer: Self-pay | Admitting: Cardiovascular Disease

## 2024-04-01 ENCOUNTER — Telehealth: Payer: Self-pay | Admitting: Internal Medicine

## 2024-04-01 VITALS — BP 120/60 | HR 68 | Ht 63.0 in | Wt 260.0 lb

## 2024-04-01 DIAGNOSIS — I10 Essential (primary) hypertension: Secondary | ICD-10-CM | POA: Diagnosis not present

## 2024-04-01 DIAGNOSIS — E782 Mixed hyperlipidemia: Secondary | ICD-10-CM

## 2024-04-01 DIAGNOSIS — I2510 Atherosclerotic heart disease of native coronary artery without angina pectoris: Secondary | ICD-10-CM

## 2024-04-01 MED ORDER — ASPIRIN 81 MG PO TBEC: 81.0000 mg | DELAYED_RELEASE_TABLET | Freq: Every day | ORAL | Status: AC

## 2024-04-01 MED ORDER — ATORVASTATIN CALCIUM 40 MG PO TABS: 40.0000 mg | ORAL_TABLET | Freq: Every day | ORAL | 3 refills | Status: AC

## 2024-04-01 NOTE — Telephone Encounter (Signed)
 CMN signed & faxed back to [facility]; [phone]. Scanned-Toni

## 2024-04-01 NOTE — Patient Instructions (Signed)
 Medication Instructions:  START Aspirin  81 mg once daily  INCREASE the Atorvastatin  40 mg once daily  *If you need a refill on your cardiac medications before your next appointment, please call your pharmacy*  Lab Work: None ordered If you have labs (blood work) drawn today and your tests are completely normal, you will receive your results only by: MyChart Message (if you have MyChart) OR A paper copy in the mail If you have any lab test that is abnormal or we need to change your treatment, we will call you to review the results.  Testing/Procedures: None ordered  Follow-Up: At [facility], you and your health needs are our priority.  As part of our continuing mission to provide you with exceptional heart care, our providers are all part of one team.  This team includes your primary Cardiologist (physician) and Advanced Practice Providers or APPs (Physician Assistants and Nurse Practitioners) who all work together to provide you with the care you need, when you need it.  Your next appointment:   6 month(s)  Provider:   You may see Deatrice Cage, MD or one of the following Advanced Practice Providers on your designated Care Team:   Lonni Meager, NP Lesley Maffucci, PA-C Bernardino Bring, PA-C Cadence [place], PA-C Tylene Lunch, NP Barnie Hila, NP    We recommend signing up for the patient portal called MyChart.  Sign up information is provided on this After Visit Summary.  MyChart is used to connect with patients for Virtual Visits (Telemedicine).  Patients are able to view lab/test results, encounter notes, upcoming appointments, etc.  Non-urgent messages can be sent to your provider as well.   To learn more about what you can do with MyChart, go to [email or web address].

## 2024-04-01 NOTE — Telephone Encounter (Signed)
 Emailed correct CMN showing pressure of 7 to [place] w/ [facility]. Scanned-Toni

## 2024-04-15 ENCOUNTER — Ambulatory Visit: Admitting: Internal Medicine

## 2024-07-15 ENCOUNTER — Ambulatory Visit: Admitting: Internal Medicine

## 2024-11-06 ENCOUNTER — Encounter: Admitting: Nurse Practitioner

## 2025-01-07 ENCOUNTER — Encounter: Admitting: Internal Medicine
